# Patient Record
Sex: Male | Born: 1950 | Race: Black or African American | Hispanic: No | Marital: Single | Smoking: Never smoker
Health system: Southern US, Community
[De-identification: ages and names within clinical notes are randomized; demographics above are authoritative.]

## PROBLEM LIST (undated history)

## (undated) DIAGNOSIS — I10 Essential (primary) hypertension: Secondary | ICD-10-CM

## (undated) DIAGNOSIS — K219 Gastro-esophageal reflux disease without esophagitis: Secondary | ICD-10-CM

## (undated) DIAGNOSIS — E559 Vitamin D deficiency, unspecified: Secondary | ICD-10-CM

## (undated) DIAGNOSIS — F329 Major depressive disorder, single episode, unspecified: Secondary | ICD-10-CM

## (undated) DIAGNOSIS — F32A Depression, unspecified: Secondary | ICD-10-CM

## (undated) DIAGNOSIS — N189 Chronic kidney disease, unspecified: Secondary | ICD-10-CM

## (undated) DIAGNOSIS — R1310 Dysphagia, unspecified: Secondary | ICD-10-CM

## (undated) DIAGNOSIS — I639 Cerebral infarction, unspecified: Secondary | ICD-10-CM

## (undated) DIAGNOSIS — G10 Huntington's disease: Secondary | ICD-10-CM

## (undated) DIAGNOSIS — T7840XA Allergy, unspecified, initial encounter: Secondary | ICD-10-CM

## (undated) DIAGNOSIS — E785 Hyperlipidemia, unspecified: Secondary | ICD-10-CM

## (undated) HISTORY — DX: Gastro-esophageal reflux disease without esophagitis: K21.9

## (undated) HISTORY — DX: Essential (primary) hypertension: I10

## (undated) HISTORY — DX: Major depressive disorder, single episode, unspecified: F32.9

## (undated) HISTORY — DX: Vitamin D deficiency, unspecified: E55.9

## (undated) HISTORY — DX: Dysphagia, unspecified: R13.10

## (undated) HISTORY — DX: Allergy, unspecified, initial encounter: T78.40XA

## (undated) HISTORY — DX: Hyperlipidemia, unspecified: E78.5

## (undated) HISTORY — DX: Chronic kidney disease, unspecified: N18.9

## (undated) HISTORY — DX: Cerebral infarction, unspecified: I63.9

## (undated) HISTORY — DX: Huntington's disease: G10

## (undated) HISTORY — DX: Depression, unspecified: F32.A

---
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
## 2009-09-24 LAB — CONVERTED CEMR LAB
ALT: 17 units/L
Albumin: 4.2 g/dL
CO2: 26 meq/L
Calcium: 9.3 mg/dL
Creatinine, Ser: 1.14 mg/dL
HDL: 68 mg/dL
Total Bilirubin: 0.4 mg/dL
Triglyceride fasting, serum: 93 mg/dL

## 2009-11-11 ENCOUNTER — Encounter: Admission: RE | Admit: 2009-11-11 | Discharge: 2009-11-11 | Payer: Self-pay | Admitting: Family Medicine

## 2009-11-11 LAB — CONVERTED CEMR LAB
Hemoglobin: 14.5 g/dL
WBC: 4.9 10*3/uL

## 2009-11-21 ENCOUNTER — Encounter: Payer: Self-pay | Admitting: Internal Medicine

## 2009-11-21 LAB — HM COLONOSCOPY: HM Colonoscopy: NORMAL

## 2009-12-06 ENCOUNTER — Ambulatory Visit: Payer: Self-pay | Admitting: Internal Medicine

## 2009-12-06 DIAGNOSIS — K1120 Sialoadenitis, unspecified: Secondary | ICD-10-CM

## 2009-12-06 DIAGNOSIS — R197 Diarrhea, unspecified: Secondary | ICD-10-CM

## 2009-12-06 DIAGNOSIS — K219 Gastro-esophageal reflux disease without esophagitis: Secondary | ICD-10-CM

## 2009-12-06 DIAGNOSIS — J309 Allergic rhinitis, unspecified: Secondary | ICD-10-CM | POA: Insufficient documentation

## 2009-12-06 DIAGNOSIS — G10 Huntington's disease: Secondary | ICD-10-CM

## 2010-05-21 ENCOUNTER — Ambulatory Visit: Payer: Self-pay | Admitting: Internal Medicine

## 2010-06-12 ENCOUNTER — Encounter (INDEPENDENT_AMBULATORY_CARE_PROVIDER_SITE_OTHER): Payer: Self-pay | Admitting: *Deleted

## 2010-06-12 ENCOUNTER — Ambulatory Visit: Payer: Self-pay | Admitting: Internal Medicine

## 2010-06-12 LAB — CONVERTED CEMR LAB
ALT: 25 units/L (ref 0–53)
AST: 24 units/L (ref 0–37)
BUN: 15 mg/dL (ref 6–23)
Bilirubin, Direct: 0.1 mg/dL (ref 0.0–0.3)
Calcium: 9.5 mg/dL (ref 8.4–10.5)
Creatinine, Ser: 1.3 mg/dL (ref 0.4–1.5)
Eosinophils Relative: 2.6 % (ref 0.0–5.0)
GFR calc non Af Amer: 70.22 mL/min (ref >60)
Monocytes Relative: 11.4 % (ref 3.0–12.0)
Neutrophils Relative %: 43.5 % (ref 43.0–77.0)
Platelets: 220 10*3/uL (ref 150.0–400.0)
Total Bilirubin: 0.6 mg/dL (ref 0.3–1.2)
WBC: 3.9 10*3/uL — ABNORMAL LOW (ref 4.5–10.5)

## 2010-06-16 LAB — CONVERTED CEMR LAB: IgE (Immunoglobulin E), Serum: 332 intl units/mL — ABNORMAL HIGH (ref 0.0–180.0)

## 2010-06-17 ENCOUNTER — Telehealth: Payer: Self-pay | Admitting: Gastroenterology

## 2010-06-17 ENCOUNTER — Telehealth: Payer: Self-pay | Admitting: Internal Medicine

## 2010-06-23 ENCOUNTER — Telehealth: Payer: Self-pay | Admitting: Gastroenterology

## 2010-06-24 ENCOUNTER — Telehealth (INDEPENDENT_AMBULATORY_CARE_PROVIDER_SITE_OTHER): Payer: Self-pay | Admitting: *Deleted

## 2010-06-27 ENCOUNTER — Telehealth: Payer: Self-pay | Admitting: Gastroenterology

## 2010-06-27 ENCOUNTER — Ambulatory Visit: Payer: Self-pay | Admitting: Gastroenterology

## 2010-07-02 ENCOUNTER — Ambulatory Visit: Payer: Self-pay | Admitting: Gastroenterology

## 2010-07-04 ENCOUNTER — Telehealth: Payer: Self-pay | Admitting: Gastroenterology

## 2010-07-07 ENCOUNTER — Encounter (INDEPENDENT_AMBULATORY_CARE_PROVIDER_SITE_OTHER): Payer: Self-pay | Admitting: *Deleted

## 2010-07-08 ENCOUNTER — Encounter: Payer: Self-pay | Admitting: Gastroenterology

## 2010-07-17 ENCOUNTER — Ambulatory Visit: Payer: Self-pay | Admitting: Internal Medicine

## 2010-07-29 ENCOUNTER — Telehealth (INDEPENDENT_AMBULATORY_CARE_PROVIDER_SITE_OTHER): Payer: Self-pay | Admitting: *Deleted

## 2010-07-31 ENCOUNTER — Telehealth: Payer: Self-pay | Admitting: Internal Medicine

## 2010-08-14 ENCOUNTER — Ambulatory Visit: Payer: Self-pay | Admitting: Internal Medicine

## 2010-08-27 ENCOUNTER — Encounter: Payer: Self-pay | Admitting: Internal Medicine

## 2010-08-27 LAB — CONVERTED CEMR LAB: IgE (Immunoglobulin E), Serum: 251.3 intl units/mL — ABNORMAL HIGH (ref 0.0–180.0)

## 2010-09-15 ENCOUNTER — Telehealth: Payer: Self-pay | Admitting: Internal Medicine

## 2010-09-15 DIAGNOSIS — L600 Ingrowing nail: Secondary | ICD-10-CM

## 2010-12-23 NOTE — Progress Notes (Signed)
Summary: POA concern  Phone Note Call from Patient Call back at [phone]   Caller: cousin, Fraser Din Call For: Dr. Christella Hartigan Reason for Call: Talk to Nurse Summary of Call: cousin, Fraser Din, is in the process of applying for POA and in the meantime has asked that all appointments and notifications are made through him... Fraser Din was upset because apparently the pt was contacted about his appt today with Amy and pt is not able to comprehend this information per Myrtle... pt was A nos TODAY, have verified with Pam that Amy will not be charging pt a cx fee... Fraser Din would like "the nurse involved in sch'ling today's appt" to give him a call to clarify who the appt was made with and to resch another appt asap Initial call taken by: Vallarie Mare,  June 23, 2010 4:47 PM  Follow-up for Phone Call        [facility] informed we only gave the scheduler an appt. at the request of Dr.Leschbar and did not speak with pt.Dahlia the triage nurse contacted and she ask me to have the pt. call their office and let a message for triage nurse which I relayed to [facility]. Follow-up by: Teryl Lucy RN,  June 24, 2010 9:34 AM

## 2010-12-23 NOTE — Progress Notes (Signed)
Summary: Biopsy Results  Phone Note Call from Patient Call back at Home Phone [phone]   Caller: Eric Waters, caretaker Call For: Dr. Arlyce Dice Reason for Call: Talk to Nurse Summary of Call: would like to get EGD results Initial call taken by: Vallarie Mare,  July 04, 2010 2:00 PM  Follow-up for Phone Call        Message left for pt. that when Dr.Kaplan reviews and advises on biopsy results I will call him.  Follow-up by: Laureen Ochs LPN,  July 04, 2010 2:28 PM  Additional Follow-up for Phone Call Additional follow up Details #1::        bxs are normal.  needs f/u ov Additional Follow-up by: Louis Meckel MD,  July 07, 2010 11:54 AM    Additional Follow-up for Phone Call Additional follow up Details #2::    Above MD orders reviewed with patient's caregiver, Eric Waters. Pt. to keep scheduled office visit 08-06-10 at 11:15am. Eric Waters instructed to call back as needed.    Follow-up by: Laureen Ochs LPN,  July 07, 2010 12:12 PM

## 2010-12-23 NOTE — Letter (Signed)
Summary: [facility] Results Follow Up Letter  [facility]  [address]   [place], [place] [address]   Phone: [phone]  Fax: [phone]    08/27/2010 MRN: [id number]  Eric Waters [address] [place], [place]  [address]  Dear [name],  We have received the results from your recent tests and have been unable to contact you.  Please call our office at [phone] so that Dr.Young or his nurse may review the results with you.    Thank you,    Nature conservation officer Pulmonary Division

## 2010-12-23 NOTE — Letter (Signed)
Summary: Appt Reminder 2  [facility]  [address] [place], [place] [address]   Phone: [phone]  Fax: [phone]        July 07, 2010 MRN: [id number]    [facility] [address] [place], [place]  [address]    Dear Eric Waters,   You have a return appointment with Dr.Robert kaplan on 08-06-10 at 11:15am. Please remember to bring a complete list of the medicines you are taking, your insurance card and your co-pay.  If you have to cancel or reschedule this appointment, please call before 5:00 pm the evening before to avoid a cancellation fee.  If you have any questions or concerns, please call [phone].    Sincerely,    Laureen Ochs LPN  Appended Document: Appt Reminder 2 Letter mailed to patient's caregiver, Eric Waters.

## 2010-12-23 NOTE — Letter (Signed)
Summary: Results Letter  [facility]  [address] [place], [place] [address]   Phone: [phone]  Fax: [phone]        July 08, 2010 MRN: [id number]    Eric Waters [address] [place], [place]  [address]    Dear Mr. Greenawalt,  Your small bowel biopsy results did not show any remarkable findings. There is no evidence for celiac disease. Please continue with the recommendations previously discussed and schedule a followup office appointment.  Should you have any further questions or immediate concers, feel free to contact me.  Sincerely,  Barbette Hair. Arlyce Dice, M.D., [name]          Sincerely,  Louis Meckel MD  This letter has been electronically signed by your physician.  Appended Document: Results Letter letter mailed

## 2010-12-23 NOTE — Procedures (Signed)
Summary: Colon w BX/[facility]  Colon w BX/[facility]   Imported By: Lester Old [place] 12/09/2009 10:37:52  _____________________________________________________________________  External Attachment:    Type:   Image     Comment:   External Document

## 2010-12-23 NOTE — Assessment & Plan Note (Signed)
Summary: nausea, vomiting and diarrhea x 1 month/pl   History of Present Illness Visit Type: Follow-up Visit Primary GI MD: Melvia Heaps MD [facility] Primary Provider: Rene Paci, MD Chief Complaint: nausea, vomiting and diarrhea for one month, worse after drinking any liquid History of Present Illness:   PLEASANT 60 YO A.A MALE  REFERRED FOR CHRONIC DIARRHEA. HE HAS HUNTINGTON'S DISEASE AND IS ACCOMPANIED BY HIS COUSIN WHO IS HIS POA.Marland Kitchen HE HAD A COLONOSCOPY WITH DR. HUNG IN 12/10 FOR DIARRHEA WHICH WAS NORMAL. HE DID RANDOME BX'S -NEGATIVE.  PT IS NOT SURE WHEN HIS DIARRHEA STARTED BUT IT HAS BEEN PRESENT FOR THE BETTER PART OF A YEAR AND PROGRESSIVE. HE IS USING 4-5 IMMODIUM PER DAY AND STIIL HAS 2-3 LOOSE STOOLS PER DAY. NO BLEEDING. OVER THE PAST MONTH HAS BEN C/O ABDOMINAL CRAMPINGAND EPISODES OF NAUSEA AND VOMITING WHICH HE SEEMS TO ASSOCITE WITH DRINKINGH TOO MUCH FLUID. NO SIGNIFICANT WEIGHT LOSS.   HE SA DR LESCHBER RECENTLY WHO DID AAN ALLERGY WORKUP. HE WAS POSITIVE FOR WHEAT ,AND IGE. CELIAC PANEL WAS ORDERED. HE HAS A POSITIVE GLIADIN AB IGG-20.8,,AND GLIADIN AB IGA-24.2,CBC AND CMET UNREMARKABLE,TSH NORMAL.   GI Review of Systems    Reports abdominal pain, bloating, nausea, and  vomiting.     Location of  Abdominal pain: lower abdomen.    Denies acid reflux, belching, chest pain, dysphagia with liquids, dysphagia with solids, heartburn, loss of appetite, vomiting blood, and  weight loss.      Reports diarrhea.     Denies anal fissure, black tarry stools, change in bowel habit, constipation, diverticulosis, fecal incontinence, heme positive stool, hemorrhoids, irritable bowel syndrome, jaundice, light color stool, liver problems, rectal bleeding, and  rectal pain. Preventive Screening-Counseling & Management  Caffeine-Diet-Exercise     Does Patient Exercise: no      Drug Use:  no.      Current Medications (verified): 1)  Imodium A-D 2 Mg Tabs (Loperamide Hcl) .... Take  1-2 By Mouth Every 4-6 Hours As Needed For Diarrhea, Max 8 Tab/24h 2)  Claritin 10 Mg Tabs (Loratadine) .Marland Kitchen.. 1 By Mouth Two Times A Day 3)  St Johns Wort 150 Mg Caps ([address] Johns Wort) .... Take 1 Capsule By Mouth Once A Day 4)  Echinacea 80 Mg Caps (Echinacea) .... Take 1 Capsule By Mouth Once A Day  Allergies (verified): 1)  ! Codeine  Past History:  Past Medical History: Allergic rhinitis GERD huntington's chorea - [facility] for [facility]   MD roster - neuro - dr. Boris Sharper christine research coordinator [phone]  Past Surgical History: Tonsillectomy (1959) COLONOSCOPY 12/10 DR. HUNG NORMAL.  Family History: Maternal uncle alzheimer Maternal grandfather alzheimer Maternal grandmother hx huntington chorea Maternal uncle hx stroke Family History Lung cancer (father) No FH of Colon Cancer:  Social History: Never Smoked no alcohol use single, separated from wife since 2003 lives with friend of family moved to [place] area from [place] spring [place] to be closer to [facility] and caregivers Illicit Drug Use - no Patient does not get regular exercise.  Drug Use:  no Does Patient Exercise:  no  Review of Systems  The patient denies allergy/sinus, anemia, anxiety-new, arthritis/joint pain, back pain, blood in urine, breast changes/lumps, confusion, cough, coughing up blood, depression-new, fainting, fatigue, fever, headaches-new, hearing problems, heart murmur, heart rhythm changes, itching, muscle pains/cramps, night sweats, nosebleeds, shortness of breath, skin rash, sleeping problems, sore throat, swelling of feet/legs, swollen lymph glands, thirst - excessive, urination - excessive, urination  changes/pain, urine leakage, vision changes, and voice change.         SEE HPI  Vital Signs:  Patient profile:   60 year old male Height:      72 inches Weight:      172 pounds BMI:     23.41 Pulse rate:   76 / minute Pulse rhythm:   regular BP sitting:   114 / 66  (left arm) Cuff  size:   regular  Vitals Entered By: Francee Piccolo CMA Duncan Dull) (June 27, 2010 3:45 PM)  Physical Exam  General:  Well developed, well nourished, no acute distress.THIN WITH MOVEMENT DISORDER Head:  Normocephalic and atraumatic. Eyes:  PERRLA, no icterus. Lungs:  Clear throughout to auscultation. Heart:  Regular rate and rhythm; no murmurs, rubs,  or bruits. Abdomen:  SOFT, NONTENDER, NO MASS OR HSM,BS+ Rectal:  NOT DONE Extremities:  No clubbing, cyanosis, edema or deformities noted. Neurologic:  Alert and  oriented x4; WITH MOVEMENT DISORDER Psych:  Alert and cooperative. Normal mood and affect.   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 R/O CELIAC DISEASE.  + GLIADIN AB'S,NEG TTG, + WHEAT SENSITIVITY WITH FOOD ALLERGEN PROFILE.  CONTINUE IMMODIUM AS NEEDED ADD BENTYL 10 MG 3 X DAILY SCHEDULE FOR EGD WITH SMALL BOWEL BX'S WITH DR. KAPLAN. PROCEDURE DISCUSSED IN DETAIL WITH PT AND HIS POA.   Orders: EGD (EGD)  Patient Instructions: 1)  We scheduled the Endoscopy with Dr. Arlyce Dice for 07-02-10. 2)  Directions and brochure provided. 3)  [facility] Patient Information Guide given to patient. 4)  We sent a  prescriptions for Bentyl 10 mg . 5)  Continue Imodium as needed. 6)  Copy sent to : Otho Najjar, MD 7)  The medication list was reviewed and reconciled.  All changed / newly prescribed medications were explained.  A complete medication list was provided to the patient / caregiver. Prescriptions: BENTYL 10 MG CAPS (DICYCLOMINE HCL) Take 1 tab 3 times daily as needed for cramping  #60 x 1   Entered by:   Lowry Ram NCMA   Authorized by:   Sammuel Cooper PA-c   Signed by:   Lowry Ram NCMA on 06/27/2010   Method used:   Electronically  to        [facility]  [phone]* (retail)       [address]       [place], [place]  [address]       Ph: [phone] or [phone]       Fax: [phone]   RxID:   [id number]

## 2010-12-23 NOTE — Progress Notes (Signed)
Summary: PA in singulair - may have samples?/PA initiated - form faxed   Phone Note Call from Patient Call back at [phone]   Caller: POA//preston mcneil Call For: young Reason for Call: Refill Medication Summary of Call: Needs generic for singulair, states pt has been out for about 4 days, can also be reached at [phone]. Initial call taken by: Darletta Moll,  July 31, 2010 2:45 PM  Follow-up for Phone Call        called spoke with preston who states that he spoke with Fraser Din who states that the singulair needs a PA and wonders if this has been done.  Lawson Fiscal has PA.    Dr. Maple Hudson, may have samples until the PA is done? Boone Master CNA/MA  July 31, 2010 3:05 PM   PA initiated for Singulair. Alternative choices are Accolate or Zafirlukast. PA form recieved and given to [facility]. Follow-up by: Michel Bickers CMA,  July 31, 2010 3:30 PM  Additional Follow-up for Phone Call Additional follow up Details #1::        Spoke with Fraser Din and made aware that PA has been initiated and that we are awaiting determination letter from ins co.  Fraser Din verbalized understanding. I left a few more samples up front to last until this is done. Additional Follow-up by: Vernie Murders,  August 06, 2010 2:06 PM    Additional Follow-up for Phone Call Additional follow up Details #2::    form filled out by [facility] and faxed back to [facility].  will hold in Katie's inbox for follow up. Boone Master CNA/MA  August 06, 2010 4:35 PM   Received determination letter from [facility].  Singular has been denied.  Appeal letter placed on CYs cart for review.  Will forward message to him so he is aware.  Gweneth Dimitri RN  August 07, 2010 2:31 PM  Dr. Maple Hudson, do you still have the appeal form that Crystal placed on your cart? Singulair was denied but pt's insurance will cover Accolate or Zafirlukast. Would one of the alternatives be appropriate for this patient?Michel Bickers [facility]  August 12, 2010 11:24  AM   Additional Follow-up for Phone Call Additional follow up Details #3:: Details for Additional Follow-up Action Taken: We will try Accolate Additional Follow-up by: Waymon Budge MD,  August 14, 2010 1:07 PM

## 2010-12-23 NOTE — Assessment & Plan Note (Signed)
Summary: NEW MEDICARE PT-LOW ENERGY-DIARRHEA-PKG/OFF--STC   Vital Signs:  Patient profile:   60 year old male Height:      72 inches (182.88 cm) Weight:      178.0 pounds (80.91 kg) BMI:     24.23 O2 Sat:      98 % on Room air Temp:     98.9 degrees F (37.17 degrees C) oral Pulse rate:   64 / minute BP sitting:   122 / 80  (left arm) Cuff size:   regular  Vitals Entered By: Orlan Leavens (December 06, 2009 11:02 AM)  O2 Flow:  Room air CC: New patient Is Patient Diabetic? No   Primary Care Provider:  Newt Lukes MD  CC:  New patient.  History of Present Illness: new pt to me and our practice - here to est care prev followed at [facility] - has brought his records with im today here with "caregiver" friend today  1) huntington's dz - follows [facility] for same (dr. Thelma Barge walker) - visits every4-12 weeks now in "3rd phase" of trial and getting "the real medication" per his caregiver/friend feels symptoms initially improved on tx , then worse again -  2) diarrhea - s/p colo and GI eval for same - 11/25/09 path unremarkable has resolved with immodium use but ?how long he should use it (not addressed by dr. hung)  3) seasonal allg - uses zyrtec as needed for same -  symptoms not bad now -  4) concern with recuurent "saliva gland infections" under his neck - last event treated with zpack, prev with PCN -  infections occurs 1-2x/year - worried symptoms coming back sooner than before because did not have "enough zpack pills" no fever, no trouble swallowing, no sore throat - +dry mouth     Preventive Screening-Counseling & Management  Alcohol-Tobacco     Alcohol drinks/day: 0     Alcohol Counseling: not indicated; patient does not drink     Smoking Status: never     Tobacco Counseling: not indicated; no tobacco use  Caffeine-Diet-Exercise     Does Patient Exercise: yes     Exercise Counseling: not indicated; exercise is adequate     Depression  Counseling: not indicated; screening negative for depression  Clinical Review Panels:  Prevention   Last Colonoscopy:  Done @ [facility] Impression: Normal colon Results: Hemorrhoids.      (11/21/2009)  Immunizations   Last Tetanus Booster:  Historical (09/23/2009)   Last Flu Vaccine:  Historical (09/23/2009)   Last Pneumovax:  Historical (09/23/2009)  Lipid Management   Cholesterol:  216 (09/24/2009)   LDL (bad choesterol):  129 (09/24/2009)   HDL (good cholesterol):  68 (09/24/2009)   Triglycerides:  93 (09/24/2009)  CBC   WBC:  4.9 (11/11/2009)   Hgb:  14.5 (11/11/2009)   Platelets:  222 (11/11/2009)  Complete Metabolic Panel   Glucose:  79 (09/24/2009)   Sodium:  140 (09/24/2009)   Potassium:  4.3 (09/24/2009)   Chloride:  101 (09/24/2009)   CO2:  26 (09/24/2009)   BUN:  11 (09/24/2009)   Creatinine:  1.14 (09/24/2009)   Albumin:  4.2 (09/24/2009)   Total Protein:  6.9 (09/24/2009)   Calcium:  9.3 (09/24/2009)   Total Bili:  0.4 (09/24/2009)   Alk Phos:  78 (09/24/2009)   SGPT (ALT):  17 (09/24/2009)   SGOT (AST):  24 (09/24/2009)   -  Date:  11/11/2009    WBC: 4.9  HGB: 14.5    PLT: 222  Date:  09/24/2009    BG Random: 79    BUN: 11    Creatinine: 1.14    Sodium: 140    Potassium: 4.3    Chloride: 101    CO2 Total: 26    Calcium: 9.3    SGOT (AST): 24    SGPT (ALT): 17    T. Bilirubin: 0.4    Alk Phos: 78    Total Protein: 6.9    Albumin: 4.2    Cholesterol: 216    LDL: 129    LDL-calculated: 19    HDL: 68    Triglycerides: 93    TSH: 1.310  Current Medications (verified): 1)  Research Drug 2)  Zyrtec Allergy 10 Mg Tabs (Cetirizine Hcl) .... Take 1 By Mouth Once Daily Prn 3)  Fluticasone Propionate 50 Mcg/act Susp (Fluticasone Propionate) .... 2 Spray Each Nostril At Bedtime  Allergies (verified): 1)  ! Codeine  Past History:  Past Medical History: Allergic rhinitis GERD huntington's chorea - [facility] at [facility]  for same   MD [place]oster - neuro - dr. Boris Sharper christine research coordinator [phone] GI - hung  Past Surgical History: Tonsillectomy (1959)  Family History: Maternal uncle alzheimer Maternal grandfather alzheimer Maternal grandmother hx huntington chorea Maternal uncle hx stroke Family History Lung cancer (father)  Social History: Never Smoked no alcohol use single, separated from wife since 2003 lives with friend of family moved to [place] area from [place] spring 2010 to be closer to [facility] and caregivers Smoking Status:  never Does Patient Exercise:  yes  Review of Systems       see HPI above. I have reviewed all other systems and they were negative.   Physical Exam  General:  alert, well-developed, well-nourished, and cooperative to examination.   accompanied by caregiver friend Eyes:  vision grossly intact; pupils equal, round and reactive to light.  conjunctiva and lids normal.    Ears:  normal pinnae bilaterally, without erythema, swelling, or tenderness to palpation. TMs clear, without effusion, or cerumen impaction. Hearing grossly normal bilaterally  Mouth:  teeth and gums in good repair; mucous membranes moist, without lesions or ulcers. oropharynx clear without exudate, mild erythema. no purulence expressed from salivary glands to palp and manipulation Lungs:  normal respiratory effort, no intercostal retractions or use of accessory muscles; normal breath sounds bilaterally - no crackles and no wheezes.    Heart:  normal rate, regular rhythm, no murmur, and no rub. BLE without edema.  Msk:  no gross deformities Neurologic:  typical chorea uncontrolled mvmts, constant activity involving upper body and head/neck more than trunk. alert & oriented X3.  fluent thought process but speech affected by movement disorder. gait is independant but careful Skin:  no rashes, vesicles, ulcers, or erythema. No nodules or irregularity to palpation.  Psych:  Oriented X3, memory  intact for recent and remote, normally interactive, good eye contact, not anxious appearing, not depressed appearing, and not agitated.      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 Take 1 by mouth once daily prn    Fluticasone Propionate 50 Mcg/act Susp (Fluticasone propionate) .Marland Kitchen... 2 spray each nostril at bedtime  Problem # 5:  GERD (ICD-530.81) Time spent with patient and caregiver 45 minutes, more than 50% of this time was spent counseling patient on causes of salvary gland irritation and reviewing records from former provider, incorporation of same into emr.  Complete Medication List: 1)  Research Drug  2)  Zyrtec Allergy 10 Mg Tabs (Cetirizine hcl) .... Take 1 by mouth once daily prn 3)  Fluticasone Propionate 50 Mcg/act Susp (Fluticasone propionate) .... 2 spray each nostril at bedtime 4)  Azithromycin 250 Mg Tabs (Azithromycin) .... 2 tabs by mouth today, then 1 by mouth daily starting tomorrow   Patient Instructions: 1)  it was good to see you today.  2)  antibiotic presciption to be used as needed as discussed - call if problems 3)  may use immodium as needed per dr. hung but do not use if bowels are formed or if you are not having any  movements as it can cause constipation if used in excess 4)  will check with christine at [facility] to see what information can be shared about your study participation 5)  Please schedule a follow-up appointment in 3-38months, sooner if problems.  Prescriptions: AZITHROMYCIN 250 MG TABS (AZITHROMYCIN) 2 tabs by mouth today, then 1 by mouth daily starting tomorrow  #6 x 0   Entered and Authorized by:   Newt Lukes MD   Signed by:   Newt Lukes MD on 12/06/2009   Method used:   Print then Give to Patient   RxID:   [id number]    [place] of Abdomen  Procedure date:  11/11/2009  Findings:      Done @ [facility] Impression: Benign-appearing abdomen. Stool scattered throughtout the nondistended.  Colonoscopy  Procedure date:  11/21/2009  Findings:      Done @ [facility] Impression: Normal colon Results: Hemorrhoids.       Colposcopy Biopsy Results  Procedure date:  11/21/2009  Findings:      normal:      Immunization History:  Tetanus/Td Immunization History:    Tetanus/Td:  historical (09/23/2009)  Pneumovax Immunization History:    Pneumovax:  historical (09/23/2009)  Influenza Immunization History:    Influenza:  historical (09/23/2009)

## 2010-12-23 NOTE — Letter (Signed)
Summary: [facility] Consult Scheduled Letter  [facility]  [address] [place], [place] [address]   Phone: [phone]  Fax: [phone]      06/12/2010 MRN: [id number]  JACHAI OKAZAKI [address] [place], [place]  [address]    Dear Mr. Zuelke,      We have scheduled an appointment for you.  At the recommendation of Dr.Leschber, we have scheduled you a consult with Dr.Young ( [facility]) on Aug 25,2011 at 3:00PM /arrive 2:45pm.Their phone number is [phone].  If this appointment day and time is not convenient for you, please feel free to call the office of the doctor you are being referred to at the number listed above and reschedule the appointment.  PLEASE GIVE A 24 NOTICE IF YOU NEED TO CANCEL /RESCHEDULE TO AVOID A $50.00 FEE. ALSO BRING INSURANCE CARD AND ANY CO-PAY DUE AT TIME OF VISIT   [facility] Pulmonary 2nd floor [address] [place] , [place] [address]  Thank you,  Patient Care Coordinator [facility]

## 2010-12-23 NOTE — Progress Notes (Signed)
Summary: singulair rx sent  Phone Note Call from Patient   Caller: preston ( caregiver Call For: young Summary of Call: pt had samples only would like singulair prescript for 90 days [facility] Initial call taken by: Rickard Patience,  July 29, 2010 2:28 PM  Follow-up for Phone Call        Spoke with pt's caregiver [place].  He states that pt's allergy symptoms have improved alot since starting singulair. Would like rx sent asap as he is only in town for 1 hr.  I advised rx has been sent.  Follow-up by: Vernie Murders,  July 29, 2010 2:37 PM    New/Updated Medications: SINGULAIR 10 MG TABS (MONTELUKAST SODIUM) 1 by mouth every evening Prescriptions: SINGULAIR 10 MG TABS (MONTELUKAST SODIUM) 1 by mouth every evening  #90 x 3   Entered by:   Vernie Murders   Authorized by:   Waymon Budge MD   Signed by:   Vernie Murders on 07/29/2010   Method used:   Electronically to        [facility]  [phone]* (retail)       [address]       [place], [place]  [address]       Ph: [phone] or [phone]       Fax: [phone]   RxID:   [id number]

## 2010-12-23 NOTE — Progress Notes (Signed)
Summary: Appt  Phone Note Call from Patient   Caller: Eric Waters [phone] Summary of Call: [facility] Eric Maxwell RN called to inform our office that pt No-Showed for scg appt 08/01. Per [facility] and notes left in referral order pt was contacted about referral and messages were left. The number in EMR for the pt; [phone]. Per Eric Waters (designated party release form 06/12/2010) this is the patient's phone number and pt is not mentally/emotionally competent to deal with medical needs. Per Eric Waters he advised GI and PC that he is the only one to be notified about pt appts and results (this is not indicated on DPR nor is Mr. Mickie Kay phone number listed). Eric Waters was advised that we contacted pt about appt using the phone number listed in EMR. I also advised that I will change phone number in EMR, advise GI to refer to Designated Party Release and request [facility] reschedule appt for pt. Initial call taken by: Eric Waters, CMA,  June 24, 2010 10:06 AM  Follow-up for Phone Call        called pt cousin MR preston back he stated he spoke with [facility] and the nurse will give him a call back to schedule the pt Eric Waters  June 24, 2010 10:31 AM

## 2010-12-23 NOTE — Letter (Signed)
Summary: EGD Instructions  [facility]  [address] [place], [place] [address]   Phone: [phone]  Fax: [phone]       Eric Waters    [birth year]    MRN: [id number]       Procedure Day /Date: 07-02-10     Arrival Time: 2:00 PM      Procedure Time: 1:00 PM     Location of Procedure:                    X     [facility] (4th Floor)  PREPARATION FOR ENDOSCOPY   On 8-10-11THE DAY OF THE PROCEDURE:  1.   No solid foods, milk or milk products are allowed after midnight the night before your procedure.  2.   Do not drink anything colored red or purple.  Avoid juices with pulp.  No orange juice.  3.  You may drink clear liquids until1:00 PM , which is 2 hours before your procedure.                                                                                                CLEAR LIQUIDS INCLUDE: Water Jello Ice Popsicles Tea (sugar ok, no milk/cream) Powdered fruit flavored drinks Coffee (sugar ok, no milk/cream) Gatorade Juice: apple, white grape, white cranberry  Lemonade Clear bullion, consomm, broth Carbonated beverages (any kind) Strained chicken noodle soup Hard Candy   MEDICATION INSTRUCTIONS  Unless otherwise instructed, you should take regular prescription medications with a small sip of water as early as possible the morning of your procedure.         OTHER INSTRUCTIONS  You will need a responsible adult at least 60 years of age to accompany you and drive you home.   This person must remain in the waiting room during your procedure.  Wear loose fitting clothing that is easily removed.  Leave jewelry and other valuables at home.  However, you may wish to bring a book to read or an iPod/MP3 player to listen to music as you wait for your procedure to start.  Remove all body piercing jewelry and leave at home.  Total time from sign-in until discharge is approximately 2-3 hours.  You should go home directly after your  procedure and rest.  You can resume normal activities the day after your procedure.  The day of your procedure you should not:   Drive   Make legal decisions   Operate machinery   Drink alcohol   Return to work  You will receive specific instructions about eating, activities and medications before you leave.    The above instructions have been reviewed and explained to me by   _______________________    I fully understand and can verbalize these instructions _____________________________ Date _________

## 2010-12-23 NOTE — Assessment & Plan Note (Signed)
Summary: diarrhea-lb   Vital Signs:  Patient profile:   60 year old male Height:      72 inches (182.88 cm) Weight:      179.8 pounds (81.73 kg) O2 Sat:      98 % on Room air Temp:     97.3 degrees F (36.28 degrees C) oral Pulse rate:   64 / minute BP sitting:   130 / 72  (left arm) Cuff size:   regular  Vitals Entered By: Orlan Leavens (June 12, 2010 9:22 AM)  O2 Flow:  Room air CC: diarrhea, Diarrhea Is Patient Diabetic? No Pain Assessment Patient in pain? no      Comments Pt brother concern about him taking claritin 1 five times a day   Primary Care Provider:  Newt Lukes MD  CC:  diarrhea and Diarrhea.  History of Present Illness: here with "caregiver" friend today Diarrhea      This is a 60 year old man who presents with Diarrhea.  The symptoms began 2 weeks ago.  The severity is described as moderate.  chronic intermittent symptoms for years, usually 4-6 flares yearly. last "flare" 10/2009 and stable until recently (start of 05/2010).  The patient reports 4-6 stools per day, watery/unformed stools, voluminous stools, fecal urgency, and abrupt onset of symptoms, but denies semiformed/loose stools, blood in stool, mucus in stool, greasy stools, malodorous stools, fecal soiling, alternating diarrhea/constipation, nocturnal diarrhea, bloating, and gassiness.  The patient denies fever, abdominal pain, abdominal cramps, nausea, vomiting, lightheadedness, weight loss, joint pains, mouth ulcers, and eye redness.  The symptoms are worse with any food.  The symptoms are better with hypomotility agents.  Patient denies history of irritable bowel syndrome, ulcerative colitis, Crohn's disease, ischemic bowel disease, radiation therapy, colon cancer, laxative use, diabetes, hyperthyroidism, and adrenal insufficiency.     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 feels symptoms initially improved on tx , then worse again -  2) diarrhea - see above s/p colo and GI eval for same - 11/25/09 path unremarkable has improved with immodium use but ?how long he should use it (not addressed by dr. hung)  3) seasonal allg - uses zyrtec as needed for same -  symptoms "bad" now -  4) concern with recurrent "saliva gland infections" under his neck - last event treated with zpack, prev with PCN -  infections occurs 1-2x/year - worried symptoms coming back sooner than before because did not have "enough zpack pills" no fever, no trouble swallowing, no sore throat - +dry mouth    Clinical Review Panels:  Lipid Management   Cholesterol:  216 (09/24/2009)   LDL (bad choesterol):  129 (09/24/2009)   HDL (good cholesterol):  68 (09/24/2009)   Triglycerides:  93 (09/24/2009)  CBC   WBC:  4.9 (11/11/2009)   Hgb:  14.5 (11/11/2009)   Platelets:  222 (11/11/2009)  Complete Metabolic Panel   Glucose:  79 (09/24/2009)   Sodium:  140 (09/24/2009)   Potassium:  4.3 (09/24/2009)   Chloride:  101 (09/24/2009)   CO2:  26 (09/24/2009)   BUN:  11 (09/24/2009)   Creatinine:  1.14 (09/24/2009)   Albumin:  4.2 (09/24/2009)   Total Protein:  6.9 (09/24/2009)   Calcium:  9.3 (09/24/2009)   Total Bili:  0.4 (09/24/2009)   Alk Phos:  78 (09/24/2009)   SGPT (ALT):  17 (09/24/2009)  SGOT (AST):  24 (09/24/2009)   Current Medications (verified): 1)  Research Drug 2)  Imodium A-D 2 Mg Tabs (Loperamide Hcl) .... Take 1 Four Times A Day  Allergies (verified): 1)  ! Codeine  Past History:  Past medical, surgical, family and social histories (including risk factors) reviewed, and no changes noted (except as noted below).  Past Medical History: Allergic rhinitis GERD huntington's chorea - [facility] at [facility] for same   MD roster - neuro - dr. Boris Sharper christine research coordinator [phone] GI - hung  Past Surgical History: Reviewed  history from 12/06/2009 and no changes required. Tonsillectomy (1959)  Family History: Reviewed history from 12/06/2009 and no changes required. Maternal uncle alzheimer Maternal grandfather alzheimer Maternal grandmother hx huntington chorea Maternal uncle hx stroke Family History Lung cancer (father)  Social History: Reviewed history from 12/06/2009 and no changes required. Never Smoked no alcohol use single, separated from wife since 2003 lives with friend of family moved to [facility] area from [place] [facility] to [facility] and caregivers  Review of Systems  The patient denies fever, weight loss, abdominal pain, melena, and hematochezia.         otherwise, see HPI above. I have reviewed all other systems and they were negative.   Physical Exam  General:  alert, well-developed, well-nourished, and cooperative to examination. nontoxic.  accompanied by caregiver friend Lungs:  normal respiratory effort, no intercostal retractions or use of accessory muscles; normal breath sounds bilaterally - no crackles and no wheezes.    Heart:  normal rate, regular rhythm, no murmur, and no rub. BLE without edema.  Abdomen:  soft, non-tender, normal bowel sounds, no distention; no masses and no appreciable hepatomegaly or splenomegaly.     Impression & Recommendations:  Problem # 1:  DIARRHEA (ICD-787.91) pt reports this is seasonal and allergy related - pt caregiver/asst feels this is chronic issue - pt taking 2-4 immodium daily to maintain 3-4 large volume liquid stools -  fits chronic diarrhea pattern -prior GI eval 10/2009 with neg colo and bx, path reviewed - no colitis no "red flags" on hx or exam - order tests now - look for celiac, carcinoid, occult infx or other metabloic dysfx -  rec trial lactose free diet - consider re-eval by GI for ?IBS vs allergist (as pt believes this is cause) depending on labs ans symptoms ok to cont Immodium and rec probiotic daily - rx provided for  same Time spent with patient/caregiver -40 minutes, more than 50% of this time was spent counseling patient on diarrhea eval, mgmt and allergy symptoms  His updated medication list for this problem includes:    Imodium A-d 2 Mg Tabs (Loperamide hcl) .Marland Kitchen... Take 1-2 by mouth every 4-6 hours as needed for diarrhea, max 8 tab/24h    Align Caps (Probiotic product) .Marland Kitchen... 1 by mouth once daily  Orders: TLB-BMP (Basic Metabolic Panel-BMET) (80048-METABOL) TLB-CBC Platelet - w/Differential (85025-CBCD) TLB-Hepatic/Liver Function Pnl (80076-HEPATIC) TLB-TSH (Thyroid Stimulating Hormone) (84443-TSH) T-Food Allergy Profile Specific IgE (86003/[address]) T-Urine 24 Hr. 5 HIAA (51761-60737) T-Stool Fats [place] Stain [id number]) T-Stool Giardia / Crypto- EIA (62703) T-Stool for O&P (50093-81829) T-Culture, C-Diff Toxin A/B (93716-96789) T-Celiac Disease Ab Evaluation (8002)  Problem # 2:  ALLERGIC RHINITIS (ICD-477.9)  pt request allg eval - will refer for same  The following medications were removed from the medication list:    Zyrtec Allergy 10 Mg Tabs (Cetirizine hcl) .Marland Kitchen... Take 1 by mouth once daily prn    Fluticasone  Propionate 50 Mcg/act Susp (Fluticasone propionate) .Marland Kitchen... 2 spray each nostril at bedtime His updated medication list for this problem includes:    Claritin 10 Mg Tabs (Loratadine) .Marland Kitchen... 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 Take 1-2 by mouth every 4-6 hours as needed for diarrhea, max 8 tab/24h 2)  Claritin 10 Mg Tabs (Loratadine) .Marland Kitchen.. 1 by mouth two times a day 3)  Align Caps (Probiotic product) .Marland Kitchen.. 1 by mouth once daily  Patient Instructions: 1)  it was good to see  you today. 2)  test(s) ordered today - your results will be posted on the phone tree for review in 48-72 hours from the time of test completion; call [phone] and enter your 9 digit MRN (listed above on this page, just below your name); if any changes need to be made or there are abnormal results, you will be contacted directly.  3)  Ok to try lactose free diet (no dairy products) 4)  if we are unable to find cause for diarrhea with these tests we may need allergist or repeat GI evaluation for input - 5)  immodium, claritin and Align as discussed - your prescriptions have been printed and given to you to submit to your pharmacy. Please take as directed. Contact our office if you believe you're having problems with the medication(s).  6)  Please schedule a follow-up appointment as needed. Prescriptions: IMODIUM A-D 2 MG TABS (LOPERAMIDE HCL) take 1-2 by mouth every 4-6 hours as needed for diarrhea, max 8 tab/24h  #120 x 5   Entered and Authorized by:   Newt Lukes MD   Signed by:   Newt Lukes MD on 06/12/2010   Method used:   Print then Give to Patient   RxID:   [id number] ALIGN  CAPS (PROBIOTIC PRODUCT) 1 by mouth once daily  #30 x 6   Entered and Authorized by:   Newt Lukes MD   Signed by:   Newt Lukes MD on 06/12/2010   Method used:   Print then Give to Patient   RxID:   [id number] CLARITIN 10 MG TABS (LORATADINE) 1 by mouth two times a day  #60 x 11   Entered and Authorized by:   Newt Lukes MD   Signed by:   Newt Lukes MD on 06/12/2010   Method used:   Print then Give to Patient   RxID:   [id number]

## 2010-12-23 NOTE — Assessment & Plan Note (Signed)
Summary: rov/ mbw   Copy to:  Rene Paci, MD Primary Jospeh Mangel/Referring Tatem Fesler:  Rene Paci, MD  CC:  3 week rov - feeling better - c/o sorthroat - feels like neck glands are swollen - denies sinus drainage or cough - prior auth for singulair was denies - may need replacement.  History of Present Illness: History of Present Illness: July 17, 2010- 57 yoM in company of cousin/ caregiver Tori Milks, seen on kind referral by Dr Felicity Coyer for allergy evaluation. 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  Never smoker, complainsof perennial sneezing, hackingup phlegm, postnasal drip. Ears may itch. Onset over 30 years ago. Worse in Spring and Fall. IgE was 332. He has been taking 5 Claritin daily on long term basis, and he asks if postnasal drip may be causing diarrhea. I pointed out that high dose antihistamines may cause GI upset. Denies hx asthma or hives/ atopic dermatitis. Denies sensitivity to insect stings, latex, contrast or aspirin. Sister and children have seasonal allergies.  August 14, 2010- rhintis, postnasal drip, Huntington's Chorea............cousin here We had him back off on the heavy use of Claritin and to try allegra, singulair and Patanase.  Insurance wouldn't cover singulair but apparently will cover Accolate. He isn't sure if Singulair samples helped. Complains of intermitent and variable throat irritation and swelling he interpets as "really bad infection". He denies reflux symptoms, has felt hot and thought his throat was swollen. Didn't try nose spray. His primary comcerns are the sense that he has a lot of postnasal drip and that his throat feels irritated. Dr Arlyce Dice did GI bx's and did not find celiac disease.   Preventive Screening-Counseling & Management  Alcohol-Tobacco     Smoking Status: never  Current Medications (verified): 1)  Imodium A-D 2 Mg Tabs  (Loperamide Hcl) .... Take 1-2 By Mouth Every 4-6 Hours As Needed For Diarrhea, Max 8 Tab/24h 2)  Allegra Allergy 180 Mg Tabs (Fexofenadine Hcl) .... Take One By Mouth Once Daily 3)  Echinacea 80 Mg Caps (Echinacea) .... Take 1 Capsule By Mouth Once A Day 4)  Bentyl 10 Mg Caps (Dicyclomine Hcl) .... Take 1 Tab 3 Times Daily As Needed For Cramping 5)  Singulair 10 Mg Tabs (Montelukast Sodium) .Marland Kitchen.. 1 By Mouth Every Evening  Allergies (verified): 1)  ! Codeine  Comments:  Nurse/Medical Assistant: The patient's medications and allergies were reviewed with the patient and were updated in the Medication and Allergy Lists.  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 Boris Sharper christine research coordinator [phone]  Past Surgical History: Last updated: 06/27/2010 Tonsillectomy (1959) COLONOSCOPY 12/10 DR. HUNG NORMAL.  Family History: Last updated: 06/27/2010 Maternal uncle alzheimer Maternal grandfather alzheimer Maternal grandmother hx huntington chorea Maternal uncle hx stroke Family History Lung cancer (father) No FH of Colon Cancer:  Social History: Last updated: 07/17/2010 Never Smoked no alcohol use single, separated from wife since 2003 lives with friend of family moved to [facility] area from [place] spring 2010 to be closer to [facility] and caregivers Illicit Drug Use - no Patient does not get regular exercise.   Risk Factors: Alcohol Use: 0 (12/06/2009) Exercise: no (06/27/2010)  Risk Factors: Smoking Status: never (08/14/2010)  Review of Systems      See HPI       The patient complains  of sore throat.  The patient denies shortness of breath with activity, shortness of breath at rest, productive cough, non-productive cough, coughing up blood, chest pain, irregular heartbeats, acid heartburn, indigestion, loss of appetite, weight change,  abdominal pain, difficulty swallowing, tooth/dental problems, headaches, nasal congestion/difficulty breathing through nose, and sneezing.    Vital Signs:  Patient profile:   60 year old male Weight:      174 pounds O2 Sat:      98 % on Room air Pulse rate:   75 / minute BP sitting:   118 / 62  (left arm) Cuff size:   regular  Vitals Entered By: Randell Loop CMA (August 14, 2010 12:18 PM)  O2 Flow:  Room air  Physical Exam  Additional Exam:  General: A/Ox3; pleasant and cooperative, NAD, SKIN: no rash, lesions NODES: no lymphadenopathy HEENT: [place]/AT, EOM- WNL, Conjuctivae- clear, PERRLA, TM-WNL, Nose- clear, Throat- clear and wnl, Mallampati II not red, not hoarse, no drainage or exudate, speech is clear NECK: Supple w/ fair ROM, JVD- none, normal carotid impulses w/o bruits Thyroid- normal to palpation. I don't feel nodes or "swelling" CHEST: Clear to P&A HEART: RRR, no m/g/r heard ABDOMEN: Soft and nl;  ZOX:WRUE, nl pulses, no edema  NEURO: Grossly writhing choreaform movement continuously.      Impression & Recommendations:  Problem # 1:  ALLERGIC RHINITIS (ICD-477.9)  We will set up a schedule to try the antiallergy meds in such a way as to hopefully make it easier to tell if something is helping. His insurance prefers Accolate so we will use that instead of Singulair. The symptoms are subjective and it isn't easy to be sure what is helping. I don't know if his persistent writhing and head turning could be chafing the inside of his throat. His updated medication list for this problem includes:    Allegra Allergy 180 Mg Tabs (Fexofenadine hcl) .Marland Kitchen... Take one by mouth once daily Flu vax  Problem # 2:  GERD (ICD-530.81) He doesn't recognize frank heartburn, but I don't think he is really able to notice low grade or persistent LPR. His updated medication list for this problem includes:    Bentyl 10 Mg Caps (Dicyclomine hcl) .Marland Kitchen... 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  Medications Added to Medication List This Visit: 1)  Allegra Allergy 180 Mg Tabs (Fexofenadine hcl) .... Take one by mouth once daily 2)  Zafirlukast 20 Mg Tabs (Zafirlukast) .Marland Kitchen.. 1 twice daily- an hour beofre or 2 hours after meals  Other Orders: T-Allergy Profile Region [place], [place], [place], [place], [place] [phone]) Est. Patient Level III [phone])  Patient Instructions: 1)  Please schedule a follow-up appointment in 2 months. 2)  Lab 3)  Try Allegra 180 mg once daily as needed 4)  Try Accolate/ zafilukast twice daily for a month 5)  Try throat lozenges and sips of liquids as you like, to soothe your throat. 6)  Flu vax Prescriptions: ZAFIRLUKAST 20 MG TABS (ZAFIRLUKAST) 1 twice daily- an hour beofre or 2 hours after meals  #60 x prn   Entered and Authorized by:   Waymon Budge MD   Signed by:   Waymon Budge MD on 08/14/2010   Method used:  Electronically to        [facility]  [phone]* (retail)       [address]       [place], [place]  [address]       Ph: [phone] or [phone]       Fax: [phone]   RxID:   [id number]

## 2010-12-23 NOTE — Progress Notes (Signed)
Summary: GI?  Phone Note Call from Patient   Caller: Nickolas Madrid [phone] Summary of Call: Fraser Din called stating that pt is not feeling any better since last OV and he think he needs to go to the Hospital. I called pt's friend back at number provided and left message on machine for pt to return my call  Initial call taken by: Margaret Pyle, CMA,  June 17, 2010 11:06 AM  Follow-up for Phone Call        [place] returned my called stating that pt was mentally and emotionally unable to collect urine or stool sample for testing. Fraser Din, as pt's caretaker states that pt would not even allow him to collect samples. After traige discussed situation with MD, Fraser Din advised to hold on sample collection for now and will refer to previous GI MD or to [facility]. Fraser Din stated that pt would prefer referral to [facility] for eval ASAP Follow-up by: Margaret Pyle, CMA,  June 17, 2010 11:47 AM  Additional Follow-up for Phone Call Additional follow up Details #1::        got it, agree as noted above - order to [facility] done - thanks - Newt Lukes MD  June 17, 2010 11:56 AM

## 2010-12-23 NOTE — Progress Notes (Signed)
Summary: triage  Phone Note Call from Patient Call back at Home Phone [phone] Call back at [phone]   Caller: Caregiver - Lesia Hausen Call For: Dr Christella Hartigan Reason for Call: Talk to Nurse Details for Reason: Triage Summary of Call: Pt. can't hold anything down. Mr. Corliss Blacker wants pt. seen today. Very emphatic. Please call back asap. Initial call taken by: Schuyler Amor,  June 27, 2010 8:45 AM  Follow-up for Phone Call        left message on machine to call back Chales Abrahams CMA (AAMA)  June 27, 2010 9:07 AM   pt caregiver is calling the pt has n,v and diarrhea x 1 month.  unable to keep anything down.  Pt caregiver would like him seen today he will be out of town and unable to bring him in later.  Appt scheduled with Amy Follow-up by: Chales Abrahams CMA (AAMA),  June 27, 2010 11:41 AM

## 2010-12-23 NOTE — Procedures (Signed)
Summary: Upper Endoscopy  Patient: [name] Note: All result statuses are Final unless otherwise noted.  Tests: (1) Upper Endoscopy (EGD)   EGD Upper Endoscopy       DONE      [facility]     [address]     [place], [place]  [address]           ENDOSCOPY PROCEDURE REPORT           PATIENT:  Eric Waters, Eric Waters  MR#:  [id number]     BIRTHDATE:  02/24/1951, 58 yrs. old  GENDER:  male           ENDOSCOPIST:  Barbette Hair. Arlyce Dice, MD     Referred by:  Rene Paci, M.D.           PROCEDURE DATE:  07/02/2010     PROCEDURE:  EGD with biopsy     ASA CLASS:  Class II     INDICATIONS:  Diarrhea; celiac serologies suggestive of celiac     disease           MEDICATIONS:   Fentanyl 50 mcg IV, Versed 4 mg IV, glycopyrrolate     (Robinal) 0.2 mg IV, 0.6cc simethancone 0.6 cc PO     TOPICAL ANESTHETIC:  Exactacain Spray           DESCRIPTION OF PROCEDURE:   After the risks benefits and     alternatives of the procedure were thoroughly explained, informed     consent was obtained.  The LB GIF-H180 [id number] endoscope was     introduced through the mouth and advanced to the third portion of     the duodenum, without limitations.  The instrument was slowly     withdrawn as the mucosa was fully examined.     <<PROCEDUREIMAGES>>           The upper, middle, and distal third of the esophagus were     carefully inspected and no abnormalities were noted. The z-line     was well seen at the GEJ. The endoscope was pushed into the fundus     which was normal including a retroflexed view. The antrum,gastric     body, first and second part of the duodenum were unremarkable.     Multiple biopsies were obtained and sent to pathology (see image1     and image2). Multiple biopsies of duodenal folds were taken to r/o     celiac disease    Retroflexed views revealed no abnormalities.     The scope was then withdrawn from the patient and the procedure     completed.           COMPLICATIONS:   None           ENDOSCOPIC IMPRESSION:     1) Normal EGD     RECOMMENDATIONS:     1) Await biopsy results     2) Call office next 2-3 days to schedule an office appointment     for 3 weeks           REPEAT EXAM:  No           ______________________________     Barbette Hair. Arlyce Dice, MD           CC:           n.     eSIGNED:   Barbette Hair. Shellene Sweigert at 07/02/2010 03:37 PM           [name],  [id number]  Note: An exclamation mark (!) indicates a result that was not dispersed into the flowsheet. Document Creation Date: 07/02/2010 3:38 PM _______________________________________________________________________  (1) Order result status: Final Collection or observation date-time: 07/02/2010 15:31 Requested date-time:  Receipt date-time:  Reported date-time:  Referring Physician:   Ordering Physician: Melvia Heaps [phone]) Specimen Source:  Source: Launa Grill Order Number: [id number] Lab site:

## 2010-12-23 NOTE — Progress Notes (Signed)
Summary: Diarrhea  Phone Note From Other Clinic   Caller: Gavin Pound [phone] @ Dr Felicity Coyer Call For: Dr Christella Hartigan ( Doc of the day) Reason for Call: Schedule Patient Appt Summary of Call: Diarrhea would like pt seen asap. PA appt would be allright- this will be a new patient. Initial call taken by: Leanor Kail [facility],  June 17, 2010 1:08 PM  Follow-up for Phone Call        Gavin Pound  ntfd. that pt. given appt. with PA. for 06/23/10. She will contact pt. Follow-up by: Teryl Lucy RN,  June 18, 2010 9:03 AM

## 2010-12-23 NOTE — Assessment & Plan Note (Signed)
Summary: allergic rhinitisis/apc   Vital Signs:  Patient profile:   60 year old male Height:      72 inches Weight:      177.7 pounds BMI:     24.19 O2 Sat:      95 % on Room air Pulse rate:   71 / minute BP sitting:   124 / 72  (right arm) Cuff size:   regular  Vitals Entered By: Reynaldo Minium CMA (July 17, 2010 3:10 PM)  O2 Flow:  Room air CC: allergy consult Comments meds and allergies updated Reynaldo Minium CMA  July 17, 2010 3:16 PM    Visit Type:  Initial Consult Copy to:  Rene Paci, MD Primary Provider/Referring Provider:  Rene Paci, MD  CC:  allergy consult.  History of Present Illness: July 17, 2010- 57 yoM in company of cousin/ caregiver Tori Milks, seen on kind referral by Dr Felicity Coyer for allergy evaluation. 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  Never smoker, complainsof perennial sneezing, hackingup phlegm, postnasal drip. Ears may itch. Onset over 30 years ago. Worse in Spring and Fall. IgE was 332. He has been taking 5 Claritin daily on long term basis, and he asks if postnasal drip may be causing diarrhea. I pointed out that high dose antihistamines may cause GI upset. Denies hx asthma or hives/ atopic dermatitis. Denies sensitivity to insect stings, latex, contrast or aspirin. Sister and children have seasonal allergies.  Preventive Screening-Counseling & Management  Alcohol-Tobacco     Smoking Status: never  Current Medications (verified): 1)  Imodium A-D 2 Mg Tabs (Loperamide Hcl) .... Take 1-2 By Mouth Every 4-6 Hours As Needed For Diarrhea, Max 8 Tab/24h 2)  Claritin 10 Mg Tabs (Loratadine) .Marland Kitchen.. 1 By Mouth Two Times A Day 3)  Echinacea 80 Mg Caps (Echinacea) .... Take 1 Capsule By Mouth Once A Day 4)  Bentyl 10 Mg Caps (Dicyclomine Hcl) .... Take 1 Tab 3 Times Daily As Needed For Cramping  Allergies: 1)  ! Codeine  Past History:  Past  Surgical History: Last updated: 06/27/2010 Tonsillectomy (1959) COLONOSCOPY 12/10 DR. HUNG NORMAL.  Family History: Last updated: 06/27/2010 Maternal uncle alzheimer Maternal grandfather alzheimer Maternal grandmother hx huntington chorea Maternal uncle hx stroke Family History Lung cancer (father) No FH of Colon Cancer:  Social History: Last updated: 07/17/2010 Never Smoked no alcohol use single, separated from wife since 2003 lives with friend of family moved to [facility] area from [place] spring 2010 to be closer to [facility] and caregivers Illicit Drug Use - no Patient does not get regular exercise.   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 Boris Sharper christine research coordinator [phone]  Social History: Never Smoked no alcohol use single, separated from wife since 2003 lives with friend of family moved to [place] area from [place] spring 2010 to be closer to [facility] and caregivers Illicit Drug Use - no Patient does not get regular exercise.   Review of Systems      See HPI       The patient complains of abdominal pain, sneezing, and itching.  The patient denies shortness of breath with activity, shortness of breath at rest, productive cough, non-productive cough, coughing up blood, chest pain, irregular heartbeats,  acid heartburn, indigestion, loss of appetite, weight change, difficulty swallowing, sore throat, tooth/dental problems, headaches, ear ache, anxiety, depression, hand/feet swelling, joint stiffness or pain, rash, change in color of mucus, and fever.    Physical Exam  Additional Exam:  General: A/Ox3; pleasant and cooperative, NAD, SKIN: no rash, lesions NODES: no lymphadenopathy HEENT: [place]/AT, EOM- WNL, Conjuctivae- clear, PERRLA, TM-WNL, Nose- clear,  Throat- clear and wnl NECK: Supple w/ fair ROM, JVD- none, normal carotid impulses w/o bruits Thyroid- normal to palpation CHEST: Clear to P&A HEART: RRR, no m/g/r heard ABDOMEN: Soft and nl; nml bowel sounds; no organomegaly or masses noted UJW:JXBJ, nl pulses, no edema  NEURO: Grossly writhing choreaform movement continuously.      Impression & Recommendations:  Problem # 1:  ALLERGIC RHINITIS (ICD-477.9)  I'm not convinced that this is necessarily allergic. He has been on supratheraputic doses of claritin. We will replace these with Patanase,. Allegra and Singulair.If not better will discuss skin testing. As discussed, lowering his oral antihistamne dose might give opportunity for some GI complaints to improve. There will be room for more discussion of environmental dust controls, air cleaners and etc. His updated medication list for this problem includes:    Claritin 10 Mg Tabs (Loratadine) .Marland Kitchen... 1 by mouth two times a day  Other Orders: Consultation Level IV (47829)  Patient Instructions: 1)  Please schedule a follow-up appointment in 3 weeks. 2)  Stop Claritin 3)  Try otc Allegra/ fexofenadine 180/ 24 hour: 1 each morning 4)  Try sample Patanase: 2 puffs each nostril every night at bedtime 5)  Try sample Singulair 1 each evening 6)  CC Dr Felicity Coyer

## 2010-12-23 NOTE — Progress Notes (Signed)
Summary: Referral  Phone Note Call from Patient Call back at Home Phone [phone]   Caller: Shellee Milo Summary of Call: Pt's caretaker called requesting referral for pt to Podiatrist for foot care (nail clippings) Initial call taken by: Margaret Pyle, CMA,  September 15, 2010 4:18 PM  Follow-up for Phone Call        ok - referral done Follow-up by: Newt Lukes MD,  September 15, 2010 4:49 PM  Additional Follow-up for Phone Call Additional follow up Details #1::        Caretaker advised in previous conversation to expect call from [facility] with appt info Additional Follow-up by: Margaret Pyle, CMA,  September 16, 2010 8:05 AM  New Problems: INGROWING NAIL (ICD-703.0)   New Problems: INGROWING NAIL (ICD-703.0)

## 2011-03-16 ENCOUNTER — Ambulatory Visit (INDEPENDENT_AMBULATORY_CARE_PROVIDER_SITE_OTHER): Payer: Medicare PPO | Admitting: Internal Medicine

## 2011-03-16 ENCOUNTER — Encounter: Payer: Self-pay | Admitting: Internal Medicine

## 2011-03-16 DIAGNOSIS — K1120 Sialoadenitis, unspecified: Secondary | ICD-10-CM

## 2011-03-16 DIAGNOSIS — G10 Huntington's disease: Secondary | ICD-10-CM

## 2011-03-16 MED ORDER — AZITHROMYCIN 250 MG PO TABS: ORAL_TABLET | ORAL | Status: AC

## 2011-03-16 NOTE — Assessment & Plan Note (Signed)
[facility] - requests local physician -  Will refer in fall once new [facility] doc established for same - Caregiver aware and understands same - will seek other care if crisis arises before that time Reports neuro symptoms stable

## 2011-03-16 NOTE — Progress Notes (Signed)
  Subjective:    Patient ID: Eric Waters, male    DOB: December 21, 1950, 60 y.o.   MRN: [id number]  HPI  here with "caregiver" friend today complains of recurrent "saliva gland infections" under his neck - last event treated with zpack, prev with PCN -  infections occurs 1-2x/year - Last episode onset 3 weeks ago - seen at allergist - given doxy and pred + "shot" for the inflammation worried symptoms coming back sooner than before because did not have "enough zpack pills" no fever, no trouble swallowing, no sore throat - +dry mouth   huntington's dz - [facility] - under research for same (dr. Thelma Barge walker) -  Previously had visits every 4-12 weeks but stopped visits and medications summer 2011 feels symptoms initially improved on tx , then worse again - Requests new local neurologist "in [place] please"  seasonal allg - uses zyrtec as needed for same -  symptoms not bad now - ongoing eval and tx by allergist Corinda Gubler)   Past Medical History  Diagnosis Date  . Allergy   . GERD (gastroesophageal reflux disease)   . Huntington chorea     Follows at [facility] Dr Boris Sharper [phone] [name])  . Chronic diarrhea     Review of Systems  Constitutional: Positive for fatigue. Negative for unexpected weight change.  HENT: Negative for ear pain, congestion and ear discharge.   Eyes: Positive for itching.  Respiratory: Negative for choking.   Cardiovascular: Negative for leg swelling.  Neurological: Negative for headaches.       Objective:   Physical Exam  Constitutional: He is oriented to person, place, and time. He appears well-developed and well-nourished.       Caregiver at side  HENT:  Head: Normocephalic and atraumatic.  Right Ear: External ear normal.  Left Ear: External ear normal.  Mouth/Throat: No oropharyngeal exudate.  Eyes: Conjunctivae are normal. Pupils are equal, round, and reactive to light.  Neck: Normal range of motion. Neck  supple.  Cardiovascular: Normal rate, regular rhythm and normal heart sounds.   Pulmonary/Chest: Effort normal and breath sounds normal. No respiratory distress.  Lymphadenopathy:    He has cervical adenopathy (R>L side, min enlarged, min tender).  Neurological: He is alert and oriented to person, place, and time.       Continuous chorea motion, esp RUE and head/neck  Skin: Skin is warm and dry. No rash noted. No erythema.   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

## 2011-03-16 NOTE — Assessment & Plan Note (Signed)
  hx recurrent dz -  recent tx with doxy + pred but not improved -- but concerned it is starting back and had good relief with Zpak - no indication of active infection but provided with z-pack to fill and use as needed -

## 2011-03-16 NOTE — Patient Instructions (Signed)
It was good to see you today. Zpak for infection - Your prescription(s) have been submitted to your pharmacy. Please take as directed and contact our office if you believe you are having problem(s) with the medication(s). We have reviewed your prior records including medications today Ask Dr. Corinda Gubler to send [place] copies of his visit notes we'll make referral to [facility] neurology in late summer/fall 2012 . Our office will contact you regarding appointment(s) once made - call sooner if problems

## 2012-05-06 ENCOUNTER — Encounter (HOSPITAL_COMMUNITY): Payer: Self-pay | Admitting: Emergency Medicine

## 2012-05-06 ENCOUNTER — Emergency Department (HOSPITAL_COMMUNITY)
Admission: EM | Admit: 2012-05-06 | Discharge: 2012-05-07 | Disposition: A | Discharge status: Other Acute Inpatient Hospital | Payer: Medicare PPO | Source: Home / Self Care | Attending: Emergency Medicine | Admitting: Emergency Medicine

## 2012-05-06 DIAGNOSIS — G10 Huntington's disease: Secondary | ICD-10-CM | POA: Insufficient documentation

## 2012-05-06 DIAGNOSIS — F329 Major depressive disorder, single episode, unspecified: Secondary | ICD-10-CM

## 2012-05-06 DIAGNOSIS — R45851 Suicidal ideations: Secondary | ICD-10-CM | POA: Insufficient documentation

## 2012-05-06 DIAGNOSIS — X58XXXA Exposure to other specified factors, initial encounter: Secondary | ICD-10-CM | POA: Insufficient documentation

## 2012-05-06 DIAGNOSIS — IMO0002 Reserved for concepts with insufficient information to code with codable children: Secondary | ICD-10-CM | POA: Insufficient documentation

## 2012-05-06 DIAGNOSIS — Z79899 Other long term (current) drug therapy: Secondary | ICD-10-CM | POA: Insufficient documentation

## 2012-05-06 DIAGNOSIS — K219 Gastro-esophageal reflux disease without esophagitis: Secondary | ICD-10-CM | POA: Insufficient documentation

## 2012-05-06 DIAGNOSIS — T07XXXA Unspecified multiple injuries, initial encounter: Secondary | ICD-10-CM

## 2012-05-06 LAB — COMPREHENSIVE METABOLIC PANEL
ALT: 32 U/L (ref 0–53)
AST: 68 U/L — ABNORMAL HIGH (ref 0–37)
Alkaline Phosphatase: 81 U/L (ref 39–117)
CO2: 23 mEq/L (ref 19–32)
Chloride: 102 mEq/L (ref 96–112)
Creatinine, Ser: 1.31 mg/dL (ref 0.50–1.35)
GFR calc non Af Amer: 58 mL/min — ABNORMAL LOW (ref >90)
Potassium: 3.9 mEq/L (ref 3.5–5.1)
Sodium: 138 mEq/L (ref 135–145)
Total Bilirubin: 0.3 mg/dL (ref 0.3–1.2)

## 2012-05-06 LAB — CBC
HCT: 39.1 % (ref 39.0–52.0)
Hemoglobin: 13.1 g/dL (ref 13.0–17.0)
RDW: 13 % (ref 11.5–15.5)
WBC: 5.7 10*3/uL (ref 4.0–10.5)

## 2012-05-06 LAB — DIFFERENTIAL
Basophils Absolute: 0 10*3/uL (ref 0.0–0.1)
Lymphocytes Relative: 20 % (ref 12–46)
Monocytes Absolute: 0.7 10*3/uL (ref 0.1–1.0)
Neutro Abs: 3.8 10*3/uL (ref 1.7–7.7)
Neutrophils Relative %: 66 % (ref 43–77)

## 2012-05-06 MED ORDER — LORAZEPAM 1 MG PO TABS: 1.0000 mg | ORAL_TABLET | Freq: Three times a day (TID) | ORAL | Status: DC | PRN

## 2012-05-06 MED ORDER — BACITRACIN ZINC 500 UNIT/GM EX OINT
TOPICAL_OINTMENT | CUTANEOUS | Status: AC
  Administered 2012-05-06: 1 via TOPICAL
  Filled 2012-05-06: qty 0.9

## 2012-05-06 MED ORDER — TETANUS-DIPHTH-ACELL PERTUSSIS 5-2.5-18.5 LF-MCG/0.5 IM SUSP
0.5000 mL | Freq: Once | INTRAMUSCULAR | Status: AC
  Administered 2012-05-06: 0.5 mL via INTRAMUSCULAR
  Filled 2012-05-06: qty 0.5

## 2012-05-06 NOTE — ED Notes (Signed)
Pt's POA Tori Milks  [phone]

## 2012-05-06 NOTE — ED Notes (Signed)
Pt states he was unable to do something he wanted to do so he cut his wrists. Pt states "I wasn't really trying to kill myself." Pt with hx Huntington's.

## 2012-05-06 NOTE — ED Notes (Signed)
Pt's family in room with him.

## 2012-05-06 NOTE — ED Notes (Signed)
Pt status changed to psych hold per Dr. Bernette Mayers. Pt placed in paper scrubs and wanded by security. Protocols explained to pt.

## 2012-05-06 NOTE — BH Assessment (Addendum)
Assessment Note   Eric Waters is an 61 y.o. male. Pt has huntington's disease, unable to stop moving entire assessment.  When asked why he was here, Pt reported "I cut my arm."  States that a decision is being made that I don't like, specifically, an elder in the church won't allow him to change to a different congregation.  States he cut his arm to show his displeasure.  Denies suicidal intent.  ACT team also spoke to Eric Waters, husband of pt's cousin and POA.  He reports that pt is stating he wants to get married, however, pt is currently married but has been separated 20 years without getting a divorce.  Pt was told by several people that due to his religious beliefs, he can only get a divorce if there are grounds, which there apparantly are not.  Pt in the past few days brought this up again and began telling Eric Waters that he "has taken everything away" and threatened suicide.  After talking and going to church on Tuesday, pt appeared OK.  Eric Waters went by tonight to check on pt and found dried blood around the bathroom and pt had cut wrist-at least one day ago.  Pt again told him that he had taken everything away from him.  Eric Waters reports pt's functioning has deteriorated both physically and mentally over past year.  Pt's hygiene has been deteriorating over the past few weeks.  No HI, No AV.  No substance abuse issues reported.  Axis I: Depressive Disorder NOS Axis II: Deferred Axis III:  Past Medical History  Diagnosis Date  . Allergy   . GERD (gastroesophageal reflux disease)   . Huntington chorea     previously followed at [facility]- Dr Boris Sharper [phone] [name])  . Chronic diarrhea    Axis IV: problems with primary support group Axis V: 41-50 serious symptoms  Past Medical History:  Past Medical History  Diagnosis Date  . Allergy   . GERD (gastroesophageal reflux disease)   . Huntington chorea     previously followed at [facility]- Dr Boris Sharper [phone] [name])  .  Chronic diarrhea     Past Surgical History  Procedure Date  . Tonsillectomy 1959    Family History:  Family History  Problem Relation Age of Onset  . Cancer Father     Lung  . Alzheimer's disease Maternal Uncle   . Stroke Maternal Uncle     Alzheimer  . Alzheimer's disease Maternal Grandfather   . Huntington's disease Maternal Grandmother   . Huntington's disease Mother     Social History:  reports that he has never smoked. He has never used smokeless tobacco. He reports that he does not drink alcohol or use illicit drugs.  Additional Social History:  Alcohol / Drug Use Pain Medications: pt denies Prescriptions: pt denies Over the Counter: pt denies History of alcohol / drug use?: No history of alcohol / drug abuse  CIWA: CIWA-Ar BP: 164/77 mmHg Pulse Rate: 89  COWS:    Allergies:  Allergies  Allergen Reactions  . Codeine Hives and Rash    Home Medications:  (Not in a hospital admission)  OB/GYN Status:  No LMP for male patient.  General Assessment Data Location of Assessment: [facility] ACT Assessment: Yes Living Arrangements: Non-relatives/Friends Can pt return to current living arrangement?: Yes Admission Status: Other (Comment) (Relative has POA) Referral Source: Self/Family/Friend  Education Status Is patient currently in school?: No  Risk to self Suicidal Ideation: Yes-Currently Present  Suicidal Intent: No Is patient at risk for suicide?: Yes Suicidal Plan?: Yes-Currently Present Specify Current Suicidal Plan: cut wrist Access to Means: Yes Specify Access to Suicidal Means: not sure what blade was used What has been your use of drugs/alcohol within the last 12 months?: denies use Previous Attempts/Gestures: No Intentional Self Injurious Behavior: None Family Suicide History: No Recent stressful life event(s): Conflict (Comment) (with holder of POA) Persecutory voices/beliefs?: No Depression: No Substance abuse history and/or treatment for  substance abuse?: No Suicide prevention information given to non-admitted patients: Not applicable  Risk to Others Homicidal Ideation: No Thoughts of Harm to Others: No Current Homicidal Intent: No Current Homicidal Plan: No Access to Homicidal Means: No History of harm to others?: No Assessment of Violence: None Noted Does patient have access to weapons?: No Criminal Charges Pending?: No Does patient have a court date: No  Psychosis Hallucinations: None noted Delusions: None noted  Mental Status Report Appear/Hygiene: Other (Comment) (casual) Eye Contact: Good Motor Activity: Agitation Speech: Logical/coherent Level of Consciousness: Alert Mood: Other (Comment) (cooperative) Affect: Appropriate to circumstance Anxiety Level: Minimal Thought Processes: Coherent;Relevant Judgement: Unimpaired Orientation: Person;Place;Time;Situation Obsessive Compulsive Thoughts/Behaviors: None  Cognitive Functioning Concentration: Normal Memory: Recent Intact;Remote Intact IQ: Average Insight: Good Impulse Control: Fair Appetite: Good Weight Loss: 0  Weight Gain: 0  Sleep: No Change Total Hours of Sleep: 8  Vegetative Symptoms: Decreased grooming  ADLScreening [facility] Assessment Services) Patient's cognitive ability adequate to safely complete daily activities?: Yes Patient able to express need for assistance with ADLs?: Yes Independently performs ADLs?: Yes  Abuse/Neglect [facility]) Physical Abuse: Denies Verbal Abuse: Denies Sexual Abuse: Denies  Prior Inpatient Therapy Prior Inpatient Therapy: No  Prior Outpatient Therapy Prior Outpatient Therapy: Yes Prior Therapy Dates: unknown Prior Therapy Facilty/Provider(s): counseling in past, per pt Reason for Treatment: depression  ADL Screening (condition at time of admission) Patient's cognitive ability adequate to safely complete daily activities?: Yes Patient able to express need for assistance with ADLs?: Yes Independently  performs ADLs?: Yes Communication: Independent Dressing (OT): Independent Grooming: Independent Feeding: Independent Bathing: Independent Toileting: Independent In/Out Bed: Independent Walks in Home: Independent       Abuse/Neglect Assessment (Assessment to be complete while patient is alone) Physical Abuse: Denies Verbal Abuse: Denies Sexual Abuse: Denies Exploitation of patient/patient's resources: Denies Self-Neglect: Yes, present (Comment) (decreased hygiene recently) Values / Beliefs Cultural Requests During Hospitalization: No blood transfusion (add FYI) Spiritual Requests During Hospitalization: None   Advance Directives (For Healthcare) Advance Directive: Patient has advance directive, copy not in chart Type of Advance Directive: Healthcare Power of [place];Living will;Mental Health Advance Directive Advance Directive not in Chart: Copy requested from family    Additional Information 1:1 In Past 12 Months?: No CIRT Risk: No Elopement Risk: No Does patient have medical clearance?: Yes     Disposition: Discussed pt with Dr Bernette Mayers, who requests pt be admitted to inpt pysch care.  Pt POA Eric Waters also consulted for info in assessment.  05/07/12, 1400, pt accepted to [facility], [place] Spurr to Dr Dan Humphreys, [phone].  All paperwork completed.  [facility] requested pt be committed with IVC and this was also completed.  Dr Clarene Duke, current EDP at [facility] notified. Disposition Disposition of Patient: Inpatient treatment program Type of inpatient treatment program: Adult  On Site Evaluation by:   Reviewed with Physician:     Eric Waters 05/06/2012 10:15 PM

## 2012-05-06 NOTE — ED Provider Notes (Signed)
History     CSN: [id number]  Arrival date & time 05/06/12  2024   First MD Initiated Contact with Patient 05/06/12 2034      Chief Complaint  Patient presents with  . Suicide Attempt    (Consider location/radiation/quality/duration/timing/severity/associated sxs/prior treatment) HPI Pt with history of Huntington's chorea reports a couple of days ago he was frustrated because he wanted to do something but was told he couldn't. He will not elaborate further about that situation but apparently he tried to cut his wrists bilaterally out of frustration and attention seeking. He denies any suicidal ideation then or now. It is unclear who called EMS today, but he reports it was Fraser Din'. He states he is independent of ADLs and has a roommate named '[name]' that helps him around the house. He denies any somatic complaints, no pain or fevers. States he has had baseline mood and neurologic functioning recently.   Past Medical History  Diagnosis Date  . Allergy   . GERD (gastroesophageal reflux disease)   . Huntington chorea     previously followed at [facility]- Dr Boris Sharper [phone] [name])  . Chronic diarrhea     Past Surgical History  Procedure Date  . Tonsillectomy 1959    Family History  Problem Relation Age of Onset  . Cancer Father     Lung  . Alzheimer's disease Maternal Uncle   . Stroke Maternal Uncle     Alzheimer  . Alzheimer's disease Maternal Grandfather   . Huntington's disease Maternal Grandmother   . Huntington's disease Mother     History  Substance Use Topics  . Smoking status: Never Smoker   . Smokeless tobacco: Never Used   Comment: Single, separated from wife 2003. Lives with friend of family. Moved to [name] area from [place] spring 2010 to be closer to [name] and caregiver  . Alcohol Use: No      Review of Systems All other systems reviewed and are negative except as noted in HPI.   Allergies  Codeine  Home Medications   Current Outpatient Rx  Name  Route Sig Dispense Refill  . CETIRIZINE HCL 10 MG PO TABS Oral Take 10 mg by mouth 2 (two) times daily.      Marland Kitchen DICYCLOMINE HCL 10 MG PO CAPS Oral Take 10 mg by mouth 3 (three) times daily as needed. As needed for abdominal cramping     . ECHINACEA 80 MG PO CAPS Oral Take 1 capsule by mouth daily.      Marland Kitchen FLUOXETINE HCL 20 MG PO TABS Oral Take 20 mg by mouth daily.      Marland Kitchen LOPERAMIDE HCL 2 MG PO TABS Oral Take by mouth. 1-2 tablets by mouth every 4-6 hours as needed for diarrhea, max 8 tablets/24 hours     . MOMETASONE FUROATE 50 MCG/ACT NA SUSP Nasal 2 sprays by Nasal route daily.      . OLOPATADINE HCL 0.2 % OP SOLN Ophthalmic Apply to eye as needed.      Marland Kitchen PREDNISONE 10 MG PO TABS Oral Take 10 mg by mouth daily.      Marland Kitchen ZAFIRLUKAST 20 MG PO TABS Oral Take 20 mg by mouth 2 (two) times daily. One hour before or two hours after meals       BP 164/77  Pulse 89  Temp 99.1 F (37.3 C) (Oral)  Resp 16  Ht 6' (1.829 m)  Wt 185 lb (83.915 kg)  BMI 25.09 kg/m2  SpO2 94%  Physical Exam  Nursing note and vitals reviewed. Constitutional: He is oriented to person, place, and time. He appears well-developed and well-nourished.  HENT:  Head: Normocephalic and atraumatic.  Eyes: EOM are normal. Pupils are equal, round, and reactive to light.  Neck: Normal range of motion. Neck supple.  Cardiovascular: Normal rate, normal heart sounds and intact distal pulses.   Pulmonary/Chest: Effort normal and breath sounds normal.  Abdominal: Bowel sounds are normal. He exhibits no distension. There is no tenderness.  Musculoskeletal: Normal range of motion. He exhibits no edema and no tenderness.  Neurological: He is alert and oriented to person, place, and time. He has normal strength. No cranial nerve deficit or sensory deficit.       Near constant choreic movements of arms and neck  Skin: Skin is warm and dry. No rash noted.       Areas of superficial abrasion on both palmar wrists with dried blood, no  lacerations or signs of secondary infection  Psychiatric: He has a normal mood and affect.    ED Course  Procedures (including critical care time)  Labs Reviewed - No data to display No results found.   No diagnosis found.    MDM  Discussed with patient that if he does not have any suicidal ideation he does not need to be admitted for psychiatric care. He is adamant that he was not trying to kill himself. I advised that he try to contact his roommate to ensure that he will be safe at home. He was initially agreeable to this plan, but then stated he would like to 'spend the night here'. I advised him that he could not stay the night here, but if he did not feel safe at home or if he felt that he would be a danger to himself, we could work towards a psychiatric admission. He again stated that he did not feel that he would be a danger to himself or others and at this time is attempting to contact [name]'.   Pt's family has arrived. His cousin's husband is his POA. He relates that the patient has had very labile mood the last few days. Was not caring for himself, not shaving or bathing earlier in the week. Threatened suicide at that time but had improved mood the following day. His mood was again depressed yesterday and this is when he attempted to cut himself. His living situation is not conducive to improving his mental state. He is renting a room with another man and does not have much support other than his cousin and her husband. Pt is estranged from his wife and kids and this seems to contribute to his depression. I feel that he would be best served by placement in a psychiatric facility for stabilization. Family is in agreement. ACT has seen the patient and will begin that process.      Persephone Schriever B. Bernette Mayers, MD 05/07/12 0000

## 2012-05-07 ENCOUNTER — Inpatient Hospital Stay (HOSPITAL_COMMUNITY)
Admission: AD | Admit: 2012-05-07 | Discharge: 2012-05-23 | DRG: 885 | Disposition: A | Discharge status: Home / Self Care | Payer: Medicare PPO | Source: Ambulatory Visit | Attending: Psychiatry | Admitting: Psychiatry

## 2012-05-07 DIAGNOSIS — R112 Nausea with vomiting, unspecified: Secondary | ICD-10-CM

## 2012-05-07 DIAGNOSIS — F339 Major depressive disorder, recurrent, unspecified: Secondary | ICD-10-CM | POA: Diagnosis present

## 2012-05-07 DIAGNOSIS — Y998 Other external cause status: Secondary | ICD-10-CM

## 2012-05-07 DIAGNOSIS — Z79899 Other long term (current) drug therapy: Secondary | ICD-10-CM

## 2012-05-07 DIAGNOSIS — Y9210 Unspecified residential institution as the place of occurrence of the external cause: Secondary | ICD-10-CM | POA: Diagnosis not present

## 2012-05-07 DIAGNOSIS — K219 Gastro-esophageal reflux disease without esophagitis: Secondary | ICD-10-CM | POA: Diagnosis present

## 2012-05-07 DIAGNOSIS — Y92009 Unspecified place in unspecified non-institutional (private) residence as the place of occurrence of the external cause: Secondary | ICD-10-CM

## 2012-05-07 DIAGNOSIS — X789XXA Intentional self-harm by unspecified sharp object, initial encounter: Secondary | ICD-10-CM | POA: Diagnosis present

## 2012-05-07 DIAGNOSIS — R197 Diarrhea, unspecified: Secondary | ICD-10-CM

## 2012-05-07 DIAGNOSIS — J309 Allergic rhinitis, unspecified: Secondary | ICD-10-CM | POA: Diagnosis present

## 2012-05-07 DIAGNOSIS — W19XXXA Unspecified fall, initial encounter: Secondary | ICD-10-CM

## 2012-05-07 DIAGNOSIS — L600 Ingrowing nail: Secondary | ICD-10-CM

## 2012-05-07 DIAGNOSIS — F22 Delusional disorders: Secondary | ICD-10-CM | POA: Diagnosis present

## 2012-05-07 DIAGNOSIS — S0081XA Abrasion of other part of head, initial encounter: Secondary | ICD-10-CM

## 2012-05-07 DIAGNOSIS — G10 Huntington's disease: Secondary | ICD-10-CM | POA: Diagnosis present

## 2012-05-07 DIAGNOSIS — K1120 Sialoadenitis, unspecified: Secondary | ICD-10-CM | POA: Diagnosis present

## 2012-05-07 DIAGNOSIS — F411 Generalized anxiety disorder: Secondary | ICD-10-CM | POA: Diagnosis present

## 2012-05-07 DIAGNOSIS — F332 Major depressive disorder, recurrent severe without psychotic features: Principal | ICD-10-CM | POA: Diagnosis present

## 2012-05-07 DIAGNOSIS — S61509A Unspecified open wound of unspecified wrist, initial encounter: Secondary | ICD-10-CM | POA: Diagnosis present

## 2012-05-07 LAB — RAPID URINE DRUG SCREEN, HOSP PERFORMED
Amphetamines: NOT DETECTED
Barbiturates: NOT DETECTED
Tetrahydrocannabinol: NOT DETECTED

## 2012-05-07 MED ORDER — ALUM & MAG HYDROXIDE-SIMETH 200-200-20 MG/5ML PO SUSP: 30.0000 mL | ORAL | Status: DC | PRN

## 2012-05-07 MED ORDER — ACETAMINOPHEN 325 MG PO TABS
650.0000 mg | ORAL_TABLET | Freq: Four times a day (QID) | ORAL | Status: DC | PRN
Start: 2012-05-07 — End: 2012-05-23

## 2012-05-07 MED ORDER — LOPERAMIDE HCL 2 MG PO CAPS
8.0000 mg | ORAL_CAPSULE | ORAL | Status: DC
  Administered 2012-05-09 – 2012-05-19 (×11): 8 mg via ORAL
  Filled 2012-05-07 (×15): qty 4

## 2012-05-07 MED ORDER — MAGNESIUM HYDROXIDE 400 MG/5ML PO SUSP: 30.0000 mL | Freq: Every day | ORAL | Status: DC | PRN

## 2012-05-07 MED ORDER — TRAZODONE HCL 50 MG PO TABS
50.0000 mg | ORAL_TABLET | Freq: Every evening | ORAL | Status: DC | PRN
  Administered 2012-05-07 – 2012-05-09 (×2): 50 mg via ORAL
  Filled 2012-05-07 (×2): qty 1

## 2012-05-07 MED ORDER — FLUTICASONE PROPIONATE 50 MCG/ACT NA SUSP: 2.0000 | Freq: Every day | NASAL | Status: DC

## 2012-05-07 MED ORDER — BACITRACIN ZINC 500 UNIT/GM EX OINT
TOPICAL_OINTMENT | CUTANEOUS | Status: AC
  Administered 2012-05-07: 08:00:00
  Filled 2012-05-07: qty 1.8

## 2012-05-07 MED ORDER — ACETAMINOPHEN 325 MG PO TABS
650.0000 mg | ORAL_TABLET | Freq: Four times a day (QID) | ORAL | Status: DC | PRN
  Administered 2012-05-09: 650 mg via ORAL

## 2012-05-07 MED ORDER — FLUTICASONE PROPIONATE 50 MCG/ACT NA SUSP
2.0000 | Freq: Every day | NASAL | Status: DC
  Administered 2012-05-08 – 2012-05-23 (×16): 2 via NASAL
  Filled 2012-05-07: qty 16

## 2012-05-07 MED ORDER — BACITRACIN 500 UNIT/GM EX OINT
1.0000 "application " | TOPICAL_OINTMENT | Freq: Once | CUTANEOUS | Status: AC
  Administered 2012-05-06: 1 via TOPICAL

## 2012-05-07 MED ORDER — LORATADINE 10 MG PO TABS
10.0000 mg | ORAL_TABLET | Freq: Every day | ORAL | Status: DC
  Administered 2012-05-08 – 2012-05-23 (×16): 10 mg via ORAL
  Filled 2012-05-07 (×20): qty 1

## 2012-05-07 MED ORDER — FLUOXETINE HCL 20 MG PO CAPS
40.0000 mg | ORAL_CAPSULE | Freq: Every day | ORAL | Status: DC
  Administered 2012-05-08 – 2012-05-23 (×16): 40 mg via ORAL
  Filled 2012-05-07: qty 2
  Filled 2012-05-07: qty 1
  Filled 2012-05-07 (×17): qty 2
  Filled 2012-05-07: qty 28

## 2012-05-07 MED ORDER — BACITRACIN ZINC 500 UNIT/GM EX OINT
TOPICAL_OINTMENT | CUTANEOUS | Status: AC
  Filled 2012-05-07: qty 1.8

## 2012-05-07 MED ORDER — AZELASTINE HCL 0.1 % NA SOLN
2.0000 | Freq: Two times a day (BID) | NASAL | Status: DC
  Administered 2012-05-08 – 2012-05-09 (×2): 2 via NASAL
  Filled 2012-05-07: qty 30

## 2012-05-07 MED ORDER — BACITRACIN-NEOMYCIN-POLYMYXIN 400-5-5000 EX OINT: TOPICAL_OINTMENT | Freq: Once | CUTANEOUS | Status: DC

## 2012-05-07 NOTE — ED Notes (Signed)
[facility] called at this time to transport pt to [facility]. Eric Waters

## 2012-05-07 NOTE — ED Notes (Signed)
Pt up ambulated to bathroom with no difficulty

## 2012-05-07 NOTE — Progress Notes (Signed)
Patient ID: Eric Waters, male   DOB: Nov 12, 1951, 61 y.o.   MRN: [id number] 05-07-12 @ 1934 nursing adm note: this adm is incomplete. Pt came to [facility] invol[facility]arily with an admitting dx of depressive d/o nos. He attempted suicide by cutting his wrist. They are wrapped and the pt denied that any sutures were necessary. He was able to contract for suicidality and denied any hi/av. He had no complaints of pain, is allergic to codeine and is a non smoker. He denied any etoh and illegal drug use. He stated he has huntington's disease and has seasonal allergies. He is a high fall risk.  Fall risk precautions were taken. Pt refused to use a cane or wheelchair when the rn suggested it. Pt was escorted to the 500 hall. Pa was called and stated she would put in the orders remotely.   Report was given to joanne g, rn

## 2012-05-07 NOTE — ED Notes (Signed)
Greg with [facility] here. Involuntary papers per [facility] needed. Bed assignment obtained.

## 2012-05-07 NOTE — ED Notes (Signed)
Pt has superficial lacerations to the inside of both wrist, no bleeding controlled, area red, both wrists cleaned with NS, bacitracin applied, telfa, 4X4's, gauze applied, pt tolerated well.

## 2012-05-07 NOTE — ED Notes (Signed)
Received report, pt cooperative, states that he only cut his wrist with a knife and razor in an attempt to get attention from his family because he wanted to change church's, pt denies any SI or HI, per Dr. Hyacinth Meeker pt does not need sitter, only to have medications removed from room, pt was wanded by security, pt has t-shit and gym shorts on, pt given breakfast, cooperative to stay for treatment.

## 2012-05-07 NOTE — ED Provider Notes (Addendum)
  Physical Exam  BP 116/59  Pulse 69  Temp 99.5 F (37.5 C) (Oral)  Resp 20  Ht 6' (1.829 m)  Wt 185 lb (83.915 kg)  BMI 25.09 kg/m2  SpO2 98%  Physical Exam  ED Course  Procedures  MDM I have discussed care of the patient with a psychiatrist as well as with the patient and he has agreed to be discharged home, has no suicidal thoughts and no ongoing discontent. The psychiatrist is also stated in his telephone psychiatry interview recommendations the following "during interview patient had multiple choreoathetotic movements but answered questions appropriately, not agitated aggressive or delusional. Patient is currently psychiatric stable and does not need psych hospitalization. The patient is psych clear for discharge and can be followed up as an outpatient."  The pt states he stays with a roommate that can come pick him up.  He agrees to not harm himself anymore.  Vida Roller, MD 05/07/12 [phone]  After discussion with brother-in-law Mr. Wylie Hail, it has been evidence that the patient has had increased negative symptoms over the last several weeks but not showering complicating or taking care of himself at all. He states that he has been more reclusive, not wanting to talk to family members and has had outbursts of threatening behavior. He states he does want to talk to anybody, forces people to leave his house and then was found this evening with cut marks on bilateral wrists. The patient does not want to talk about this, I have redressed this with Tammy Sours from the act team who will continue with placement attempt this morning.  After hearing what the family members state I find it somewhat difficult to believe that the patient is stating regarding his declaration of a no self-harm. I believe that he would be at risk if he went home and this should be admitted to the hospital for observation and face-to-face psychiatric evaluation and treatment.  Vida Roller, MD 05/07/12 [phone]

## 2012-05-07 NOTE — Discharge Instructions (Signed)
RESOURCE GUIDE  Chronic Pain Problems: Contact [facility]  [phone] Patients need to be referred by their primary care doctor.  Insufficient Money for Medicine: Contact United Way:  call "211" or [facility] 271-5999.  No Primary Care Doctor: - Call Health Connect  [phone] - can help you locate a primary care doctor that  accepts your insurance, provides certain services, etc. - Physician Referral Service- 1-800-533-3463  Agencies that provide inexpensive medical care: - [facility]  [phone] - [facility]  [phone] - [facility]  [phone] - [facility]  [phone] - Planned Parenthood  [phone] - [facility]  [phone]  Medicaid-accepting [place] Providers: - [facility]- [address]  [phone], Mon-Fri 9am-7pm, Sat 9am-1pm - [facility]- [address], [address]  [phone] - [facility]- [address], [address]  [phone] - [facility]- [address] [place] [address]  [phone] - Veita Bland- [address], [address], [phone]  Only accepts [place] Access Medicaid patients after they have their name  applied to their card  Self Pay (no insurance) in [place]: - Sickle Cell Patients: Dr Eric Dean, [facility]  [address], [phone] - [facility]- [address]  [phone]       -     [facility]- [address]       -     [facility]- see information above (Speak to Pam H if you do not have insurance)       -  [facility]- [address], [phone]       -  [facility] [place]- [address],  [phone]       -  [facility]- [address] [place] [address], [phone]       -  Dr Osei-Bonsu-  [address], [place], [phone]       -  [facility]- [address], [phone]       -  [facility]- [address] [place] [address], [phone], also [address], [phone]       -    [facility]- [address], [phone], 1st & 3rd Saturday   every month, 10am-1pm  1) Find a Doctor and Pay Out of Pocket Although you won't have to find out who is covered by your insurance plan, it is a good idea to ask around and get recommendations. You will then need to call the office and see if the doctor you have chosen will accept you as a new patient and what types of options they offer for patients who are self-pay. Some doctors offer discounts or will set up payment plans for their patients who do not have insurance, but you will need to ask so you aren't surprised when you get to your appointment.  2) Contact Your Local Health Department Not all health departments have doctors that can see patients for sick visits, but many do, so it is worth a call to see if yours does. If you don't know where your local health department is, you can check in your phone book. The CDC also has a tool to help you locate your state's health department, and many state websites also have   listings of all of their local health departments.  3) Find a Walk-in Clinic If your illness is not likely to be very severe or complicated, you may want to try a walk in clinic. These are popping up all over the country in pharmacies, drugstores, and shopping centers. They're usually staffed by nurse practitioners or physician assistants that have been trained to treat common illnesses and complaints. They're usually fairly quick and inexpensive. However, if you have serious medical issues or chronic medical problems, these are probably not your best option  STD Testing - [place] [facility] [place], [facility]D Clinic, [address], [place], phone [phone] or 1-877-539-9860.  Monday - Friday, call for an appointment. - [place]  [facility] [place], [facility]D Clinic, [address], [place], phone [phone] or 1-877-539-9860.  Monday - Friday, call for an appointment.  Abuse/Neglect: - [place] Child Abuse Hotline (336) 641-3795 - [place] Child Abuse Hotline 800-378-5315 (After Hours)  Emergency Shelter:  [facility] (336) 271-5985  Maternity Homes: - [facility] (336) 275-9566 - [facility] (704) 372-4663  MRSA Hotline #:   832-7006  [place] Resources  [facility] [place]  [employer] [place] [facility] [address]                 [address]         [address]  [place]                                               [place]                              [place] Phone:  [phone]                                  Phone:  [phone]                   Phone:  [phone]  [place] [facility], [phone] - [place] Services - [facility]- 1-888-581-9988       -     [facility] in [place], [address],                                  336-349-4454, Insurance  [place] Child Abuse Hotline (336) 342-1394 or (336) 342-3537 (After Hours)   Behavioral Health Services  Substance Abuse Resources: - [facility]  336-882-2125 - [facility] 336-784-9470 - [facility] 336-285-9073 - [facility] 336-845-3988 - Residential & Outpatient Substance Abuse Program  800-659-3381  Psychological Services: - [facility]  832-9600 - [facility]  [phone] - [place] [facility], [address], [place], ACCESS LINE: 1-800-853-5163 or 336-641-4981, Http://www.[facility].com/services/adult.htm  Dental Assistance  If unable to pay or uninsured, contact:  [facility] or [place] [facility] to become qualified for the adult dental  clinic.  Patients with Medicaid: [facility] [address], [phone], [phone]  If unable   to pay, or uninsured, contact [facility] (271-5999) or [place] [facility] ([phone] in [place], [phone] in [place]) to become qualified for the adult dental clinic  Other Low-Cost Community Dental Services: - [facility]- [address], [place], [place], [address], [phone], Ext. [address], 2nd and 4th Thursday of the month at 6:30am.  10 clients each day by appointment, can sometimes see walk-in patients if someone does not show for an appointment. - [facility]- [address], [place], [place], [address], [phone] - [facility]- [address], [place], [place], [address], [phone] - [place] [facility]- [phone] - [facility]- [phone] - [facility]- [phone]    

## 2012-05-07 NOTE — ED Provider Notes (Signed)
Pt held overnight in ED, waiting re-eval by ACT team for placement.  Pt's family very concerned regarding pt's increasing negative symptoms (ie: not taking care of himself, not showering) for the past 3 weeks and "cuts" on his bilat wrists.  Pt apparently lives with a roommate, but it was family that visited him at home that found him in this state and brought him to the ED for eval.  Pt currently calm and cooperative eating breakfast.     Laray Anger, DO 05/07/12 7829

## 2012-05-07 NOTE — ED Notes (Signed)
Patients belongings removed and secured in locker. Patient placed in paper scrubs and non-slip socks.

## 2012-05-07 NOTE — ED Notes (Signed)
Charge nurse- Juliette Alcide spoke with Tammy Sours at [facility]. Dr. At facility will reassess pt paperwork. Per ACT team

## 2012-05-07 NOTE — ED Provider Notes (Signed)
Pt accepted to [facility], IVC paperwork completed.  Pt transferred stable.   Laray Anger, DO 05/07/12 1523

## 2012-05-07 NOTE — ED Notes (Signed)
Patient to be discharge, brother called to come pick him up. Patient belongings returned. IV's d/c'd.

## 2012-05-08 DIAGNOSIS — F322 Major depressive disorder, single episode, severe without psychotic features: Secondary | ICD-10-CM

## 2012-05-08 MED ORDER — AZELASTINE HCL 0.1 % NA SOLN
2.0000 | Freq: Once | NASAL | Status: DC
  Filled 2012-05-08: qty 30

## 2012-05-08 NOTE — Progress Notes (Signed)
[facility] Family/Significant Other Suicide Prevention Education  Suicide Prevention Education:  Education Completed;   Eric [phone]-Pt.'s roommate and Eric Waters-daughter-[phone]- and Eric Waters--pt.'s son in law has been identified by the patient as the family member/significant other with whom the patient will be residing, and identified as the person(s) who will aid the patient in the event of a mental health crisis (suicidal ideations/suicide attempt).  With written consent from the patient, the family member/significant other has been provided the following suicide prevention education, prior to the and/or following the discharge of the patient.  The suicide prevention education provided includes the following:  Suicide risk factors  Suicide prevention and interventions  National Suicide Hotline telephone number  [facility] assessment telephone number  [facility] Emergency Assistance 911  [facility] and/or Residential Mobile Crisis Unit telephone number  Request made of family/significant other to:  Remove weapons (e.g., guns, rifles, knives), all items previously/currently identified as safety concern. Pt.'s roommate states  there are no guns in the home. Pt.'s roommate and pt. Daughter/family will secure the pt. Home before he is d/c.   Remove drugs/medications (over-the-counter, prescriptions, illicit drugs), all items previously/currently identified as a safety concern.Pt. Roommate had no safety concerns and know of any other SI attempts if this nature. Pt.'s roommate is unsure of what pt. used  to cut his wrist. Pt.'s roommate suggested that pt.'s POA, Fraser Din McNeil-[phone] be called but was informed by counselor that pt. Had not signed consent to speak with POA. Pt.'s roommate states the pt. May be mad with preston at this moment, which let to the pt.'s SI attempt. Pt. Family(daughter) sowed up at 2:45 pm. Suddenly, and they  were spoke to give SI education and to address concerns or question by the pt.'s daughter and husband. Pt.'s daughter should be contacted immediately on 05/09/16 at [place]-[phone]. Pt. Gave consent on 05/09/12 to talk to her.  The family member/significant other verbalizes understanding of the suicide prevention education information provided.  The family member/significant other agrees to remove the items of safety concern listed above.  Lamar Blinks [place] 05/08/2012, 4:49 PM
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
## 2012-05-08 NOTE — Progress Notes (Signed)
[facility] Adult Inpatient Family/Significant Other Suicide Prevention Education  Suicide Prevention Education:  Contact Attempts: [name] Nye-[phone]-Pt.'s friend- has been identified by the patient as the family member/significant other with whom the patient will be residing, and identified as the person(s) who will aid the patient in the event of a mental health crisis.  With written consent from the patient, two attempts were made to provide suicide prevention education, prior to and/or following the patient's discharge.  We were unsuccessful in providing suicide prevention education.  A suicide education pamphlet was given to the patient to share with family/significant other.  Date and time of first attempt: By Lamar Blinks on 05/08/12 at 10:59 am-Number disconnected/unable to leave message Date and time of second attempt:  Neila Gear 05/08/2012, 10:58 AM

## 2012-05-08 NOTE — Progress Notes (Signed)
Adult Services Patient-Family Contact/Session  Attendees:  Lamar Blinks, Therapist,  Solmon Ice [phone], pt.'s daughter and pt.'s son in law-Lawrence Weems.  Goal(s):  SI  Education and to gather collateral information to aid in the pt.'s treatment.  Safety Concerns: Pt. Lives with roommate [name] [phone] and has a POA, [name] McNeil-[phone] and  Pt. Recently had conflict over church issues and cut both his wrist to as the pt. Stated " prove to his POA  That he was serious about moving/attending another church" pt. Stated he wanted to prove this by cutting his wrist to let Fraser Din know he was serious. Pt.'s roommate states there are no guns in the home, but he is unsure of how pt. Cut his wrist with. Pt.'s daughter and roommate will secure the home before th ept. Is d/c. Pt. Wants to return to living situation, but family sates the pt. Is not realistic about attending another church and is concerned about conflict that caused this SI attempt.  Narrative:  Pt.'s family showed up unexpected for visit and therapist met with them briefly. Pt.'s family talked about the pt.'s diagnosis of Huntington's Disease and their fear that something like this may happen again. Family stated that the conflict that the pt. Referred to that caused the pt. To cut his wrist  Was not that big of a issue that the pt. Is making it out to be, but that the pt. May feel that the pt.'s caretaker and POA is against him. Pt.'s daughter gave background history and stated that before the pt.'s POA Fraser Din took over, The pt. Was very resistant and determined to do things his way. Pt.'s daughter states she and her sibling were not able to handle the pt.and hen Fraser Din took over [place] and care of the pt. That he got a lot accomplished. Pt.'s family would like to mend the relationship between the pt. And Fraser Din, his POA. Pt.'s daughter can be reached and should be called on Monday by the Case Manager. During the intake, the pt  was asked what happened that caused him to cut his wrist and the pt. Replied' to prove to his POA([name]) that he was seroius about wanting to attend another church. Pt. discussed conflict and stated he cut his wrist to let "preston know he was serious".  Barrier(s):  Pt. Daughter, who this session was completed with lives in [place], and will be returning home so that the pt.'s son in law can return to work  Interventions:  Counsellor, therapist met with pt.'s family and pt.'s daughter would like to meet with pt.'s POA in a meeting at  [facility] to try to resolve the conflict. Pt.'s family feel pt. May feel the pt. Thinks his POA is against him.  Recommendation(s):  Follow up appointments talk to pt. Who has safety concerns abut this happening again, about possible other placement/living arrangements for the pt.  Follow-up Required:  Yes  Explanation:  Call pt. Daughter above ASAP. Case Manger, Chip Boer [place] 05/08/2012, 5:00 PM

## 2012-05-08 NOTE — Progress Notes (Signed)
[facility] Group Notes:  (Counselor/Nursing/MHT/Case Management/Adjunct)  05/08/2012 11:57 AM  Type of Therapy:  After care Planning group  Pt. participated in after care planning group and pt. Was given [facility] Suicide Prevention pamphlet along with crisis hotline numbers. Pt. agreed to use them if needed. Pt. Was given information on free support groups and information about the [facility] located in [place], [place]. Pt. Got to [facility] yesterday and is here for SI attempt. Pt. Denies current SI /Hi today. Pt. Reports low energy level today.   Lamar Blinks [place] 05/08/2012, 11:57 AM

## 2012-05-08 NOTE — Tx Team (Signed)
Initial Interdisciplinary Treatment Plan  PATIENT STRENGTHS: (choose at least two) Average or above average intelligence Communication skills Religious Affiliation Supportive family/friends  PATIENT STRESSORS: Health problems   PROBLEM LIST: Problem List/Patient Goals Date to be addressed Date deferred Reason deferred Estimated date of resolution  Depression with suicidal gesture 05/07/12                                                      DISCHARGE CRITERIA:  Ability to meet basic life and health needs Improved stabilization in mood, thinking, and/or behavior Safe-care adequate arrangements made  PRELIMINARY DISCHARGE PLAN: Return to previous living arrangement  PATIENT/FAMIILY INVOLVEMENT: This treatment plan has been presented to and reviewed with the patient, Eric Waters, Eric Waters 05/08/2012, 3:20 AM

## 2012-05-08 NOTE — H&P (Signed)
  Pt was seen by me today. Will continue current meds.  Will consider adding risperidone today as mentioned in my SI assessment note. The detailed H&P note will be done by mid level (NP/PA).

## 2012-05-08 NOTE — Progress Notes (Signed)
Patient ID: Eric Waters, male   DOB: 11-Dec-1950, 61 y.o.   MRN: [id number]   [facility] Group Notes:  (Counselor/Nursing/MHT/Case Management/Adjunct)  05/08/2012 11 AM  Type of Therapy:  Aftercare Planning, Group Therapy, Dance/Movement Therapy   Participation Level:  Active  Participation Quality:  Appropriate  Affect:  Appropriate  Cognitive:  Appropriate  Insight:  Good  Engagement in Group:  Good  Engagement in Therapy:  Good  Modes of Intervention:  Clarification, Problem-solving, Role-play, Socialization and Support  Summary of Progress/Problems: After Care: Pt. attended and participated in aftercare planning group. Pt. verbally accepted information on suicide prevention, warning signs to look for with suicide and crisis line numbers to use. Pt. did not have any questions or concerns regarding discharge during aftercare planning group.  Counseling:  Today in group, the counselor discussed supports. Patients were invited to share their definition of support and also how support can be harmful. Group ended by patients sharing one positive support they currently have in their lives. Pt. shared that he has several friends who can be supports that he has not used in the past but plans to use them in the future for support.   Cassidi Long

## 2012-05-08 NOTE — Progress Notes (Signed)
D  Received pt from [address]  It was determined he would benefit programming and residing on the [address]   Pt has huntingtons chorea and moves constantly with jerky movements  During changing his dressings he was appropriate and logical  in his thinking   he was attentive in group and participated well   He was bright pleasant and joking around with staff   A Verbal support given  Medications administered and effectiveness monitored  Dressing change done both wrists   Areas were reddened open draining clear fluid with small amounts of blood oozing after gauze removed  No signs of infection seen at this time  Areas are superficial but open and the top layer of skin is gone over atleast 50 % of the area  Reported findings to pa and applied antibiotic ointment  Nonadherent dressing and kerlex    Q 15 min checks R  Pt safe at present
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
## 2012-05-08 NOTE — Progress Notes (Signed)
Patient ID: Eric Waters, male   DOB: 07/25/1951, 61 y.o.   MRN: [id number] The patient is very labile but pleasant. His body movements are spastic due to having Huntington's Chorea. His speech and thoughts are very tangential. Does not seem to be aware of where he is at or why he is here. Can't explain the self inflicted cuts to both of his wrist. Denied any suicidal ideation at this time. Offered no c/o of pain. Compliant with medication. Will continue to monitor.

## 2012-05-08 NOTE — [facility] Suicide Risk Assessment (Signed)
Suicide Risk Assessment  Admission Assessment     Demographic factors:  Assessment Details Time of Assessment: Admission Information Obtained From: Patient Current Mental Status:  Current Mental Status: Suicidal ideation indicated by patient, disorganized, tangential thought process, mood sad, affect blunted, poor insight, denies hi or AVH now, possible delusions Loss Factors:  Loss Factors: Decline in physical health Historical Factors:   denies si attempts in past Risk Reduction Factors:  Risk Reduction Factors: Positive social support  CLINICAL FACTORS:   Currently Psychotic  COGNITIVE FEATURES THAT CONTRIBUTE TO RISK:  Loss of executive function    SUICIDE RISK:   Moderate:  Frequent suicidal ideation with limited intensity, and duration, some specificity in terms of plans, no associated intent, good self-control, limited dysphoria/symptomatology, some risk factors present, and identifiable protective factors, including available and accessible social support.  PLAN OF CARE: Axis I: Depressive Disorder NOS, cognitive d/o Nos, r/o psychotic d/o nos,   Axis II: Deferred  Axis III:  Past Medical History   Diagnosis  Date   .  Allergy    .  GERD (gastroesophageal reflux disease)    .  Huntington chorea      previously followed at [facility]- Dr Boris Sharper [phone] [name])   .  Chronic diarrhea     Axis IV: problems with primary support group  Axis V: 30  Plan;  Continue prozac. Will increase the dose later Add risperidone for suspected delusions and mood Labs including tsh Primary team to consider imaging    Wonda Cerise 05/08/2012, 10:49 AM

## 2012-05-09 DIAGNOSIS — F339 Major depressive disorder, recurrent, unspecified: Secondary | ICD-10-CM | POA: Diagnosis present

## 2012-05-09 LAB — VITAMIN B12: Vitamin B-12: 1485 pg/mL — ABNORMAL HIGH (ref 211–911)

## 2012-05-09 LAB — TSH: TSH: 0.717 u[IU]/mL (ref 0.350–4.500)

## 2012-05-09 LAB — LIPID PANEL: LDL Cholesterol: 95 mg/dL (ref 0–99)

## 2012-05-09 MED ORDER — CLONAZEPAM 0.5 MG PO TABS
0.5000 mg | ORAL_TABLET | ORAL | Status: DC
  Administered 2012-05-09 – 2012-05-12 (×6): 0.5 mg via ORAL
  Filled 2012-05-09 (×6): qty 1

## 2012-05-09 MED ORDER — TRAZODONE HCL 50 MG PO TABS
75.0000 mg | ORAL_TABLET | Freq: Every day | ORAL | Status: DC
  Administered 2012-05-09 – 2012-05-22 (×14): 75 mg via ORAL
  Filled 2012-05-09 (×17): qty 1

## 2012-05-09 MED ORDER — AZELASTINE HCL 0.1 % NA SOLN
2.0000 | NASAL | Status: DC
  Administered 2012-05-09 – 2012-05-23 (×28): 2 via NASAL
  Filled 2012-05-09: qty 30

## 2012-05-09 MED ORDER — CEPHALEXIN 500 MG PO CAPS
500.0000 mg | ORAL_CAPSULE | ORAL | Status: AC
  Administered 2012-05-09 – 2012-05-18 (×20): 500 mg via ORAL
  Filled 2012-05-09 (×22): qty 1

## 2012-05-09 NOTE — Progress Notes (Signed)
Patient ID: Eric Waters, male   DOB: Jun 22, 1951, 61 y.o.   MRN: [id number] He has been up for AM meal and for medications then went back to bed. He denies thoughts of SI. He did not voice any c/o  Of discomfort.Marland Kitchen

## 2012-05-09 NOTE — Progress Notes (Signed)
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  The patient was seen today and reports the following:   ADL's: Intact.  Sleep: The patient reports to having significant difficulty initiating and maintaining sleep.   Appetite: The patient reports a good appetite today.   Mild>(1-10) >Severe  Hopelessness (1-10): 0  Depression (1-10): 0  Anxiety (1-10): 0   Suicidal Ideation: The patient adamantly denies any suicidal ideations today.  Plan: No  Intent: No  Means: No   Homicidal Ideation: The patient adamantly denies any homicidal ideations today.  Plan: No  Intent: No.  Means: No   General Appearance/Behavior: The patient was cooperative today with this provider.  Eye Contact: Good.  Speech: Appropriate in rate and volume with no pressuring noted today.  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  Level of Consciousness: Alert and Oriented x 3.  Mental Status: Alert and Oriented x 3.  Mood: Appears essentially euthymic today.  Affect: Appears bright and full today.  Anxiety Level: No anxiety reported today.  Thought Process:  wnl.  Thought Content: The patient denies any auditory or visual hallucinations today as well as any delusions thinking.  Perception: wnl.  Judgment: Fair.  Insight: Fair.  Cognition: Oriented to person, place and time.  Sleep:  Number of Hours: 3    Vital Signs:Blood pressure 100/63, pulse 58, temperature 97.7 F (36.5 C), temperature source Oral, resp. rate 16, height 6' (1.829 m), weight 83.915 kg (185 lb), SpO2 100.00%.  Current Medications: Current Facility-Administered Medications  Medication Dose Route Frequency Provider Last Rate Last Dose  . acetaminophen (TYLENOL) tablet 650 mg  650 mg Oral Q6H PRN Verne Spurr, PA-C      . alum & mag hydroxide-simeth (MAALOX/MYLANTA) 200-200-20 MG/5ML suspension 30 mL  30 mL Oral Q4H PRN Verne Spurr,  PA-C      . azelastine (ASTELIN) nasal spray 2 spray  2 spray Each Nare BH-qamhs Harvie Morua D Hannan Tetzlaff, MD      . clonazePAM (KLONOPIN) tablet 0.5 mg  0.5 mg Oral BH-qamhs Adeleigh Barletta D Lataja Newland, MD      . FLUoxetine (PROZAC) capsule 40 mg  40 mg Oral Daily Curlene Labrum Jillane Po, MD   40 mg at 05/09/12 0844  . fluticasone (FLONASE) 50 MCG/ACT nasal spray 2 spray  2 spray Each Nare Daily Ronny Bacon, MD   2 spray at 05/09/12 0843  . loperamide (IMODIUM) capsule 8 mg  8 mg Oral BH-q7a Curlene Labrum Aysen Shieh, MD   8 mg at 05/09/12 0844  . loratadine (CLARITIN) tablet 10 mg  10 mg Oral Daily Curlene Labrum Tekeyah Santiago, MD   10 mg at 05/09/12 0844  . magnesium hydroxide (MILK OF MAGNESIA) suspension 30 mL  30 mL Oral Daily PRN Verne Spurr, PA-C      . traZODone (DESYREL) tablet 50 mg  50 mg Oral QHS PRN Verne Spurr, PA-C   50 mg at 05/09/12 0214  . DISCONTD: acetaminophen (TYLENOL) tablet 650 mg  650 mg Oral Q6H PRN Verne Spurr, PA-C   650 mg at 05/09/12 0214  . DISCONTD: alum & mag hydroxide-simeth (MAALOX/MYLANTA) 200-200-20 MG/5ML suspension 30 mL  30 mL Oral Q4H PRN Verne Spurr, PA-C      . DISCONTD: azelastine (ASTELIN) nasal spray 2 spray  2 spray Each Nare BID Mike Craze, MD   2 spray at 05/09/12 0843  . DISCONTD: azelastine (ASTELIN) nasal spray 2 spray  2  spray Each Nare Once Mike Craze, MD      . DISCONTD: magnesium hydroxide (MILK OF MAGNESIA) suspension 30 mL  30 mL Oral Daily PRN Verne Spurr, PA-C       Lab Results:  Results for orders placed during the hospital encounter of 05/07/12 (from the past 48 hour(s))  TSH     Status: Normal   Collection Time   05/09/12  6:30 AM      Component Value Range Comment   TSH 0.717  0.350 - 4.500 uIU/mL   T4, FREE     Status: Normal   Collection Time   05/09/12  6:30 AM      Component Value Range Comment   Free T4 0.96  0.80 - 1.80 ng/dL   LIPID PANEL     Status: Normal   Collection Time   05/09/12  6:30 AM      Component Value Range Comment    Cholesterol 177  0 - 200 mg/dL    Triglycerides 56  <161 mg/dL    HDL 71  >09 mg/dL    Total CHOL/HDL Ratio 2.5      VLDL 11  0 - 40 mg/dL    LDL Cholesterol 95  0 - 99 mg/dL   VITAMIN U04     Status: Abnormal   Collection Time   05/09/12  6:30 AM      Component Value Range Comment   Vitamin B-12 1485 (*) 211 - 911 pg/mL   RPR     Status: Normal   Collection Time   05/09/12  6:30 AM      Component Value Range Comment   RPR NON REACTIVE  NON REACTIVE    Review of Systems:  Neurological: The patient denies any headaches today.  He denies any seizures or dizziness.  G.I.: The patient denies any constipation or G.I. Upset today.  Musculoskeletal: The patient denies any muscle or skeletal difficulties with the exception of his significant involuntary movements.   Time was spent today discussing with the patient his current symptoms. The patient reports to having significant difficulty initiating and maintaining sleep.  He reports a good appetite and denies any significant feelings of sadness, anhedonia or depressed mood.  He denies any anxiety symptoms today and denies any auditory or visual hallucinations or delusional thinking.   The patient states that he was hospitalized after cutting both wrists in an apparent suicidal gesture.  He states he cut his wrists after being told my his minister that he could not change churches.  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  Treatment Plan Summary:  1. Daily contact with patient to assess and evaluate symptoms and progress in treatment.  2. Medication management  3. The patient will deny suicidal ideations or homicidal ideations for 48 hours prior to discharge and have a depression and anxiety rating of 3 or less. The patient will also deny any auditory or visual hallucinations or delusional thinking.  4. The patient will deny any symptoms of substance withdrawal at time  of discharge.   Plan:  1. Will continue the medication Prozac at 40 mgs po q am for depression.  2. Will start the medication Klonopin at 0.5 mgs po q am and hs which may help reduce his involuntary movements related to his Huntington's Disease. 3. Will start Trazodone at 75 mgs po qhs for sleep. 4. Will start Keflex 500 mgs po  q am and hs x 10 days for cellulitis of wrists. 5. Laboratory studies reviewed.  6. Will continue to monitor.    Dawnelle Warman 05/09/2012, 3:07 PM

## 2012-05-09 NOTE — Progress Notes (Signed)
Pt in the dayroom watching TV.  He is easily engaged in conversation.  He reports he has had a good day.  He asks about getting his daughter's ph # from his cell phone.  Inform pt that we do not go into pt's lockers unless it is necessary and only if it pertains to their treatment.  He then said his phone was in his room.  Walked with pt to his room so he could look for his phone.  This Clinical research associate was going to get the phone to put in his locker if he indeed had it.  He could not find it.  Told pt that it was probably in his locker, and he could speak to his caseworker in the morning about getting it.  Otherwise, pt was appropriate/polite.  He voiced no other concerns/needs.  He denies SI/HI at this time.  Safety maintained with q15 minute checks.

## 2012-05-09 NOTE — Progress Notes (Signed)
Pt's daughter and son in law were allowed to visit pt this afternoon. Pt's daughter took his wallet and wrist watch with her when she left. Writer made a copy of the pt medical POA for the shadow chart which was in the pt wallet. Pt gave consent for her to take his belongings with her. Pt is in good spirits this afternoon. Writer will continue to monitor.

## 2012-05-09 NOTE — Progress Notes (Signed)
Patient ID: Eric Waters, male   DOB: 06-01-1951, 61 y.o.   MRN: [id number] Dressing changed on right and left wrists. Aeries has serious drainage and area looks a little inflamed. Dr.  Informed.

## 2012-05-09 NOTE — Discharge Planning (Signed)
Patient's daughter and son-in-law arrived to visit with Case Manager and patient before heading back to [place].  Along with them was patient's Power of [employer].  They reported that patient had deteriorated over a period of just days, and that his anger/agitation had been based on a misinterpretation or delusion that the POA was trying to keep him from remarrying.  When POA initially explained that he was only advising him to seek remarriage in a legal way, the patient was happy, but several days later had lapsed into a similar mood apparently, and cut his wrists.    The family reported a similar deterioration in 2007.  They stated that patient becomes very fixated when he does not get what he wants.  The family worked with the RN to look through patient's possessions from the locker, and ended up keeping his wallet and watch.  There is a Health Care Power of Attorney in patient's wallet, but apparently it only addresses the receipt of blood products and is not applicable to this particular situation.  The POA does state that administration of any medication and draws of blood for labs is allowable for the patient's particular faith.  It is only the giving of blood products which is not okay.  POA and "Declaration of a Desire for a Natural Death" have both been copied and placed in shadow chart.  Utilization review was done for additional days.  [name], LCSW 05/09/2012, 4:14 PM

## 2012-05-09 NOTE — Tx Team (Signed)
Interdisciplinary Treatment Plan Update (Adult)  Date:  05/09/2012  Time Reviewed:  10:15AM-11:15AM  Progress in Treatment: Attending groups:  Yes Participating in groups:    Yes Taking medication as prescribed:    Yes Tolerating medication:   Yes Family/Significant other contact made:  Yes Patient understands diagnosis:   Yes Discussing patient identified problems/goals with staff:   Yes Medical problems stabilized or resolved:   Yes Denies suicidal/homicidal ideation:  Yes, "Not today" Issues/concerns per patient self-inventory:   None Other:    New problem(s) identified: Yes, Describe:  patient's family wants a meeting with him and the POA, and he adamantly refuses to see the POA. We may well need to meet with the POA or talk to them by phone to arrange placement and/or follow-up  Reason for Continuation of Hospitalization: Medication stabilization Suicidal ideation Other; describe family issues to be addressed, so that it is ensured that patient gets appropriate follow-up  Interventions implemented related to continuation of hospitalization:  Medication monitoring and adjustment, safety checks Q15 min., suicide risk assessment, group therapy, psychoeducation, collateral contact, aftercare planning, ongoing physician assessments, medication education  Additional comments:  Not applicable  Estimated length of stay:  2-3 days  Discharge Plan:  Return to live with roommate, follow up to be determined, as patient states he does not have a psychiatrist  New goal(s):  Not applicable  Review of initial/current patient goals per problem list:   1.  Goal(s):  Medication stabilization  Met:  No  Target date:  By Discharge   As evidenced by:  Ongoing  2.  Goal(s):  Decrease depression to no greater than 3 at discharge.  Met:  No  Target date:  By Discharge   As evidenced by:  "0" today, needs longer  3.  Goal(s):  Work on plans to reduce isolation at discharge.  Met:   No  Target date:  By Discharge   As evidenced by:  Just vocalized this goal this morning, needs to work on it.  4.  Goal(s):  Deny SI for 48 hours prior to D/C.  Met:  No  Target date:  By Discharge   As evidenced by:  "Not today" is patient's answer, needs longer  Attendees: Patient:  Eric Waters  05/09/2012 10:15AM-11:15AM  Family:     Physician:  Dr. Harvie Heck Readling 05/09/2012 10:15AM-11:15AM  Nursing:   Roswell Miners, RN 05/09/2012 10:15AM -11:15AM   Case Manager:  [name], LCSW 05/09/2012 10:15AM-11:15AM  Counselor:  Marni Griffon, LCAS 05/09/2012 10:15AM-11:15AM  Other:   Neill Loft, RN 05/09/2012 10:15AM-11:15AM  Other:   Abbie Sons, RN 05/09/2012   Other:      Other:       Scribe for Treatment Team:   Sarina Ser, 05/09/2012, 10:15AM-11:15AM

## 2012-05-09 NOTE — Progress Notes (Signed)
[facility] Group Notes:  (Counselor/Nursing/MHT/Case Management/Adjunct)  05/09/2012 12:15 PM  Type of Therapy: Group Therapy   Participation Level: Minimal   Participation Quality: Minimal  Affect: Labile  Cognitive: oriented, alert   Insight: minimal  Engagement in Group: Limited  Modes of Intervention: Clarification, Education, Problem-solving, Socialization, Activity, Encouragement and Support   Summary of Progress/Problems: Pt participated in group by listening attentively and self disclosing.  Therapist prompted clients to identify successful methods they had used to manage anger, depression, insomnia and voices. Pt explained that blowing up things resulted in an elevated mood.  Pt stated he had blown up buildings.  Therapist confronted Pt who admitted he was joking, but did say he had worked with a Scientist, research (medical) up [employer].  Therapist informed Pt that the things he says are taking seriously and encouraged him to refrain from telling something that is not true.  Therapy was focused on developing positive coping skills and increasing self esteem. Therapist encouraged Pt to express feelings.  Therapist explained the affirmations exercise and Pt gave positive affirmations to another Pt.  Pt was open to feedback from others.  Therapist offered support and encouragement.  Slight progress noted.  Intervention effective.         Christen Butter 05/09/2012, 12:15 PM

## 2012-05-09 NOTE — Progress Notes (Signed)
Patient ID: Eric Waters, male   DOB: 04/25/1951, 61 y.o.   MRN: [id number] Has been out and about on hall this evening, pacing the halls at times, states just doesn't feel like sitting down to watch the game for long, has spastic,  uncoordinated movements which make it difficult to be still.  Has been pleasant and cooperative, requested for his dsgs to be changed, and they were.  Wrists are raw and seepy, neosporin and telfa dsgs applied to help keep from sticking to the wounds. Was appreciative and pleasant, refuses trazadone at this time, doesn't feel it helps much with sleep.  Stated he would walk in the halls awhile longer until he felt tired and then would lie down and try to sleep.  Attended group this evening and watched some of the basketball game, felt he has had a good day.  Affect was fairly bright.  Will continue to try to develop therapeutic rapport, will continue to monitor.

## 2012-05-09 NOTE — Progress Notes (Signed)
Patient ID: Eric Waters, male   DOB: Oct 26, 1951, 61 y.o.   MRN: [id number] He said that he was not depressed nor hopeless. C/O not sleeping and wants medication to help him sleep.

## 2012-05-09 NOTE — Progress Notes (Signed)
Patient ID: Eric Waters, male   DOB: [birth year], 61 y.o.   MRN: [id number] He stated that he would fill out the self inventory sheet later today.

## 2012-05-09 NOTE — Discharge Planning (Signed)
Met with patient in Aftercare Planning Group.   He stated his preferred name is "Eric Waters."  Patient lives with a friend [name], and said his friend will pick him up, transport home at discharge.  He does not have a psychiatrist, states he has not had a need for such a doctor.  Patient states he wants to work on his tendency to isolate himself when he gets home, and acknowledged that this does not feel good.  It was discussed at length with him that we would like to speak to his Power of [place], but he continued to adamantly refuse such.  Case Manager left a message on his daughter's voicemail explaining this.  [name], LCSW 05/09/2012, 3:08 PM

## 2012-05-10 ENCOUNTER — Encounter (HOSPITAL_COMMUNITY): Payer: Self-pay | Admitting: *Deleted

## 2012-05-10 MED ORDER — HALOPERIDOL 0.5 MG PO TABS
0.5000 mg | ORAL_TABLET | Freq: Every day | ORAL | Status: DC
  Administered 2012-05-10: 0.5 mg via ORAL
  Filled 2012-05-10 (×4): qty 1

## 2012-05-10 NOTE — Progress Notes (Signed)
[facility] Group Notes:  (Counselor/Nursing/MHT/Case Management/Adjunct)  05/10/2012 12:15 PM  Type of Therapy: Group Therapy   Participation Level: Minimal   Participation Quality: Minimal  Affect: Blunted   Cognitive: oriented, alert   Insight: minimal  Engagement in Group: Limited  Modes of Intervention: Clarification, Education, Problem-solving, Socialization, Activity, Encouragement and Support   Summary of Progress/Problems: Pt actively participated in group by listening attentively and self disclosing.  Therapist acknowledged one patient's birthday today and prompted clients to identify thing in their lives they can celebrate. Pt stated he was grateful to be around people and plans to increase his socialization after DC.  Pt stated he was very glad he had slept through the night for the first time in a long time.  Therapy was focused on elevating mood and affirming self-worth. Therapist encouraged Pt to express feelings    Therapist offered support and encouragement.  Slight progress noted.  Intervention effective.         Christen Butter 05/10/2012, 12:15 PM

## 2012-05-10 NOTE — ED Notes (Signed)
NWG:[id number]<ZH> Expected date:05/10/12<BR> Expected time:<BR> Means of arrival:<BR> Comments:<BR> M10 - 61YOM - FALL

## 2012-05-10 NOTE — ED Provider Notes (Signed)
History     CSN: [id number]  Arrival date & time 05/10/12  1745   First MD Initiated Contact with Patient 05/10/12 1754      Chief Complaint  Patient presents with  . Fall    (Consider location/radiation/quality/duration/timing/severity/associated sxs/prior treatment) Patient is a 61 y.o. male presenting with fall. The history is provided by the patient.  Fall The accident occurred less than 1 hour ago. Pertinent negatives include no numbness and no headaches.   patient [facility] for [facility]. He has a history of Huntington's disease. He states he fell. He denies dizziness or syncope. He states it was due to his Huntington's. No headache. Mild tenderness. Patient denies pain to his elbow or either knee. He reportedly told the nurse she had hip pain there. No chest or abdominal pain.  Past Medical History  Diagnosis Date  . Allergy   . GERD (gastroesophageal reflux disease)   . Huntington chorea     previously followed at [facility]- Dr Boris Sharper [phone] [name])  . Chronic diarrhea     Past Surgical History  Procedure Date  . Tonsillectomy 1959    Family History  Problem Relation Age of Onset  . Cancer Father     Lung  . Alzheimer's disease Maternal Uncle   . Stroke Maternal Uncle     Alzheimer  . Alzheimer's disease Maternal Grandfather   . Huntington's disease Maternal Grandmother   . Huntington's disease Mother     History  Substance Use Topics  . Smoking status: Never Smoker   . Smokeless tobacco: Never Used   Comment: Single, separated from wife 2003. Lives with friend of family. Moved to [name] area from [place] spring 2010 to be closer to [facility] and caregiver  . Alcohol Use: No      Review of Systems  Constitutional: Negative for chills and diaphoresis.  Respiratory: Negative for apnea.   Cardiovascular: Negative for chest pain.  Skin: Positive for wound. Negative for rash.  Neurological: Negative for numbness and headaches.      Allergies  Codeine  Home Medications   Current Outpatient Rx  Name Route Sig Dispense Refill  . AZELASTINE HCL 137 MCG/SPRAY NA SOLN Nasal Place 2 sprays into the nose 2 (two) times daily. Use in each nostril as directed    . CETIRIZINE HCL 10 MG PO TABS Oral Take 10 mg by mouth daily.     Marland Kitchen FLUOXETINE HCL 40 MG PO CAPS Oral Take 40 mg by mouth daily.    Marland Kitchen FLUTICASONE PROPIONATE 50 MCG/ACT NA SUSP Nasal Place 2 sprays into the nose daily.    Marland Kitchen LOPERAMIDE HCL 2 MG PO TABS Oral Take 8 mg by mouth every morning.     . MOMETASONE FUROATE 50 MCG/ACT NA SUSP Nasal 2 sprays by Nasal route daily.        BP 121/76  Pulse 76  Temp 98.2 F (36.8 C) (Oral)  Resp 16  Ht 6' (1.829 m)  Wt 185 lb (83.915 kg)  BMI 25.09 kg/m2  SpO2 100%  Physical Exam  Nursing note and vitals reviewed. Constitutional: He is oriented to person, place, and time. He appears well-developed and well-nourished.  HENT:  Head: Normocephalic.       Abrasion to right forehead. No tenderness. No step-off or coronary. Minimal ecchymosis to left lateral periorbital area.  Eyes: EOM are normal. Pupils are equal, round, and reactive to light.  Neck: Normal range of motion. Neck supple.  Cardiovascular: Normal  rate, regular rhythm and normal heart sounds.   No murmur heard. Pulmonary/Chest: Effort normal and breath sounds normal.  Abdominal: Soft. Bowel sounds are normal. He exhibits no distension and no mass. There is no tenderness. There is no rebound and no guarding.  Musculoskeletal: Normal range of motion. He exhibits no edema.  Neurological: He is alert and oriented to person, place, and time. No cranial nerve deficit.       Patient has movement disorder consist witk Huntington's.  Skin: Skin is warm and dry.  Psychiatric: He has a normal mood and affect.    ED Course  Procedures (including critical care time)  Labs Reviewed  VITAMIN B12 - Abnormal; Notable for the following:    Vitamin B-12 1485 (*)      All other components within normal limits  TSH  T4, FREE  LIPID PANEL  RPR   No results found.   1. Major depressive disorder, recurrent   2. Huntington's chorea   3. Fall   4. Forehead abrasion       MDM  Patient with a fall at behavioral health hospital. Fall likely related to patient's movement disorder. No apparent severe head injury. No other apparent fracture. Patient be discharged back for further treatment. He appears to medically cleared from the fall.        Juliet Rude. Rubin Payor, MD 05/10/12 1921

## 2012-05-10 NOTE — ED Notes (Signed)
Per ems: pt is from [facility]. Pt has history of wrist cutting x2 weeks ago. Pt had an un-witness fall in the shower. Pt denies any loc. Pt has bruise on his right forehead. Pt denies headache just c/o of tenderness. Pt also c/o left eye pain and right elbow. Pt denies pain anywhere else.pt is on spine board

## 2012-05-10 NOTE — Progress Notes (Signed)
Pt returned to [facility] from the [facility] at 2020 where he was assessed after falling in his room getting out of the shower.  He has an abrasion to his R forehead.  He reported that he had some hip and wrist pain prior to going to the ED, but he denies pain now.   Report that was called by Stark Jock, RN, states pt has no severe injuries from the fall.  He is asking about his meds.  Informed pt he is not due for meds until 2200.  Pt voices understanding.  He denies SI/HI/AV at this time.  He voices no other needs/concerns.  Safety maintained with q15 minute checks.

## 2012-05-10 NOTE — Progress Notes (Signed)
Patient has been active on the unit today. He is very pleasant and cooperative during interactions. Patient has constant muscle jerking from Huntington's chorea. He was getting out of the shower this afternoon and fell. MHT was present right away and reported no LOC. He remained alert and was evaluated by the PA on staff. Patient was sent to the [facility] ED for evaluation this afternoon around 17:15. He was calm and was in NAD. Noted to have minimally bleeding abrasion to forehead. He complained of mild headache and pain in right wrist. Was sent to ED with MHT and paperwork.

## 2012-05-10 NOTE — ED Notes (Signed)
[id number]> Expected date:<BR> Expected time: 5:48 PM<BR> Means of arrival:<BR> Comments:<BR> M50 - 58yoF Dizzy, syncope yesterday

## 2012-05-10 NOTE — Tx Team (Signed)
Interdisciplinary Treatment Plan Update (Adult)  Date:  05/10/2012  Time Reviewed:  10:15AM-11:15AM  Progress in Treatment: Attending groups:  Yes Participating in groups:  Yes, fully engaged     Taking medication as prescribed:    Yes, no refusals Tolerating medication:   Yes, no side effects reported by patient or noted by staff Family/Significant other contact made:  Yes Patient understands diagnosis:   Yes, limited Discussing patient identified problems/goals with staff:  Yes, fully  Medical problems stabilized or resolved:   No, still working on better control of the arm movements Denies suicidal/homicidal ideation:  Yes Issues/concerns per patient self-inventory:   None Other:    New problem(s) identified: No, Describe:    Reason for Continuation of Hospitalization: Delusions  Depression Medical Issues Medication stabilization Other; describe decisions re power of attorney to be made  Interventions implemented related to continuation of hospitalization:  Medication monitoring and adjustment, safety checks Q15 min., suicide risk assessment, group therapy, psychoeducation, collateral contact, aftercare planning, ongoing physician assessments, medication education  Additional comments:  Not applicable  Estimated length of stay:  3-4 days  Discharge Plan:  Return to live with [name], follow up may be with his neurologist  New goal(s):  Not applicable  Review of initial/current patient goals per problem list:   1.  Goal(s):  Medication stabilization  Met:  No  Target date:  By Discharge   As evidenced by:  Ongoing  2.  Goal(s):  Decrease depression to no greater than 3 at discharge.  Met:  No  Target date:  By Discharge   As evidenced by:  5-6 today  3.  Goal(s):  Work on plans to reduce isolation at discharge.  Met:  No  Target date:  By Discharge   As evidenced by:  Still working in groups on this  4.  Goal(s):  Deny SI for 48 hours prior to D/C.  Met:   Yes  Target date:  By Discharge   As evidenced by:  Has been denying 2 days  Attendees: Patient:  Eric Waters  05/10/2012 10:15AM-11:15AM  Family:     Physician:  Dr. Harvie Heck Readling 05/10/2012 10:15AM-11:15AM  Nursing:   Neill Loft, RN 05/10/2012 10:15AM -11:15AM   Case Manager:  [name], LCSW 05/10/2012 10:15AM-11:15AM  Counselor:  Marni Griffon, LCAS 05/10/2012 10:15AM-11:15AM  Other:     Other:      Other:      Other:       Scribe for Treatment Team:   Sarina Ser, 05/10/2012, 10:15AM-11:15AM

## 2012-05-10 NOTE — Discharge Planning (Signed)
Met with patient in Aftercare Planning Group.   He stated he slept like a baby last night for the first time since arriving.  Case Manager gave telephone number for patient's neurologist to doctor to follow up.  This is Dr. Dan Humphreys at [facility], [phone].  No case management needs today other than utilization review, which was done.  [name], LCSW 05/10/2012, 4:48 PM

## 2012-05-10 NOTE — ED Notes (Signed)
Meal given

## 2012-05-10 NOTE — Progress Notes (Signed)
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   The patient was seen today and reports the following:   ADL's: Intact.  Sleep: The patient reports to sleeping very well last night for the first time in many nights.  Appetite: The patient reports a good appetite today.   Mild>(1-10) >Severe  Hopelessness (1-10): 0  Depression (1-10): 2  Anxiety (1-10): 0   Suicidal Ideation: The patient adamantly denies any suicidal ideations today.  Plan: No  Intent: No  Means: No   Homicidal Ideation: The patient adamantly denies any homicidal ideations today.  Plan: No  Intent: No.  Means: No   General Appearance/Behavior: The patient remained cooperative today with this provider.  Eye Contact: Good.  Speech: Appropriate in rate and volume with no pressuring noted today.  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  Level of Consciousness: Alert and Oriented x 3.  Mental Status: Alert and Oriented x 3.  Mood: Appears essentially euthymic today but reports mild depression.  Affect: Appears bright and full today.  Anxiety Level: No anxiety reported today.  Thought Process: wnl.  Thought Content: The patient denies any auditory or visual hallucinations today as well as any delusions thinking.  There are family concerns that he may be having some paranoid thoughts about his POA.  Perception: wnl.  Judgment: Fair.  Insight: Fair.  Cognition: Oriented to person, place and time.  Sleep:  Number of Hours: 6.75    Vital Signs:Blood pressure 164/89, pulse 87, temperature 98.2 F (36.8 C), temperature source Oral, resp. rate 18, height 6' (1.829 m), weight 83.915 kg (185 lb), SpO2 100.00%. Current Medications: Current Facility-Administered Medications  Medication Dose Route Frequency Provider Last Rate Last Dose  . acetaminophen (TYLENOL) tablet 650  mg  650 mg Oral Q6H PRN Verne Spurr, PA-C      . alum & mag hydroxide-simeth (MAALOX/MYLANTA) 200-200-20 MG/5ML suspension 30 mL  30 mL Oral Q4H PRN Verne Spurr, PA-C      . azelastine (ASTELIN) nasal spray 2 spray  2 spray Each Nare BH-qamhs Ronny Bacon, MD   2 spray at 05/10/12 0918  . cephALEXin (KEFLEX) capsule 500 mg  500 mg Oral BH-qamhs Curlene Labrum Myka Lukins, MD   500 mg at 05/10/12 0923  . clonazePAM (KLONOPIN) tablet 0.5 mg  0.5 mg Oral BH-qamhs Suhani Stillion D Amari Burnsworth, MD   0.5 mg at 05/10/12 0920  . FLUoxetine (PROZAC) capsule 40 mg  40 mg Oral Daily Curlene Labrum Damaris Abeln, MD   40 mg at 05/10/12 0919  . fluticasone (FLONASE) 50 MCG/ACT nasal spray 2 spray  2 spray Each Nare Daily Curlene Labrum Gailya Tauer, MD   2 spray at 05/10/12 0918  . haloperidol (HALDOL) tablet 0.5 mg  0.5 mg Oral Q2200 Curlene Labrum Latice Waitman, MD      . loperamide (IMODIUM) capsule 8 mg  8 mg Oral BH-q7a Curlene Labrum Edythe Riches, MD   8 mg at 05/10/12 0981  . loratadine (CLARITIN) tablet 10 mg  10 mg Oral Daily Curlene Labrum Kalijah Westfall, MD   10 mg at 05/10/12 0920  . magnesium hydroxide (MILK OF MAGNESIA) suspension 30 mL  30 mL Oral Daily PRN Verne Spurr, PA-C      . traZODone (DESYREL) tablet 75 mg  75 mg Oral QHS Ronny Bacon, MD   75 mg at 05/09/12 2134   Lab Results:  Results for orders placed  during the hospital encounter of 05/07/12 (from the past 48 hour(s))  TSH     Status: Normal   Collection Time   05/09/12  6:30 AM      Component Value Range Comment   TSH 0.717  0.350 - 4.500 uIU/mL   T4, FREE     Status: Normal   Collection Time   05/09/12  6:30 AM      Component Value Range Comment   Free T4 0.96  0.80 - 1.80 ng/dL   LIPID PANEL     Status: Normal   Collection Time   05/09/12  6:30 AM      Component Value Range Comment   Cholesterol 177  0 - 200 mg/dL    Triglycerides 56  <161 mg/dL    HDL 71  >09 mg/dL    Total CHOL/HDL Ratio 2.5      VLDL 11  0 - 40 mg/dL    LDL Cholesterol 95  0 - 99 mg/dL   VITAMIN U04     Status:  Abnormal   Collection Time   05/09/12  6:30 AM      Component Value Range Comment   Vitamin B-12 1485 (*) 211 - 911 pg/mL   RPR     Status: Normal   Collection Time   05/09/12  6:30 AM      Component Value Range Comment   RPR NON REACTIVE  NON REACTIVE    Review of Systems:  Neurological: The patient denies any headaches today. He denies any seizures or dizziness.  G.I.: The patient denies any constipation or G.I. Upset today.  Musculoskeletal: The patient denies any muscle or skeletal difficulties with the exception of his significant involuntary movements.  Skin:  The patient has bilateral superficial lacerations to his wrists.  These are clean and healing well with no infection noted.  Time was spent today discussing with the patient his current symptoms. The patient reports to sleeping very well last night for the first time on many nights. He reports a good appetite and reports mild feelings of sadness, anhedonia and depressed mood today. He denies any anxiety symptoms today and denies any auditory or visual hallucinations or delusional thinking.  There are family concerns that he may be having some paranoid thoughts about his POA.  According to collateral information from his daughter, the patient tend to accuse his POAs of not working in his best interest.  This reportedly also occurred when the patient's daughter was his POA.  Time was spent discussing with the patient the possibility of starting low dose Haldol which may help with his involuntary movements as well as with any paranoid thinking and the patient agreed with this plan.  Treatment Plan Summary:  1. Daily contact with patient to assess and evaluate symptoms and progress in treatment.  2. Medication management  3. The patient will deny suicidal ideations or homicidal ideations for 48 hours prior to discharge and have a depression and anxiety rating of 3 or less. The patient will also deny any auditory or visual hallucinations  or delusional thinking.  4. The patient will deny any symptoms of substance withdrawal at time of discharge.   Plan:  1. Will continue the medication Prozac at 40 mgs po q am for depression.  2. Will continue the medication Klonopin at 0.5 mgs po q am and hs which may help reduce his involuntary movements related to his Huntington's Disease.  3. Will start the medication Haldol at 0.5 mgs po  qhs which may also help with the patient's involuntary movements related to his Huntington's Disease as well as any paranoid delusions related to his POA.  This will be started as a very low test dosage prior to increasing the medication. 4. Will continue the medication Trazodone at 75 mgs po qhs for sleep.  5. Will continue the medication Keflex at 500 mgs po q am and hs x 10 days for cellulitis of wrists.  This was started on 05/09/2012. 6. Laboratory studies reviewed.  7. Will continue to monitor.   Gamble Enderle 05/10/2012, 5:05 PM

## 2012-05-11 MED ORDER — HALOPERIDOL 2 MG PO TABS
2.0000 mg | ORAL_TABLET | Freq: Every day | ORAL | Status: DC
  Administered 2012-05-11 – 2012-05-22 (×12): 2 mg via ORAL
  Filled 2012-05-11 (×7): qty 1
  Filled 2012-05-11: qty 14
  Filled 2012-05-11 (×6): qty 1
  Filled 2012-05-11: qty 2

## 2012-05-11 NOTE — Progress Notes (Signed)
Patient ID: Eric Waters, male   DOB: [birth year], 61 y.o.   MRN: [id number]   D: Patient lying in bed this am on approach today. Patient states mood much improved. Gives depression and hopelessness "0". Denies any SI, HI or a/v hallucinations. 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 A: Staff will monitor on q 15 minute checks and encourage group attendance. Will also ask treatment team about a plan d/t fall from medical issue. R. Patient cooperative and took meds without issue.

## 2012-05-11 NOTE — CV Procedure (Signed)
D) Patient resting quietly in room. Patient denies SI/HI, denies A/V hallucinations. A) Patient offered support and encouragement. Patient currently on 1:1 observation or safety. R) Patient remains safe on unit. Denies any needs or concerns at this time. Will continue to monitor.

## 2012-05-11 NOTE — Progress Notes (Signed)
Pt lying in his bed.  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  His involuntary movements seem to have increased and become more animated than on Monday evening when this writer had him for a patient.  He is pleasant/cooperative.  The areas on his wrists are dry and open to area.  He denies SIHI/AV.  He voices no complaints at this time.  Pt continues on 1:1 for safety.  Sitter at bedside.

## 2012-05-11 NOTE — Progress Notes (Signed)
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   The patient was seen today and reports the following:   ADL's: Intact.  Sleep: The patient reports to once again sleeping very well last night.  Appetite: The patient reports a good appetite today.   Mild>(1-10) >Severe  Hopelessness (1-10): 0  Depression (1-10): 0  Anxiety (1-10): 0   Suicidal Ideation: The patient again adamantly denies any suicidal ideations today.  Plan: No  Intent: No  Means: No   Homicidal Ideation: The patient again adamantly denies any homicidal ideations today.  Plan: No  Intent: No.  Means: No   General Appearance/Behavior: The patient remains cooperative today with this provider.  Eye Contact: Good.  Speech: Appropriate in rate and volume with no pressuring noted today.  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  Level of Consciousness: Alert and Oriented x 3.  Mental Status: Alert and Oriented x 3.  Mood: Essentially euthymic today.  Affect: Appears bright and full today.  Anxiety Level: No anxiety reported today.  Thought Process: wnl.  Thought Content: The patient denies any auditory or visual hallucinations today as well as any delusions thinking. There are concerns that he may be having some paranoid thoughts about his POA.  Perception: wnl.  Judgment: Fair.  Insight: Fair.  Cognition: Oriented to person, place and time.  Sleep:  Number of Hours: 6.75    Vital Signs:Blood pressure 127/76, pulse 65, temperature 98 F (36.7 C), temperature source Oral, resp. rate 16, height 6' (1.829 m), weight 83.915 kg (185 lb), SpO2 98.00%.  Current Medications: Current Facility-Administered Medications  Medication Dose Route Frequency Provider Last Rate Last Dose  . acetaminophen (TYLENOL) tablet 650 mg  650 mg Oral Q6H PRN Verne Spurr, PA-C      . alum & mag hydroxide-simeth (MAALOX/MYLANTA) 200-200-20 MG/5ML suspension 30 mL  30 mL Oral Q4H PRN Verne Spurr, PA-C      . azelastine (ASTELIN) nasal spray 2 spray  2 spray Each Nare BH-qamhs Ronny Bacon, MD   2 spray at 05/11/12 0817  . cephALEXin (KEFLEX) capsule 500 mg  500 mg Oral BH-qamhs Curlene Labrum Etheline Geppert, MD   500 mg at 05/11/12 0817  . clonazePAM (KLONOPIN) tablet 0.5 mg  0.5 mg Oral BH-qamhs Curlene Labrum Bronsen Serano, MD   0.5 mg at 05/11/12 0817  . FLUoxetine (PROZAC) capsule 40 mg  40 mg Oral Daily Curlene Labrum Stephanie Mcglone, MD   40 mg at 05/11/12 0817  . fluticasone (FLONASE) 50 MCG/ACT nasal spray 2 spray  2 spray Each Nare Daily Curlene Labrum Arlita Buffkin, MD   2 spray at 05/11/12 0817  . haloperidol (HALDOL) tablet 2 mg  2 mg Oral Q2200 Bomani Oommen D Tigerlily Christine, MD      . loperamide (IMODIUM) capsule 8 mg  8 mg Oral BH-q7a Curlene Labrum Estelle Greenleaf, MD   8 mg at 05/11/12 0618  . loratadine (CLARITIN) tablet 10 mg  10 mg Oral Daily Curlene Labrum Tyshauna Finkbiner, MD   10 mg at 05/11/12 1610  . magnesium hydroxide (MILK OF MAGNESIA) suspension 30 mL  30 mL Oral Daily PRN Verne Spurr, PA-C      . traZODone (DESYREL) tablet 75 mg  75 mg Oral QHS Curlene Labrum Fontella Shan, MD   75 mg at 05/10/12 2145  . DISCONTD: haloperidol (HALDOL) tablet 0.5 mg  0.5 mg Oral Q2200 Harvie Heck  D Mikenna Bunkley, MD   0.5 mg at 05/10/12 2146   Lab Results: No results found for this or any previous visit (from the past 48 hour(s)).  Review of Systems:  Neurological: The patient denies any headaches today. He denies any seizures or dizziness.  G.I.: The patient denies any constipation or G.I. Upset today.  Musculoskeletal: The patient denies any muscle or skeletal difficulties with the exception of his significant involuntary movements.  Skin: The patient has bilateral superficial lacerations to his wrists. These are clean and healing well with no infection noted. He also has an abrasion on his forehead from his fall last night.  Time was spent today  discussing with the patient his current symptoms. The patient reports to sleeping very well last night and reports a good appetite.  He denies any significant feelings of sadness, anhedonia or depressed mood today. He denies any anxiety symptoms today and denies any auditory or visual hallucinations or delusional thinking. There remains concerns that he may be having some paranoid thoughts about his POA.   Time was also spent discussing with the patient his fall last night.  The patient states that he slipped getting out of the shower and fell hitting his head.  The patient was evaluated by the ED and was medically cleared.  It was discussed with the patient the possibility of placing the patient on a 1 to 1 for fall risk.  The patient was reluctant but did agree to the assistance.  Treatment Plan Summary:  1. Daily contact with patient to assess and evaluate symptoms and progress in treatment.  2. Medication management  3. The patient will deny suicidal ideations or homicidal ideations for 48 hours prior to discharge and have a depression and anxiety rating of 3 or less. The patient will also deny any auditory or visual hallucinations or delusional thinking.  4. The patient will deny any symptoms of substance withdrawal at time of discharge.   Plan:  1. Will continue the medication Prozac at 40 mgs po q am for depression.  2. Will continue the medication Klonopin at 0.5 mgs po q am and hs which may help reduce his involuntary movements related to his Huntington's Disease.  3. Will increase the medication Haldol to 2 mgs po qhs which may also help with the patient's involuntary movements related to his Huntington's Disease as well as any paranoid delusions related to his POA. This will be started as a very low test dosage prior to increasing the medication.  4. Will continue the medication Trazodone at 75 mgs po qhs for sleep.  5. Will continue the medication Keflex at 500 mgs po q am and hs x 10 days  for cellulitis of wrists. This was started on 05/09/2012.  6. Laboratory studies reviewed.  7. Will place the patient on a 1 to 1 for a fall risk. 8. Will continue to monitor.   Ashiah Karpowicz 05/11/2012, 8:16 PM

## 2012-05-11 NOTE — Progress Notes (Signed)
[facility] Group Notes:  (Counselor/Nursing/MHT/Case Management/Adjunct)  05/11/2012 2:30 PM  Type of Therapy:  [employer] Support  Participation Level:  Did not attend    Eric Waters C 05/11/2012, 2:30 PM   

## 2012-05-11 NOTE — Progress Notes (Signed)
[facility] Group Notes:  (Counselor/Nursing/MHT/Case Management/Adjunct)  05/11/2012 12:30 PM  Type of Therapy: Group Therapy   Participation Level: Minimal   Participation Quality:  Sharing, Attentive   Affect: Blunted, Jerky motions  Cognitive:  Oriented, Alert  Insight: Poor  Engagement in Group: Limited  Modes of Intervention: Clarification, Education, Problem-solving, Socialization and Support   Summary of Progress/Problems: Pt participated in group by listening attentively and self disclosing.  Therapist prompted Pts to report on progress with goals.  Pt acknowledged some progress.  Therapist prompted Pt to express feelings openly,  identify and work on the underlying issue.  Pt explained that he had fallen last night but he was not hurt and was feeling fine.  Therapist informed Pt we were arranging for a 1:1 tech to be with him to assist in the event he became off balance again.  Pt was very pleased with this news.  Therapist offered support and encouragement.  Minimal Progress noted.  Intervention effective.    Marni Griffon 05/11/2012  12:30 pm

## 2012-05-12 NOTE — Progress Notes (Signed)
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   The patient was seen today and reports the following:   ADL's: Intact.  Sleep: The patient reports to once again sleeping very well last night.  Appetite: The patient reports a good appetite today.   Mild>(1-10) >Severe  Hopelessness (1-10): 0  Depression (1-10): 0  Anxiety (1-10): 0   Suicidal Ideation: The patient again adamantly denies any suicidal ideations today.  Plan: No  Intent: No  Means: No   Homicidal Ideation: The patient again adamantly denies any homicidal ideations today.  Plan: No  Intent: No.  Means: No   General Appearance/Behavior: The patient remains cooperative today with this provider.  Eye Contact: Good.  Speech: Appropriate in rate and volume with no pressuring noted today.  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  Level of Consciousness: Alert and Oriented x 3.  Mental Status: Alert and Oriented x 3.  Mood: Essentially euthymic today.  Affect: Appears bright and full today.  Anxiety Level: No anxiety reported today.  Thought Process: wnl.  Thought Content: The patient denies any auditory or visual hallucinations today as well as any delusions thinking. There continues to be negative thinking related to his POA.  Perception: wnl.  Judgment: Fair.  Insight: Fair.  Cognition: Oriented to person, place and time.  Sleep:  Number of Hours: 6    Vital Signs:Blood pressure 115/64, pulse 60, temperature 97.4 F (36.3 C), temperature source Oral, resp. rate 16, height 6' (1.829 m), weight 83.915 kg (185 lb), SpO2 98.00%.  Current Medications: Current Facility-Administered Medications  Medication Dose Route Frequency Provider Last Rate Last Dose  . acetaminophen (TYLENOL) tablet 650 mg  650 mg Oral Q6H PRN Verne Spurr, PA-C      . alum & mag  hydroxide-simeth (MAALOX/MYLANTA) 200-200-20 MG/5ML suspension 30 mL  30 mL Oral Q4H PRN Verne Spurr, PA-C      . azelastine (ASTELIN) nasal spray 2 spray  2 spray Each Nare BH-qamhs Ronny Bacon, MD   2 spray at 05/12/12 0817  . cephALEXin (KEFLEX) capsule 500 mg  500 mg Oral BH-qamhs Curlene Labrum Sargon Scouten, MD   500 mg at 05/12/12 0815  . FLUoxetine (PROZAC) capsule 40 mg  40 mg Oral Daily Curlene Labrum Doneisha Ivey, MD   40 mg at 05/12/12 0815  . fluticasone (FLONASE) 50 MCG/ACT nasal spray 2 spray  2 spray Each Nare Daily Curlene Labrum Solae Norling, MD   2 spray at 05/12/12 0817  . haloperidol (HALDOL) tablet 2 mg  2 mg Oral Q2200 Curlene Labrum Maikayla Beggs, MD   2 mg at 05/11/12 2240  . loperamide (IMODIUM) capsule 8 mg  8 mg Oral BH-q7a Curlene Labrum Hurley Sobel, MD   8 mg at 05/12/12 0609  . loratadine (CLARITIN) tablet 10 mg  10 mg Oral Daily Curlene Labrum Sahiba Granholm, MD   10 mg at 05/12/12 0820  . magnesium hydroxide (MILK OF MAGNESIA) suspension 30 mL  30 mL Oral Daily PRN Verne Spurr, PA-C      . traZODone (DESYREL) tablet 75 mg  75 mg Oral QHS Curlene Labrum Sueo Cullen, MD   75 mg at 05/11/12 2240  . DISCONTD: clonazePAM (KLONOPIN) tablet 0.5 mg  0.5 mg Oral BH-qamhs Curlene Labrum Ediel Unangst, MD   0.5 mg at 05/12/12 0816  . DISCONTD: haloperidol (HALDOL) tablet 0.5 mg  0.5 mg Oral Q2200 Dorothy Landgrebe D Jasmyne Lodato,  MD   0.5 mg at 05/10/12 2146   Lab Results: No results found for this or any previous visit (from the past 48 hour(s)).  Review of Systems:  Neurological: The patient denies any headaches today. He denies any seizures or dizziness.  G.I.: The patient denies any constipation or G.I. Upset today.  Musculoskeletal: The patient denies any muscle or skeletal difficulties with the exception of his significant involuntary movements.  Skin: The patient has bilateral superficial lacerations to his wrists. These are clean and healing well with no infection noted. He also has an abrasion on his forehead which is also healing.   Time was spent today  discussing with the patient his current symptoms. The patient reports to sleeping very well last night and reports a good appetite. He denies any significant feelings of sadness, anhedonia or depressed mood today.  He denies any anxiety symptoms today and denies any auditory or visual hallucinations or delusional thinking. He also adamantly denies any suicidal or homicidal ideations.  Negative thoughts related to his POA continue.  The patient also displays a marked reduction in his involuntary movements.  He does appear a little sedated this morning and the medication Klonopin will be discontinued.   Treatment Plan Summary:  1. Daily contact with patient to assess and evaluate symptoms and progress in treatment.  2. Medication management  3. The patient will deny suicidal ideations or homicidal ideations for 48 hours prior to discharge and have a depression and anxiety rating of 3 or less. The patient will also deny any auditory or visual hallucinations or delusional thinking.  4. The patient will deny any symptoms of substance withdrawal at time of discharge.   Plan:  1. Will continue the medication Prozac at 40 mgs po q am for depression.  2. Will discontinue the medication Klonopin today due to oversedation. 3. Will continue the medication Haldol to 2 mgs po qhs to help with the patient's involuntary movements related to his Huntington's Disease as well as any paranoid delusions. 4. Will continue the medication Trazodone at 75 mgs po qhs for sleep.  5. Will continue the medication Keflex at 500 mgs po q am and hs x 10 days for cellulitis of wrists. This was started on 05/09/2012.  6. Laboratory studies reviewed.  7. Will continue the patient on a 1 to 1 for a fall risk.  8. Will continue to monitor.    Masen Luallen 05/12/2012, 4:40 PM

## 2012-05-12 NOTE — Progress Notes (Signed)
Patient ID: Eric Waters, male   DOB: 03/21/1951, 61 y.o.   MRN: [id number] See DAR notes on paper chart.

## 2012-05-12 NOTE — Progress Notes (Signed)
D: Pt in karaoke accompanied by sitter. Pt appropriate. Pt singing and laughing with other pt's. No distress noted. A: Continue 1:1 precautions for pt safety due to fall risk. R: Pt remains safe.

## 2012-05-12 NOTE — Progress Notes (Signed)
[facility] Group Notes:  (Counselor/Nursing/MHT/Case Management/Adjunct)  05/12/2012 10:30 AM  Type of Therapy: Group Therapy   Participation Level: Did not attend     Christen Bedoya 05/12/2012  10:30 AM      

## 2012-05-12 NOTE — Progress Notes (Signed)
D: Pt asleep. Eyes closed. Respirations even and unlabored. Sitter at bedside. No distress noted. A: Continue 1:1 safety precautions for pt safety due to fall risk. R: Pt remains safe on the unit.

## 2012-05-13 NOTE — Progress Notes (Signed)
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 No changes from previous assessment.  A-Bright,appropriate, and cooperative. Spoke of anticipation of positive d/c.  R- Continue 1:1 for safety. No physical complaints. Remains safe.

## 2012-05-13 NOTE — Progress Notes (Signed)
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   The patient was seen today and reports the following:   ADL's: Intact.  Sleep: The patient reports to once again sleeping very well last night but according to staff he slept only 4 hours.  Appetite: The patient reports a good appetite today.   Mild>(1-10) >Severe  Hopelessness (1-10): 0  Depression (1-10): 0  Anxiety (1-10): 3   Suicidal Ideation: The patient adamantly denies any suicidal ideations today.  Plan: No  Intent: No  Means: No  Homicidal Ideation: The patient adamantly denies any homicidal ideations today.  Plan: No  Intent: No.  Means: No   General Appearance/Behavior: The patient remains cooperative today with this provider.  Eye Contact: Good.  Speech: Appropriate in rate and volume with no pressuring noted today.  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  Level of Consciousness: Alert and Oriented x 3.  Mental Status: Alert and Oriented x 3.  Mood: Essentially euthymic today.  Affect: Appears bright and full today.  Anxiety Level: Mild anxiety reported today.  Thought Process: wnl.  Thought Content: The patient denies any auditory or visual hallucinations today as well as any delusions thinking. There continues to be negative thinking related to his POA.  Perception: wnl.  Judgment: Fair.  Insight: Fair.  Cognition: Oriented to person, place and time.  Sleep:  Number of Hours: 4    Vital Signs:Blood pressure 110/70, pulse 65, temperature 97.9 F (36.6 C), temperature source Oral, resp. rate 16, height 6' (1.829 m), weight 83.915 kg (185 lb), SpO2 98.00%.  Current Medications: Current Facility-Administered Medications  Medication Dose Route Frequency Provider Last Rate Last Dose  . acetaminophen (TYLENOL) tablet 650 mg  650 mg Oral Q6H PRN Verne Spurr, PA-C      . alum & mag hydroxide-simeth (MAALOX/MYLANTA) 200-200-20 MG/5ML suspension 30 mL  30 mL Oral Q4H PRN Verne Spurr, PA-C      . azelastine (ASTELIN) nasal spray 2 spray  2 spray Each Nare BH-qamhs Ronny Bacon, MD   2 spray at 05/13/12 0839  . cephALEXin (KEFLEX) capsule 500 mg  500 mg Oral BH-qamhs Curlene Labrum Carline Dura, MD   500 mg at 05/13/12 0841  . FLUoxetine (PROZAC) capsule 40 mg  40 mg Oral Daily Curlene Labrum Elberta Lachapelle, MD   40 mg at 05/13/12 0841  . fluticasone (FLONASE) 50 MCG/ACT nasal spray 2 spray  2 spray Each Nare Daily Curlene Labrum Misty Rago, MD   2 spray at 05/13/12 0840  . haloperidol (HALDOL) tablet 2 mg  2 mg Oral Q2200 Curlene Labrum Berry Godsey, MD   2 mg at 05/12/12 2214  . loperamide (IMODIUM) capsule 8 mg  8 mg Oral BH-q7a Curlene Labrum Zniyah Midkiff, MD   8 mg at 05/12/12 0609  . loratadine (CLARITIN) tablet 10 mg  10 mg Oral Daily Curlene Labrum Shanitha Twining, MD   10 mg at 05/13/12 0841  . magnesium hydroxide (MILK OF MAGNESIA) suspension 30 mL  30 mL Oral Daily PRN Verne Spurr, PA-C      . traZODone (DESYREL) tablet 75 mg  75 mg Oral QHS Curlene Labrum Varonica Siharath, MD   75 mg at 05/12/12 2214   Lab Results: No results found for this or any previous visit (from the past 48 hour(s)).  Review of Systems:  Neurological: The patient denies any headaches today. He denies any seizures  or dizziness.  G.I.: The patient denies any constipation or G.I. Upset today.  Musculoskeletal: The patient denies any muscle or skeletal difficulties with the exception of his significant involuntary movements.  Skin: The patient has bilateral superficial lacerations to his wrists. These are clean and healing well with no infection noted. He also has an abrasion on his forehead which is also healing.   Time was spent today discussing with the patient his current symptoms. The patient reports to sleeping well last night but according to staff he slept only 4 hours.  He reports a good appetite and denies any significant  feelings of sadness, anhedonia or depressed mood today. He does report mild anxiety symptoms today and denies any auditory or visual hallucinations or delusional thinking. He also adamantly denies any suicidal or homicidal ideations. Negative thoughts related to his POA continue with the patient refusing to allow the treatment team to speak with his POA today.   Treatment Plan Summary:  1. Daily contact with patient to assess and evaluate symptoms and progress in treatment.  2. Medication management  3. The patient will deny suicidal ideations or homicidal ideations for 48 hours prior to discharge and have a depression and anxiety rating of 3 or less. The patient will also deny any auditory or visual hallucinations or delusional thinking.  4. The patient will deny any symptoms of substance withdrawal at time of discharge.   Plan:  1. Will continue the medication Prozac at 40 mgs po q am for depression.  2. Will continue the medication Haldol at 2 mgs po qhs to help with the patient's involuntary movements related to his Huntington's Disease as well as any paranoid delusions.  3. Will continue the medication Trazodone at 75 mgs po qhs for sleep.  4. Will continue the medication Keflex at 500 mgs po q am and hs x 10 days for cellulitis of wrists. This was started on 05/09/2012.  5. Laboratory studies reviewed.  6. Will continue the patient on a 1 to 1 for a fall risk.  7. Will continue to monitor.   Caymen Dubray 05/13/2012, 8:32 PM

## 2012-05-13 NOTE — Discharge Planning (Signed)
Met with patient in Aftercare Planning Group.    He reported that he wants to stay in the hospital until his antibiotics course is finished, as he thinks that is helping him to feel better.  He said he believes that the infection is the cause of him feeling bad, not the Huntington's Disease.  He again refused to allow Case Manager ("CM") permission to talk with his Power of [place], and said he has a long-time friend named Sanda Klein who would be willing to be his POA.  CM challenged him gently that perhaps his POA working with his daughter and son-in-law would make decisions that he liked better.  He said he really wants his daughter to take the position, but CM reminded him that she lives in [place] and is not close enough to help intensively.  Patient did come to Treatment Team, see update for details.  CM called patient's daughter at her request too provide an update.  The family is very hopeful that patient will allow Tori Milks to continue in the POA role with their support.  She verbalized understanding that patient is likely to discharge early next week to his home with roommate, and that the family will need to continue to work on this situation after discharge, that it does not have to be resolved while he is at [facility].  Daughter will call and discuss this with patient.  No more case management needs today.  [name], LCSW 05/13/2012, 4:21 PM

## 2012-05-13 NOTE — Progress Notes (Signed)
D-Pt is bright and appropriate this shift. Compliant with scheduled medication protocol and no prn medications requested.  A- Rates depression and hopelessness at 8. Denies SI/HI. Reports good sleep and appetite. No physical complaints.  Energy is reported as "low".  R- Fall risks and 1:1 maintained for safety. Support and encouragement given. Continue current POC. Remains safe.

## 2012-05-13 NOTE — Progress Notes (Signed)
Psychoeducational Group Note  Date:  05/13/2012 Time:  2040  Group Topic/Focus:  Wrap-Up Group:   The focus of this group is to help patients review their daily goal of treatment and discuss progress on daily workbooks.  Participation Level:  Active  Participation Quality:  Attentive and Sharing  Affect:  Appropriate  Cognitive:  Appropriate  Insight:  Good  Engagement in Group:  Good  Additional Comments:  Pt attended wrap up group this evening.  Pt participated in group sharing about having a good day and needing a cough drop for his throat.  Aundria Rud, Carver Murakami L 05/13/2012, 8:40 PM

## 2012-05-13 NOTE — Progress Notes (Signed)
Patient ID: Eric Waters, male   DOB: [birth year], 61 y.o.   MRN: [id number] 1:1 Nursing Note: The patient is bright and pleasant. Offered no complaints. Spoke about his son and how proud he is of him. States he has his discharge plans all in place. Plans on returning to live with his roommate. States his family is coming to visit this weekend and he is hoping discharge arrangements can be made. 1:1 maintained for safety as a fall risk. Will continue to monitor.

## 2012-05-13 NOTE — Progress Notes (Signed)
Pt has been in bed resting with eyes closed  No complaints voiced  Pt is on one to one sitter for safety due to very unsteady gait  Pt is safe at present

## 2012-05-13 NOTE — Progress Notes (Signed)
[facility] Group Notes:  (Counselor/Nursing/MHT/Case Management/Adjunct)  05/13/2012 2:30 pm   Type of Therapy: Group Therapy   Participation Level:  Did not attend       Christen Butter 05/13/2012, 2:30 PM

## 2012-05-13 NOTE — Tx Team (Signed)
Interdisciplinary Treatment Plan Update (Adult)  Date:  05/13/2012  Time Reviewed:  10:15AM-11:15AM  Progress in Treatment: Attending groups:  Yes, for the most part Participating in groups:    Yes Taking medication as prescribed:    Yes Tolerating medication:   Yes Family/Significant other contact made:  Yes Patient understands diagnosis:   Yes, poor insight, poor judgment Discussing patient identified problems/goals with staff:   Yes Medical problems stabilized or resolved:   Huntington's Disease ongoing, is on 1:1 for fall risk due to movement disorder Denies suicidal/homicidal ideation:  Yes Issues/concerns per patient self-inventory:   None Other:    New problem(s) identified: None  Reason for Continuation of Hospitalization: Medication stabilization Other; describe placement issues  Interventions implemented related to continuation of hospitalization:  Medication monitoring and adjustment, safety checks Q15 min., suicide risk assessment, group therapy, psychoeducation, collateral contact, aftercare planning, ongoing physician assessments, medication education, is on 1:1 for safety (fall risk)  Additional comments:  Patient continues to insist that he wants a different power of attorney  Estimated length of stay:  3 days  Discharge Plan:  Return to live with [name], follow up may be with his neurologist  New goal(s):  Not applicable  Review of initial/current patient goals per problem list:   1.  Goal(s):  Medication stabilization  Met:  No  Target date:  By Discharge   As evidenced by:  Continuing to work on this issue, as is working on dosages  2.  Goal(s):  Decrease depression to no greater than 3 at discharge.  Met:  Yes  Target date:  By Discharge   As evidenced by:  "none' today  3.  Goal(s):  Work on plans to reduce isolation at discharge.  Met:  No  Target date:  By Discharge   As evidenced by:  Has been sedated from medication, not participating as  much in groups as is preferable  4.  Goal(s):  Deny SI for 48 hours prior to D/C.  Met:  Yes  Target date:  By Discharge   As evidenced by:  Has been denying  Attendees: Patient:  Eric Waters  05/13/2012 10:15AM-11:15AM  Family:     Physician:  Dr. Harvie Heck Readling 05/13/2012 10:15AM-11:15AM  Nursing:   Manuela Schwartz, RN 05/13/2012 10:15AM -11:15AM   Case Manager:  [name], LCSW 05/13/2012 10:15AM-11:15AM  Counselor:  Marni Griffon, LCAS 05/13/2012 10:15AM-11:15AM  Other:   Clent Demark, LCAS-A 05/13/2012   Other:      Other:      Other:       Scribe for Treatment Team:   Sarina Ser, 05/13/2012, 10:15AM-11:15AM

## 2012-05-13 NOTE — Progress Notes (Signed)
D-Pt states he is coughing "plegm'. Also reports loose stools that have "been going on for a year". A- Instructed to let staff observe any loose stools. Cough lozenge given. Reported to MD. R- Appetite is good. 1:1 cont for safety. Remains safe.

## 2012-05-13 NOTE — Progress Notes (Signed)
D: PT resting in bed. Eyes closed. Respirations even and unlabored. No distress noted. A: Continue 1:1  for fall precautions. R: Pt remains safe on unit.

## 2012-05-14 NOTE — Progress Notes (Signed)
Patient ID: Rutger Salton, male   DOB: 04-17-1951, 61 y.o.   MRN: [id number]   [facility] Group Notes:  (Counselor/Nursing/MHT/Case Management/Adjunct)  05/14/2012 11 AM  Type of Therapy:  Aftercare Planning, Group Therapy, Dance/Movement Therapy   Participation Level:  Did Not Attend   Cassidi Long

## 2012-05-14 NOTE — Progress Notes (Signed)
  Eric Waters is a 61 y.o. male [id number] 09/04/1951  05/07/2012 Principal Problem:  *Major depressive disorder, recurrent Active Problems:  HUN[place]INGTONS CHOREA   Mental Status: Mood is good denies SI/HI/AVH.    Subjective/Objective: Wrists are healing from cellulitis clean& dry. Choreiform movements are less than last weekend. Continues to need 1:1 due to potential fall risk.    Filed Vitals:   05/14/12 0838  BP: 124/84  Pulse: 69  Temp:   Resp:     Lab Results:   BMET    Component Value Date/Time   NA 138 05/06/2012 2133   K 3.9 05/06/2012 2133   CL 102 05/06/2012 2133   CO2 23 05/06/2012 2133   GLUCOSE 104* 05/06/2012 2133   BUN 16 05/06/2012 2133   CREATININE 1.31 05/06/2012 2133   CALCIUM 9.6 05/06/2012 2133   GFRNONAA 58* 05/06/2012 2133   GFRAA 67* 05/06/2012 2133    Medications:  Scheduled:     . azelastine  2 spray Each Nare BH-qamhs  . cephALEXin  500 mg Oral BH-qamhs  . FLUoxetine  40 mg Oral Daily  . fluticasone  2 spray Each Nare Daily  . haloperidol  2 mg Oral Q2200  . loperamide  8 mg Oral BH-q7a  . loratadine  10 mg Oral Daily  . traZODone  75 mg Oral QHS     PRN Meds acetaminophen, alum & mag hydroxide-simeth, magnesium hydroxide Plan: Continue current plan of care.   Eric Waters,Eric D. 05/14/2012

## 2012-05-14 NOTE — Progress Notes (Signed)
Patient ID: Eric Waters, male   DOB: [birth year], 61 y.o.   MRN: [id number] 1:1 Nursing Note: The patient is little more low key this evening. Blunted affect. Attended evening wrap up group. Denies any suicidal ideation. 1:1 maintained for safety due to being a fall risk. Will continue to monitor.

## 2012-05-14 NOTE — Progress Notes (Signed)
D-Pt is appropriate and cooperative. Bright affect.  ADL's done. A- No changes from previous assessment. R- Continue 1:1 for fall risk. Reamins safe.

## 2012-05-14 NOTE — Progress Notes (Signed)
Patient ID: Eric Waters, male   DOB: Feb 06, 1951, 61 y.o.   MRN: [id number] 1:1 Nursing Note: The patient was resting in bed talking with his sitter. Offered no complaints. Encouraged to try to sleep. Remains pleasant and cooperative. 1:1 maintained for safety due to being a fall risk. Two side rails up for safety. Will continue to monitor.

## 2012-05-14 NOTE — Progress Notes (Signed)
D-Pt out and appropriate in milieu. Unsteady on feet. A-Reports poor sleep but improving appetite. Energy level is low. Rates depression and hopelessness at 8. Denies SI and contracts for safety. No abnormal respiratory symptoms observed. R- 1:1 maintained for safety. Support and encouragement given. Compliant with medications. Continue with current POC and goals for d/c.Remains safe.

## 2012-05-14 NOTE — Accreditation Note (Signed)
D-Pt out in milieu interacting A- No changes from previous 1:1 assessment. Resistant to leave unit for meals or groups. R- Strongly encouraged meals in cafeteria. Continue 1:1 for safety. Positive support and encouragemnet. Remains safe.

## 2012-05-15 NOTE — Progress Notes (Signed)
D-Pt is alert,oriented with bright affect. Attending groups with good participation. A- Rates depression and hopelessness at 5. Reports fair sleep and good appetite. Energy is low. C/O coughing and loose stools. Made pt aware that cough lozenge was available. Denies SI. Encouraged patient to not isolate in room during mealtimes. R-1:1 cont for safety. Remains safe.

## 2012-05-15 NOTE — Progress Notes (Signed)
Patient ID: Nayef College, male   DOB: 1951-06-24, 61 y.o.   MRN: [id number] 1:1 Nursing Note: The patient has a flat affect and is very subdued this evening. He describes his day as being just "okay". Stated he was feeling more anxious. Body movements were more spastic. His expressed thoughts are more tangential. It was difficult trying to follow and understand his conversation and content. Encouraged to go to evening wrap up group. 1:1 maintained for safety due to being a fall risk. Will continue to monitor.

## 2012-05-15 NOTE — Progress Notes (Signed)
[facility] Group Notes:  (Counselor/Nursing/MHT/Case Management/Adjunct)  05/15/2012 4:20 PM  Type of Therapy:  Group Therapy and discharge planning   Participation Level:  Did Not Attend    Chestine Spore, Mylene Bow L 05/15/2012, 4:20 PM

## 2012-05-15 NOTE — Progress Notes (Signed)
Patient ID: Bright Spielmann, male   DOB: 1951/06/06, 61 y.o.   MRN: [id number] 1:1 Nursing Note: The patient is resting in bed with eyes closed. No distress noted. 1:1 maintained for safety. Will continue to monitor.

## 2012-05-15 NOTE — Progress Notes (Signed)
  Eric Waters is a 61 y.o. male [id number] [birth year]  05/07/2012 Principal Problem:  *Major depressive disorder, recurrent Active Problems:  [place]   Mental Status: Alert and oriented mood is better but still has slight anxiety. Denies SI/HI/AVH.    Subjective/Objective: Still concerned with diarrhea that he has had for a year. Can prpobably come off 1:1 tomorrow. Plans to return to pre hospital setting once discharged.    Filed Vitals:   05/15/12 0853  BP: 144/81  Pulse: 56  Temp:   Resp:     Lab Results:   BMET    Component Value Date/Time   NA 138 05/06/2012 2133   K 3.9 05/06/2012 2133   CL 102 05/06/2012 2133   CO2 23 05/06/2012 2133   GLUCOSE 104* 05/06/2012 2133   BUN 16 05/06/2012 2133   CREATININE 1.31 05/06/2012 2133   CALCIUM 9.6 05/06/2012 2133   GFRNONAA 58* 05/06/2012 2133   GFRAA 67* 05/06/2012 2133    Medications:  Scheduled:     . azelastine  2 spray Each Nare BH-qamhs  . cephALEXin  500 mg Oral BH-qamhs  . FLUoxetine  40 mg Oral Daily  . fluticasone  2 spray Each Nare Daily  . haloperidol  2 mg Oral Q2200  . loperamide  8 mg Oral BH-q7a  . loratadine  10 mg Oral Daily  . traZODone  75 mg Oral QHS     PRN Meds acetaminophen, alum & mag hydroxide-simeth, magnesium hydroxide Plan : continue current plan of care .   Daphne Karrer,MICKIE D. 05/15/2012

## 2012-05-15 NOTE — Progress Notes (Signed)
Patient ID: Eric Waters, male   DOB: Jun 13, 1951, 61 y.o.   MRN: [id number] 1:1 Nursing Note: The patient remains resting in bed with eyes closed. No distress noted.1:1 maintained for safety. Will continue to monitor.

## 2012-05-15 NOTE — Progress Notes (Signed)
D-In bed awake conversing with 1:1 sitter. A-No physical complaints. Compliant with medications and no prn's requested. A-1:1 continued for safety and remains safe.

## 2012-05-16 NOTE — Progress Notes (Signed)
Patient ID: Eric Waters, male   DOB: 12-31-1950, 61 y.o.   MRN: [id number] 1:1 Nursing Note: The had been resting in bed with eyes closed. Woke up to use the restroom. Side rail lowered by staff. No assistance needed by staff to get out of bed. No distress noted. 1:1 maintained for safety due to fall risk. Will continue to monitor.

## 2012-05-16 NOTE — Progress Notes (Signed)
Patient ID: Eric Waters, male   DOB: 07/01/1951, 61 y.o.   MRN: [id number] 1:1 Nursing Note: The patient was resting in bed with two side rails up. No distress noted. 1:1 maintained for safety due to fall risk. Will continue to monitor for safety.

## 2012-05-16 NOTE — Progress Notes (Signed)
Patient ID: Eric Waters, male   DOB: 08-03-1951, 61 y.o.   MRN: [id number] 1-1 nursing note: He has been up and to dayroom, interacting with peers and staff. Says that he is anxious about what discharge pain  Will be. He denies thought of harming self. He continues to be a fall risk and will remain on 1-1 for safety.

## 2012-05-16 NOTE — Discharge Planning (Signed)
Met with patient in Aftercare Planning Group.   He complained of not sleeping, complained of increased anxiety associated with not knowing who will be his Power of [address].  Encouraged him to talk to his family about this, as it is not a reason to stay in the hospital.  Case Manager contacted patient's neurologist's nurse and left message Orland Jarred if she would ask neurologist if he will be willing to prescribe patient's Haldol, or if we need to seek a psychiatrist appointment for that.  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  Did utilization review for more days.  [name], LCSW 05/16/2012, 4:35 PM

## 2012-05-16 NOTE — Progress Notes (Signed)
D. Pt. Has been ambulating the hallway with staff at his side.  Denies SI/HI and denies A/V hallucinations.  Reports that he is "good".  Denies pain.  A. 1:1  Continued  for safety.  R.  Pt. Remains safe.

## 2012-05-16 NOTE — Progress Notes (Signed)
Patient ID: Eric Waters, male   DOB: 08-13-1951, 61 y.o.   MRN: [id number] 1- 1 note: He has been up and  about interacting with peers and staff.  Self Inventory: depressed at 10, hopeless at 5, denies SI thoughts. Stated that he had been up  all night with diarrhea. Said that he planned to return to where he lived prior to admission and that he was going to go to another church near where he lives. 1-1 continues for safety.

## 2012-05-16 NOTE — Progress Notes (Signed)
[facility] MD Progress Note  05/16/2012 3:47 PM  Diagnosis:  Axis I: Major Depressive Disorder - Recurrent.   The patient was seen today and reports the following:   ADL's: Intact.  Sleep: The patient reports to once again sleeping very well last night but according to staff he slept only 4.75 hours.  Appetite: The patient reports a good appetite today.   Mild>(1-10) >Severe  Hopelessness (1-10): 0  Depression (1-10): 3  Anxiety (1-10): 10   Suicidal Ideation: The patient adamantly denies any suicidal ideations today.  Plan: No  Intent: No  Means: No   Homicidal Ideation: The patient adamantly denies any homicidal ideations today.  Plan: No  Intent: No.  Means: No   General Appearance/Behavior: The patient remains cooperative today with this provider.  Eye Contact: Good.  Speech: Appropriate in rate and volume with no pressuring noted today.  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  Level of Consciousness: Alert and Oriented x 3.  Mental Status: Alert and Oriented x 3.  Mood: Mild depression reported today.  Affect: Appears bright and full today.  Anxiety Level: Severe anxiety reported today.  The patient states he is worried about his future.  Thought Process: wnl.  Thought Content: The patient denies any auditory or visual hallucinations today as well as any delusions thinking. There continues to be negative thinking related to his POA.  Perception: wnl.  Judgment: Fair.  Insight: Fair.  Cognition: Oriented to person, place and time.  Sleep:  Number of Hours: 4.75    Vital Signs:Blood pressure 121/81, pulse 55, temperature 98.2 F (36.8 C), temperature source Oral, resp. rate 20, height 6' (1.829 m), weight 83.915 kg (185 lb), SpO2 98.00%.  Current Medications: Current Facility-Administered Medications  Medication Dose Route Frequency Provider Last Rate Last Dose  .  acetaminophen (TYLENOL) tablet 650 mg  650 mg Oral Q6H PRN Verne Spurr, PA-C      . alum & mag hydroxide-simeth (MAALOX/MYLANTA) 200-200-20 MG/5ML suspension 30 mL  30 mL Oral Q4H PRN Verne Spurr, PA-C      . azelastine (ASTELIN) nasal spray 2 spray  2 spray Each Nare BH-qamhs Ronny Bacon, MD   2 spray at 05/16/12 0823  . cephALEXin (KEFLEX) capsule 500 mg  500 mg Oral BH-qamhs Curlene Labrum Theophilus Walz, MD   500 mg at 05/16/12 0823  . FLUoxetine (PROZAC) capsule 40 mg  40 mg Oral Daily Curlene Labrum Aishia Barkey, MD   40 mg at 05/16/12 0824  . fluticasone (FLONASE) 50 MCG/ACT nasal spray 2 spray  2 spray Each Nare Daily Curlene Labrum Lorma Heater, MD   2 spray at 05/16/12 0823  . haloperidol (HALDOL) tablet 2 mg  2 mg Oral Q2200 Curlene Labrum Tearia Gibbs, MD   2 mg at 05/15/12 2131  . loperamide (IMODIUM) capsule 8 mg  8 mg Oral BH-q7a Curlene Labrum Virat Prather, MD   8 mg at 05/16/12 4098  . loratadine (CLARITIN) tablet 10 mg  10 mg Oral Daily Curlene Labrum Lavin Petteway, MD   10 mg at 05/16/12 0824  . magnesium hydroxide (MILK OF MAGNESIA) suspension 30 mL  30 mL Oral Daily PRN Verne Spurr, PA-C      . traZODone (DESYREL) tablet 75 mg  75 mg Oral QHS Curlene Labrum Dartanian Knaggs, MD   75 mg at 05/15/12 2131   Lab Results: No results found for this or any previous visit (from the past 48 hour(s)).  Review of Systems:  Neurological: The patient denies any headaches today. He denies any seizures or dizziness.  G.I.: The patient denies any constipation or G.I. Upset today.  He does report diarrhea most likely related to his antibiotic.  Musculoskeletal: The patient denies any muscle or skeletal difficulties with the exception of his significant involuntary movements.  Skin: The patient has bilateral superficial lacerations to his wrists. These are clean and healing well with no infection noted. He also has an abrasion on his forehead which is also healing.   Time was spent today discussing with the patient his current symptoms. The patient reports to  sleeping well last night but according to staff he slept only 4.75 hours. He reports a good appetite and reports mild feelings of sadness, anhedonia or depressed mood today. He does report severe anxiety symptoms today stating that he is worried about his future. He denies any auditory or visual hallucinations or delusional thinking and adamantly denies any suicidal or homicidal ideations. Negative thoughts related to his POA continue with the patient refusing to allow the treatment team to speak with his POA.   Treatment Plan Summary:  1. Daily contact with patient to assess and evaluate symptoms and progress in treatment.  2. Medication management  3. The patient will deny suicidal ideations or homicidal ideations for 48 hours prior to discharge and have a depression and anxiety rating of 3 or less. The patient will also deny any auditory or visual hallucinations or delusional thinking.  4. The patient will deny any symptoms of substance withdrawal at time of discharge.   Plan:  1. Will continue the medication Prozac at 40 mgs po q am for depression.  2. Will continue the medication Haldol at 2 mgs po qhs to help with the patient's involuntary movements related to his Huntington's Disease as well as any paranoid delusions.  3. Will continue the medication Trazodone at 75 mgs po qhs for sleep.  4. Will continue the medication Keflex at 500 mgs po q am and hs x 10 days for cellulitis of wrists. This was started on 05/09/2012.  5. Laboratory studies reviewed.  6. Will continue the patient on a 1 to 1 for a fall risk.  7. Will continue to monitor.   Eric Waters 05/16/2012, 3:47 PM

## 2012-05-17 NOTE — Progress Notes (Signed)
[facility] MD Progress Note  05/17/2012 12:57 PM  Diagnosis:  Axis I: Major Depressive Disorder - Recurrent.   The patient was seen today and reports the following:   ADL's: Intact.  Sleep: The patient reports to once again sleeping very well last night but according to staff he slept only 4.5 hours.  Appetite: The patient reports a good appetite today.   Mild>(1-10) >Severe  Hopelessness (1-10): 0  Depression (1-10): 0  Anxiety (1-10): 10   Suicidal Ideation: The patient adamantly denies any suicidal ideations today.  Plan: No  Intent: No  Means: No   Homicidal Ideation: The patient adamantly denies any homicidal ideations today.  Plan: No  Intent: No.  Means: No   General Appearance/Behavior: The patient remains cooperative today with this provider.  Eye Contact: Good.  Speech: Appropriate in rate and volume with no pressuring noted today.  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  Level of Consciousness: Alert and Oriented x 3.  Mental Status: Alert and Oriented x 3.  Mood: No depression reported today.  Affect: Appears bright and full today.  Anxiety Level: Severe anxiety reported today. The patient states that he remains worried about his future.  Thought Process: wnl.  Thought Content: The patient denies any auditory or visual hallucinations today as well as any delusions thinking. There continues to be negative thinking related to his POA.  Perception: wnl.  Judgment: Fair.  Insight: Fair.  Cognition: Oriented to person, place and time.  Sleep:  Number of Hours: 4.5    Vital Signs:Blood pressure 155/93, pulse 58, temperature 97.5 F (36.4 C), temperature source Oral, resp. rate 18, height 6' (1.829 m), weight 83.915 kg (185 lb), SpO2 98.00%.  Current Medications: Current Facility-Administered Medications  Medication Dose Route Frequency Provider Last Rate Last Dose  .  acetaminophen (TYLENOL) tablet 650 mg  650 mg Oral Q6H PRN Verne Spurr, PA-C      . alum & mag hydroxide-simeth (MAALOX/MYLANTA) 200-200-20 MG/5ML suspension 30 mL  30 mL Oral Q4H PRN Verne Spurr, PA-C      . azelastine (ASTELIN) nasal spray 2 spray  2 spray Each Nare BH-qamhs Ronny Bacon, MD   2 spray at 05/17/12 0814  . cephALEXin (KEFLEX) capsule 500 mg  500 mg Oral BH-qamhs Curlene Labrum Tanji Storrs, MD   500 mg at 05/17/12 0814  . FLUoxetine (PROZAC) capsule 40 mg  40 mg Oral Daily Curlene Labrum Alistair Senft, MD   40 mg at 05/17/12 0815  . fluticasone (FLONASE) 50 MCG/ACT nasal spray 2 spray  2 spray Each Nare Daily Curlene Labrum Ami Mally, MD   2 spray at 05/17/12 0814  . haloperidol (HALDOL) tablet 2 mg  2 mg Oral Q2200 Curlene Labrum Llana Deshazo, MD   2 mg at 05/16/12 2230  . loperamide (IMODIUM) capsule 8 mg  8 mg Oral BH-q7a Curlene Labrum Advit Trethewey, MD   8 mg at 05/17/12 0620  . loratadine (CLARITIN) tablet 10 mg  10 mg Oral Daily Curlene Labrum Brehanna Deveny, MD   10 mg at 05/17/12 0815  . magnesium hydroxide (MILK OF MAGNESIA) suspension 30 mL  30 mL Oral Daily PRN Verne Spurr, PA-C      . traZODone (DESYREL) tablet 75 mg  75 mg Oral QHS Curlene Labrum Caylor Tallarico, MD   75 mg at 05/16/12 2231   Lab Results: No results found for this or any previous visit (from the past 48 hour(s)).  Review of Systems:  Neurological: The patient denies any headaches today. He denies any seizures or dizziness.  G.I.: The patient denies any constipation or G.I. Upset today. He does report ongoing diarrhea which he states has been a problem for over a year.  Musculoskeletal: The patient denies any muscle or skeletal difficulties with the exception of his significant involuntary movements.  Skin: The patient has well healing bilateral superficial lacerations to his wrists. These are clean and healing well with no infection noted.   Time was spent today discussing with the patient his current symptoms. The patient reports to sleeping well again last night but  according to staff he slept only 4.5 hours. He reports a good appetite and denies any significant feelings of sadness, anhedonia or depressed mood today. He does report ongoing severe anxiety symptoms today stating that he is worried about his future. He denies any auditory or visual hallucinations or delusional thinking and adamantly denies any suicidal or homicidal ideations. Negative thoughts related to his POA continue with the patient refusing to allow the treatment team to speak with his POA.  However there is no other evidence of possible delusional thinking.   Treatment Plan Summary:  1. Daily contact with patient to assess and evaluate symptoms and progress in treatment.  2. Medication management  3. The patient will deny suicidal ideations or homicidal ideations for 48 hours prior to discharge and have a depression and anxiety rating of 3 or less. The patient will also deny any auditory or visual hallucinations or delusional thinking.  4. The patient will deny any symptoms of substance withdrawal at time of discharge.   Plan:  1. Will continue the medication Prozac at 40 mgs po q am for depression.  2. Will continue the medication Haldol at 2 mgs po qhs to help with the patient's involuntary movements related to his Huntington's Disease as well as any paranoid delusions.  3. Will continue the medication Trazodone at 75 mgs po qhs for sleep.  4. Will continue the medication Keflex at 500 mgs po q am and hs x 10 days for cellulitis of wrists. This was started on 05/09/2012.  5. Laboratory studies reviewed.  6. Will discontinue the patient's 1 to 1 for a fall risk.  7. Will continue to monitor.  8. A Medicine Consult will be ordered to evaluate the patient's chronic diarrhea.  Tyjae Shvartsman 05/17/2012, 12:57 PM

## 2012-05-17 NOTE — Progress Notes (Signed)
[facility] Group Notes:  (Counselor/Nursing/MHT/Case Management/Adjunct)  05/17/2012 2:47 PM  Type of Therapy:  Group Therapy 05/16/12  Participation Level:  Did Not Attend     Eric Waters 05/17/2012, 2:47 PM

## 2012-05-17 NOTE — Progress Notes (Signed)
[facility] Group Notes:  (Counselor/Nursing/MHT/Case Management/Adjunct)  05/17/2012 2:47 PM  Type of Therapy:  Group Therapy  Participation Level:  Active  Participation Quality:  Attentive and Sharing  Affect:  Blunted  Cognitive:  Oriented  Insight:  Limited  Engagement in Group:  Good  Engagement in Therapy:  Good  Modes of Intervention:  Education, Orientation and Support  Summary of Progress/Problems: Patient talked about dealing with medical problems for over a year. Stated he is doing a little better. Plans to try to stay away from a person who was creating a lot of problems for him.   HartisAram Beecham 05/17/2012, 2:47 PM

## 2012-05-17 NOTE — Discharge Planning (Signed)
Met with patient in Aftercare Planning Group.   His movements are much improved.  He showed insight about the situation over his Power of [place], stating that the problem he is having with the POA is not present with anyone else in his life.  Case Manager spoke with RN at his neurologist's office, and she confirmed that Dr. Dan Humphreys is out of town, but is likely to be okay with prescribing the Haldol for patient's movements.  She set a follow-up appointment for 07/12/12 at 1:00pm.  [name], LCSW 05/17/2012, 5:33 PM

## 2012-05-17 NOTE — Progress Notes (Signed)
Pt reports he has had several bouts of diarrhea today, and at this time is in the bathroom for loose stool.  He takes Imodium 8mg  daily scheduled in the AM.  He has already had his sleep medication.  Informed pt of this.  He voices understanding.  Pt continues on a 1:1 observation for high fall risk.  He is cooperative with staff and understands the purpose of the 1:1.  He denies SI/HI/AV.  He voices no other complaints.  Sitter with pt.  Pt remains safe at this time.

## 2012-05-18 DIAGNOSIS — J309 Allergic rhinitis, unspecified: Secondary | ICD-10-CM

## 2012-05-18 DIAGNOSIS — G10 Huntington's disease: Secondary | ICD-10-CM

## 2012-05-18 NOTE — Discharge Planning (Signed)
Met with patient in his room, and asked whether he had contacted his roommate about picking him up.  He stated that he has been with his roommate 3 years, and that it is better to just call for a ride from the roommate when he is ready.  [name], LCSW 05/18/2012, 9:56 AM

## 2012-05-18 NOTE — Tx Team (Signed)
Interdisciplinary Treatment Plan Update (Adult)  Date:  05/18/2012  Time Reviewed:  10:15AM-11:15AM  Progress in Treatment: Attending groups:  Yes, most of the time Participating in groups:    Yes Taking medication as prescribed:    Yes, no refusals Tolerating medication:   Yes, no side effects have been noted by staff or reported by patient Family/Significant other contact made:  Yes Patient understands diagnosis:   Yes, fair insight, poor judgment Discussing patient identified problems/goals with staff:   Yes Medical problems stabilized or resolved:   Huntington's is ongoing, but patient's movement disorder is more controlled now Denies suicidal/homicidal ideation:  Yes Issues/concerns per patient self-inventory:   None Other:    New problem(s) identified: Yes, Describe:  anxiety re future, especially as relates to Power of Attorney  Reason for Continuation of Hospitalization: Medical Issues Medication stabilization  Interventions implemented related to continuation of hospitalization:  Medication monitoring and adjustment, safety checks Q15 min., suicide risk assessment, group therapy, psychoeducation, collateral contact, aftercare planning, ongoing physician assessments, medication education  Additional comments:  Not applicable  Estimated length of stay:  1 day  Discharge Plan:  Return to his home, follow up with his neurologist  New goal(s):  #5 Reduce anxiety to no greater than 3 at discharge. (10 today)  Review of initial/current patient goals per problem list:   1.  Goal(s):  Medication stabilization  Met:  No  Target date:  By Discharge   As evidenced by:  Still open to changes if needed  2.  Goal(s):  Decrease depression to no greater than 3 at discharge.  Met:  No  Target date:  By Discharge   As evidenced by:  "5" today  3.  Goal(s):  Work on plans to reduce isolation at discharge.  Met:  Yes  Target date:  By Discharge   As evidenced by:  Has  expressed that this is accomplished  4.  Goal(s):  Deny SI for 48 hours prior to D/C.  Met:  Yes  Target date:  By Discharge   As evidenced by:  Denies  Attendees: Patient:  Eric Waters  05/18/2012 10:15AM-11:15AM  Family:     Physician:  Dr. Harvie Heck Readling 05/18/2012 10:15AM-11:15AM  Nursing:   Roswell Miners, RN 05/18/2012 10:15AM -11:15AM   Case Manager:  [name], LCSW 05/18/2012 10:15AM-11:15AM  Counselor:  Veto Kemps, MT-BC 05/18/2012 10:15AM-11:15AM  Other:      Other:      Other:      Other:       Scribe for Treatment Team:   Sarina Ser, 05/18/2012, 10:15AM-11:15AM

## 2012-05-18 NOTE — Progress Notes (Signed)
05/18/2012         Time: 0930      Group Topic/Focus: The focus of this group is on enhancing the patient's understanding of leisure, barriers to leisure, and the importance of engaging in positive leisure activities upon discharge for improved total health.   Participation Level: Active  Participation Quality: Attentive and Appropriate  Affect: Appropriate  Cognitive: Oriented and Alert  Additional Comments: Patient bright, well engaged, able to identify positive leisure interests. Patient reports one of his favorite things to do is cook for other people.   Eric Waters 05/18/2012 11:54 AM

## 2012-05-18 NOTE — Progress Notes (Signed)
Patient ID: Eric Waters, male   DOB: [birth year], 61 y.o.   MRN: [id number] He has been up and about and to groups. Interacting with peers and staff. Has c/o throat being sore. Dr.    Is aware. Internal medicine to come and see him. He continues to say that he is anxious.  He has been ambulating better today, Cuts (abrasions)  On both wrists has no s/s of infection.

## 2012-05-18 NOTE — Progress Notes (Signed)
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   The patient was seen today and reports the following:   ADL's: Intact.  Sleep: The patient reports to having difficulty sleeping last night due to diarrhea. Appetite: The patient reports a good appetite today.   Mild>(1-10) >Severe  Hopelessness (1-10): 0  Depression (1-10): 5  Anxiety (1-10): 10   Suicidal Ideation: The patient adamantly denies any suicidal ideations today.  Plan: No  Intent: No  Means: No   Homicidal Ideation: The patient adamantly denies any homicidal ideations today.  Plan: No  Intent: No.  Means: No   General Appearance/Behavior: The patient remains cooperative today with this provider but with multiple somatic complaints today.  Eye Contact: Good.  Speech: Appropriate in rate and volume with no pressuring noted today.  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  Level of Consciousness: Alert and Oriented x 3.  Mental Status: Alert and Oriented x 3.  Mood: Moderate depression reported today.  Affect: Remains bright and full today.  Anxiety Level: Severe anxiety reported today. The patient states that he remains worried about his future.  Thought Process: wnl.  Thought Content: The patient denies any auditory or visual hallucinations today as well as any delusions thinking. There continues to be negative thinking related to his POA.  Perception: wnl.  Judgment: Fair.  Insight: Fair.  Cognition: Oriented to person, place and time.  Sleep:  Number of Hours: 4.25    Vital Signs:Blood pressure 138/81, pulse 51, temperature 97.9 F (36.6 C), temperature source Oral, resp. rate 16, height 6' (1.829 m), weight 83.915 kg (185 lb), SpO2 98.00%.  Current Medications: Current Facility-Administered Medications  Medication Dose Route Frequency Provider  Last Rate Last Dose  . acetaminophen (TYLENOL) tablet 650 mg  650 mg Oral Q6H PRN Verne Spurr, PA-C      . alum & mag hydroxide-simeth (MAALOX/MYLANTA) 200-200-20 MG/5ML suspension 30 mL  30 mL Oral Q4H PRN Verne Spurr, PA-C      . azelastine (ASTELIN) nasal spray 2 spray  2 spray Each Nare BH-qamhs Ronny Bacon, MD   2 spray at 05/18/12 0817  . cephALEXin (KEFLEX) capsule 500 mg  500 mg Oral BH-qamhs Curlene Labrum Lucilia Yanni, MD   500 mg at 05/18/12 0818  . FLUoxetine (PROZAC) capsule 40 mg  40 mg Oral Daily Curlene Labrum Jaide Hillenburg, MD   40 mg at 05/18/12 0818  . fluticasone (FLONASE) 50 MCG/ACT nasal spray 2 spray  2 spray Each Nare Daily Curlene Labrum Foy Vanduyne, MD   2 spray at 05/18/12 0817  . haloperidol (HALDOL) tablet 2 mg  2 mg Oral Q2200 Curlene Labrum Judith Demps, MD   2 mg at 05/17/12 2137  . loperamide (IMODIUM) capsule 8 mg  8 mg Oral BH-q7a Curlene Labrum Kaitlyn Franko, MD   8 mg at 05/18/12 0631  . loratadine (CLARITIN) tablet 10 mg  10 mg Oral Daily Curlene Labrum Mishawn Hemann, MD   10 mg at 05/18/12 0818  . magnesium hydroxide (MILK OF MAGNESIA) suspension 30 mL  30 mL Oral Daily PRN Verne Spurr, PA-C      . traZODone (DESYREL) tablet 75 mg  75 mg Oral QHS Ronny Bacon, MD   75 mg at 05/17/12 2137   Lab Results: No results found for this or any previous visit (from the past 48 hour(s)).  Review of Systems:  Neurological:  The patient denies any headaches today. He denies any seizures or dizziness.  G.I.: The patient denies any constipation or G.I. Upset today. He does report ongoing diarrhea which he states has been a problem for over a year.  Musculoskeletal: The patient denies any muscle or skeletal difficulties with the exception of his significant involuntary movements.  Skin: The patient has well healing bilateral superficial lacerations to his wrists. These are clean and healing well with no infection noted.  HEENT:  The patient reports severe throat pain today as well as concerns that he has an ear  infection.  Time was spent today discussing with the patient his current symptoms. The patient reports to having difficulty sleeping last night due to diarrhea and "throat pain.". He reports a good appetite and reports moderate feelings of sadness, anhedonia or depressed mood today. He report ongoing severe anxiety symptoms today stating that he is worried about his future. He denies any auditory or visual hallucinations or delusional thinking and adamantly denies any suicidal or homicidal ideations. Negative thoughts related to his POA continue with the patient refusing to allow the treatment team to speak with his POA. However there is no other evidence of possible delusional thinking.   The patient states that he feels he has an "infection all over his body" which needs to be evaluated prior to discharge.  Treatment Plan Summary:  1. Daily contact with patient to assess and evaluate symptoms and progress in treatment.  2. Medication management  3. The patient will deny suicidal ideations or homicidal ideations for 48 hours prior to discharge and have a depression and anxiety rating of 3 or less. The patient will also deny any auditory or visual hallucinations or delusional thinking.  4. The patient will deny any symptoms of substance withdrawal at time of discharge.   Plan:  1. Will continue the medication Prozac at 40 mgs po q am for depression.  2. Will continue the medication Haldol at 2 mgs po qhs to help with the patient's involuntary movements related to his Huntington's Disease as well as any paranoid delusions.  3. Will continue the medication Trazodone at 75 mgs po qhs for sleep.  4. Will continue the medication Keflex at 500 mgs po q am and hs x 10 days for cellulitis of wrists. This was started on 05/09/2012.  5. Laboratory studies reviewed.  6. Will continue to monitor.  7. A Medicine Consult was ordered to evaluate the patient's chronic diarrhea and complaint of throat  pain.  Ko Bardon 05/18/2012, 2:42 PM

## 2012-05-18 NOTE — Consult Note (Addendum)
Requesting physician: Franchot Gallo  Primary Care Physician: Rene Paci, MD  Reason for consultation: Rhinorrhea, swollen lymph nodes   History of Present Illness: Patient is a 61 y/o with history of Depression, huntingtons chorea, that was [facility].  Reportedly patient has been complaining of rhinorrhea and "swollen glands" in his neck.  He mentions that he has had a history of seasonal allergies in the past.  He also denies any fever, chills, nausea, hemoptysis, sore throat or cough.  Nothing he can think of makes it better and he reports continued symptoms year round.  Denies any sick contacts.  Allergies:   Allergies  Allergen Reactions  . Codeine Hives and Rash      Past Medical History  Diagnosis Date  . Allergy   . GERD (gastroesophageal reflux disease)   . Huntington chorea     previously followed at [facility]- Dr Boris Sharper [phone] [name])  . Chronic diarrhea     Past Surgical History  Procedure Date  . Tonsillectomy 1959    Scheduled Meds:   . azelastine  2 spray Each Nare BH-qamhs  . cephALEXin  500 mg Oral BH-qamhs  . FLUoxetine  40 mg Oral Daily  . fluticasone  2 spray Each Nare Daily  . haloperidol  2 mg Oral Q2200  . loperamide  8 mg Oral BH-q7a  . loratadine  10 mg Oral Daily  . traZODone  75 mg Oral QHS   Continuous Infusions:  PRN Meds:.acetaminophen, alum & mag hydroxide-simeth, magnesium hydroxide  Social History:  reports that he has never smoked. He has never used smokeless tobacco. He reports that he does not drink alcohol or use illicit drugs.  Family History  Problem Relation Age of Onset  . Cancer Father     Lung  . Alzheimer's disease Maternal Uncle   . Stroke Maternal Uncle     Alzheimer  . Alzheimer's disease Maternal Grandfather   . Huntington's disease Maternal Grandmother   . Huntington's disease Mother     Review of Systems:  Constitutional: Denies fever, chills,  diaphoresis, appetite change and fatigue.  HEENT: Denies photophobia, eye pain, redness, hearing loss, + ear discomfort, congestion, sore throat, + rhinorrhea (clear), + sneezing, denies mouth sores, trouble swallowing, neck pain, neck stiffness and tinnitus.   Respiratory: Denies SOB, DOE, cough, chest tightness,  and wheezing.   Cardiovascular: Denies chest pain, palpitations and leg swelling.  Gastrointestinal: Denies nausea, vomiting, abdominal pain, + diarrhea, denies constipation, blood in stool and abdominal distention.  Genitourinary: Denies dysuria, urgency, frequency, hematuria, flank pain and difficulty urinating.  Musculoskeletal: Denies myalgias, back pain, joint swelling, arthralgias and gait problem.  Skin: Denies pallor, rash and wound.  Neurological: Denies dizziness, seizures, syncope, weakness, light-headedness, numbness and headaches.  Hematological: Denies adenopathy. Easy bruising, personal or family bleeding history  Psychiatric/Behavioral: Denies suicidal ideation, mood changes, confusion, nervousness, sleep disturbance and agitation   Physical Exam: Blood pressure 138/81, pulse 51, temperature 97.9 F (36.6 C), temperature source Oral, resp. rate 16, height 6' (1.829 m), weight 83.915 kg (185 lb), SpO2 98.00%. General: Alert, awake, oriented x3, in no acute distress. HEENT: + errythematous nasal mucous membranes no purulent discharge, submandibular lymph nodes were nontender to palpation, No bruits, no goiter. Heart: Regular rate and rhythm, without murmurs, rubs, gallops. Lungs: Clear to auscultation bilaterally. Abdomen: Soft, nontender, nondistended, positive bowel sounds. Extremities: No clubbing cyanosis or edema with positive pedal pulses. Neuro: Grossly intact, nonfocal. With chorea    Labs  on Admission:  No results found for this or any previous visit (from the past 48 hour(s)).  Radiological Exams on Admission: No results  found.  Assessment/Plan Principal Problem:  *Major depressive disorder, recurrent Active Problems:  HUNTINGTONS CHOREA - Per psychiatry  Rhinorrhea  - At this juncture felt to be secondary to seasonal allergies based on history.  Submandibular lymph nodes were not enlarged or painful to palpation, patient has WBC of 5.7 on 6/14 and has been afebrile.  Thus feel like infectious etiology is much less likely as a cause.   At this time would plan on continuing flonase and claritin and have provided reassurance.  Will order cbc to evaluate for elevated WBC count.  Time Spent on Consultation: 35 minutes examining patient, placing orders, documenting, billing, updating information systems.  Penny Pia [facility]  [phone] 05/18/2012, 8:51 PM

## 2012-05-18 NOTE — Progress Notes (Signed)
Pt is awake, sitting up in bed.  He is frustrated that he continues to have diarrhea which is keeping him up tonight.  He had the same issue last night also.  He reports that the MD said they were going to set up some testing and hopefully get him on an antibiotic because it is possible that an infection is causing the diarrhea.  He is on scheduled imodium.  Spent time letting pt vent his frustration.  Pt denies thoughts of self harm.  He voices no other needs/concerns.  Safety maintained with q15 minute checks.

## 2012-05-18 NOTE — Progress Notes (Signed)
[facility] Group Notes:  (Counselor/Nursing/MHT/Case Management/Adjunct)  05/18/2012 1:43 PM  Type of Therapy:  Group Therapy  11:15, Psycho-education 1:15  Participation Level:  Active  Participation Quality:  Attentive and Sharing  Affect:  Blunted  Cognitive:  Oriented  Insight:  Limited  Engagement in Group:  Good  Engagement in Therapy:  Good  Modes of Intervention:  Clarification, Education and Problem-solving, support    Summary of Progress/Problems:  Patient was focused on somatic complaints of swollen throat and glands. He seemed reassured when counselor told him that a doctor from internal medicine would be visiting him. Talked some about his illness. Patient was attentive to speaker from mental health association, He asked some good questions concerning diagnosis and services.   HartisAram Waters 05/18/2012, 1:43 PM

## 2012-05-19 DIAGNOSIS — J309 Allergic rhinitis, unspecified: Secondary | ICD-10-CM

## 2012-05-19 DIAGNOSIS — R197 Diarrhea, unspecified: Secondary | ICD-10-CM

## 2012-05-19 DIAGNOSIS — G10 Huntington's disease: Secondary | ICD-10-CM

## 2012-05-19 DIAGNOSIS — R45851 Suicidal ideations: Secondary | ICD-10-CM

## 2012-05-19 LAB — CBC
HCT: 36.9 % — ABNORMAL LOW (ref 39.0–52.0)
Hemoglobin: 11.9 g/dL — ABNORMAL LOW (ref 13.0–17.0)
MCH: 28.8 pg (ref 26.0–34.0)
MCHC: 32.2 g/dL (ref 30.0–36.0)
MCV: 89.3 fL (ref 78.0–100.0)
RDW: 13.4 % (ref 11.5–15.5)

## 2012-05-19 MED ORDER — SACCHAROMYCES BOULARDII 250 MG PO CAPS
250.0000 mg | ORAL_CAPSULE | Freq: Two times a day (BID) | ORAL | Status: DC
  Administered 2012-05-20 – 2012-05-23 (×7): 250 mg via ORAL
  Filled 2012-05-19: qty 10
  Filled 2012-05-19 (×6): qty 1
  Filled 2012-05-19: qty 10
  Filled 2012-05-19 (×3): qty 1

## 2012-05-19 MED ORDER — PSYLLIUM 95 % PO PACK
1.0000 | PACK | Freq: Every day | ORAL | Status: DC
  Administered 2012-05-20 – 2012-05-23 (×4): 1 via ORAL
  Filled 2012-05-19 (×6): qty 1
  Filled 2012-05-19: qty 14

## 2012-05-19 NOTE — Progress Notes (Signed)
D   Pt has been pleasant and cooperative  He has been somewhat somatic and complained of constant diarrhea   A medical doctor came in to see pt this evening and wrote some new orders   He attended karoke group   His thinking is logical and coherent   He denies auditory and visual hallucinations and denies suicidal and homicidal ideation   He continues to have the jerky movements of hunntingtons chorea but it appears less than previously noted and a little more in control of his movements  He reported good sleep and appetite back to normal A   Verbal support given  Medications administered and effectiveness monitored  Provided pt hat in toilet for specimen collection   Allow pt extra time for ADLs and other tasks  Discussed reasons for specimen collection due to pt complaints of diarrhea   Q 15 min checks R   Pt provided specimen but it was not diarrhea  And looked more like constipatiion  Pt was not able to remark on that fact but did say the doctor would learn a lot from that specimen   Pt is safe at present and resting in bed

## 2012-05-19 NOTE — Discharge Planning (Signed)
Met with patient in the phone room for some time to discuss potential discharge plans.  Insurance is denying payment for further days, and expect that Case Manager will make patient aware of their decision, so this was relayed to him.  Case Manager assured him that discharge decisions are not based on payment, but that we also want to make good clinical decisions in combination with knowledge that hospital bills when one gets home can produce stress.  Patient spoke at length about his mother who has Huntington's, as well as his sister's change in attitude based on his stepfather's insistence that his mother be treated with loving and kindness.  He kept equating his mother's excellent handling of her Huntington's at the age of 6 with the positive environment she is in 7 days a week, and seemed wishful that he had such supports himself.    Case Manager did inform him that psychiatrically the Treatment Team feels he is probably nearing discharge.  Explained to him about the length of time to his neurology appointment, and that we will talk to the doctor about giving him a refill on his prescription to cover that period of time.  Patient continued to insist that he has an infection and without that infection, he would be ready to go home, would be able to get well, even with his Huntington's.  Case Manager called patient's daughter Beatris Ship [phone] and informed her that patient is nearing discharge, and that we are trying to work out plans.  She stated that patient's roommate [name] is incorrect about stating that she and her husband will be at a church conference with him this weekend, and that his perception is sometimes questionable.  She said again that the only person with reliable insight into the patient's status and his care at home is his Power of Attorney/cousin's husband Oakley.    Daughter wanted to talk things over with her husband, and they will call back.  [name],  LCSW 05/19/2012, 5:04 PM

## 2012-05-19 NOTE — Progress Notes (Signed)
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   The patient was seen today and reports the following:   ADL's: Intact.  Sleep: The patient reports to once again having difficulty sleeping last night due to diarrhea.  Appetite: The patient reports a good appetite today.   Mild>(1-10) >Severe  Hopelessness (1-10): 3-4  Depression (1-10): 4  Anxiety (1-10): 10   Suicidal Ideation: The patient adamantly denies any suicidal ideations today.  Plan: No  Intent: No  Means: No   Homicidal Ideation: The patient adamantly denies any homicidal ideations today.  Plan: No  Intent: No.  Means: No   General Appearance/Behavior: The patient remains cooperative today with this provider and is complaining of diarrhea only today.  Eye Contact: Good.  Speech: Appropriate in rate and volume with no pressuring noted today.  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  Level of Consciousness: Alert and Oriented x 3.  Mental Status: Alert and Oriented x 3.  Mood: Mild to moderate depression reported today.  Affect: Remains bright and full today.  Anxiety Level: Severe anxiety reported today. The patient states that he remains worried about his future and what to do with his POA.  Thought Process: wnl.  Thought Content: The patient denies any auditory or visual hallucinations today as well as any delusions thinking. There continues to be negative thinking related to his POA.  Perception: wnl.  Judgment: Fair.  Insight: Fair.  Cognition: Oriented to person, place and time.  Sleep:  Number of Hours: 4.75    Vital Signs:Blood pressure 136/81, pulse 60, temperature 96.9 F (36.1 C), temperature source Oral, resp. rate 15, height 6' (1.829 m), weight 83.915 kg (185 lb), SpO2 98.00%.  Current Medications: Current Facility-Administered  Medications  Medication Dose Route Frequency Provider Last Rate Last Dose  . acetaminophen (TYLENOL) tablet 650 mg  650 mg Oral Q6H PRN Verne Spurr, PA-C      . alum & mag hydroxide-simeth (MAALOX/MYLANTA) 200-200-20 MG/5ML suspension 30 mL  30 mL Oral Q4H PRN Verne Spurr, PA-C      . azelastine (ASTELIN) nasal spray 2 spray  2 spray Each Nare BH-qamhs Curlene Labrum Jaki Steptoe, MD   2 spray at 05/19/12 0800  . cephALEXin (KEFLEX) capsule 500 mg  500 mg Oral BH-qamhs Curlene Labrum Alexsa Flaum, MD   500 mg at 05/18/12 2202  . FLUoxetine (PROZAC) capsule 40 mg  40 mg Oral Daily Curlene Labrum Keno Caraway, MD   40 mg at 05/19/12 0759  . fluticasone (FLONASE) 50 MCG/ACT nasal spray 2 spray  2 spray Each Nare Daily Curlene Labrum Keoshia Steinmetz, MD   2 spray at 05/19/12 0801  . haloperidol (HALDOL) tablet 2 mg  2 mg Oral Q2200 Curlene Labrum Rosezetta Balderston, MD   2 mg at 05/18/12 2202  . loperamide (IMODIUM) capsule 8 mg  8 mg Oral BH-q7a Curlene Labrum Saiquan Hands, MD   8 mg at 05/19/12 0612  . loratadine (CLARITIN) tablet 10 mg  10 mg Oral Daily Curlene Labrum Keshon Markovitz, MD   10 mg at 05/19/12 0800  . magnesium hydroxide (MILK OF MAGNESIA) suspension 30 mL  30 mL Oral Daily PRN Verne Spurr, PA-C      . traZODone (DESYREL) tablet 75 mg  75 mg Oral QHS Ronny Bacon, MD   75 mg at 05/18/12 2202   Lab Results:  Results for orders placed during the  hospital encounter of 05/07/12 (from the past 48 hour(s))  CBC     Status: Abnormal   Collection Time   05/19/12  6:15 AM      Component Value Range Comment   WBC 5.5  4.0 - 10.5 K/uL    RBC 4.13 (*) 4.22 - 5.81 MIL/uL    Hemoglobin 11.9 (*) 13.0 - 17.0 g/dL    HCT 16.1 (*) 09.6 - 52.0 %    MCV 89.3  78.0 - 100.0 fL    MCH 28.8  26.0 - 34.0 pg    MCHC 32.2  30.0 - 36.0 g/dL    RDW 04.5  40.9 - 81.1 %    Platelets 268  150 - 400 K/uL    Review of Systems:  Neurological: The patient denies any headaches today. He denies any seizures or dizziness.  G.I.: The patient denies any constipation or G.I. Upset today. He  does report ongoing diarrhea which he states has been a problem for over a year.  Musculoskeletal: The patient denies any muscle or skeletal difficulties with the exception of his significant involuntary movements.  Skin: The patient has well healing bilateral superficial lacerations to his wrists. These are clean and healing well with no infection noted.    Time was spent today discussing with the patient his current symptoms. The patient reports to having difficulty sleeping last night due to diarrhea.  He denies any "throat pain" today.  He reports a good appetite and reports mild to moderate feelings of sadness, anhedonia and depressed mood today. However, the patient's affect is bright and full.  He report ongoing severe anxiety symptoms today stating that he is worried about his future and what to do with his POA.  He however does not appear anxiety.  He denies any auditory or visual hallucinations or delusional thinking and adamantly denies any suicidal or homicidal ideations.   It was discussed with the patient the need to consider discharge tomorrow and for his medical issues to be followed up as an outpatient.  It was also discussed the need to his roommate "[name]" to visit in order for further collateral information.   Treatment Plan Summary:  1. Daily contact with patient to assess and evaluate symptoms and progress in treatment.  2. Medication management  3. The patient will deny suicidal ideations or homicidal ideations for 48 hours prior to discharge and have a depression and anxiety rating of 3 or less. The patient will also deny any auditory or visual hallucinations or delusional thinking.  4. The patient will deny any symptoms of substance withdrawal at time of discharge.   Plan:  1. Will continue the medication Prozac at 40 mgs po q am for depression.  2. Will continue the medication Haldol at 2 mgs po qhs to help with the patient's involuntary movements related to his Huntington's  Disease as well as any paranoid delusions.  3. Will continue the medication Trazodone at 75 mgs po qhs for sleep.  4. Will continue the medication Keflex at 500 mgs po q am and hs x 10 days for cellulitis of wrists. This was started on 05/09/2012.  5. Laboratory studies reviewed.  6. Will continue to monitor.  7. A Medicine Consult was ordered and completed to evaluate the patient's chronic diarrhea and complaint of throat pain.  The patient's medical issues will be followed by the Hospitalist.  Lucianne Smestad 05/19/2012, 10:08 AM

## 2012-05-19 NOTE — Progress Notes (Signed)
[facility] Group Notes:  (Counselor/Nursing/MHT/Case Management/Adjunct)  05/19/2012 3:32 PM  Type of Therapy:  Group Therapy  9:30, Music Therapy 1:15  Participation Level:  Active  Participation Quality:  Attentive and Sharing  Affect:  Blunted  Cognitive:  Oriented  Insight:  Limited  Engagement in Group:  Good  Engagement in Therapy:  Good  Modes of Intervention:  Clarification, Education, Problem-solving and Support  Summary of Progress/Problems: Patient was pleased with yesterday's doctor's visit about his throat and stated they were coming back today. He stated that he was going to give him medicine to get rid of all his medical problems. Patient stated that when he goes home, he wants to start cooking more and invite others over to his home like he used to do.   HartisAram Beecham 05/19/2012, 3:32 PM

## 2012-05-19 NOTE — Progress Notes (Signed)
Pt observed in the hallway.  He reports his day has been ok.  He says he is glad he was able to see the internalist.  Pt was ordered a CBC  in the AM.  He denies SI/HI/AV at this time.  His discharge plans are still incomplete.  He has not c/o throat pain this evening to this Clinical research associate.  He voices no needs/concerns, although he says he is still experiencing frequent loose stools.   Safety maintained with q15 minute checks.

## 2012-05-19 NOTE — Progress Notes (Signed)
Subjective: Patient denies any abdominal pain.  States that he continue to have frequent bowel movements.  No fever, chills, nausea, emesis reported.  No acute issues overnight.  Objective: Filed Vitals:   05/17/12 0848 05/18/12 0901 05/19/12 0600 05/19/12 0601  BP: 155/93 138/81 119/83 136/81  Pulse: 58 51 58 60  Temp:  97.9 F (36.6 C) 96.9 F (36.1 C)   TempSrc:      Resp:  16 15   Height:      Weight:      SpO2:       Weight change:  No intake or output data in the 24 hours ending 05/19/12 1902  General: Alert, awake, oriented x3, in no acute distress.  HEENT: No tenderness on palpation over cervical lymph nodes, No bruits, no goiter.  Heart: Regular rate and rhythm, without murmurs, rubs, gallops.  Lungs: clear to auscultation, bilateral air movement.  Abdomen: Soft, nontender, nondistended, positive bowel sounds.  Neuro: Grossly intact, nonfocal.   Lab Results: No results found for this basename: NA:2,K:2,CL:2,CO2:2,GLUCOSE:2,BUN:2,CREATININE:2,CALCIUM:2,MG:2,PHOS:2 in the last 72 hours No results found for this basename: AST:2,ALT:2,ALKPHOS:2,BILITOT:2,PROT:2,ALBUMIN:2 in the last 72 hours No results found for this basename: LIPASE:2,AMYLASE:2 in the last 72 hours  Basename 05/19/12 0615  WBC 5.5  NEUTROABS --  HGB 11.9*  HCT 36.9*  MCV 89.3  PLT 268   No results found for this basename: CKTOTAL:3,CKMB:3,CKMBINDEX:3,TROPONINI:3 in the last 72 hours No components found with this basename: POCBNP:3 No results found for this basename: DDIMER:2 in the last 72 hours No results found for this basename: HGBA1C:2 in the last 72 hours No results found for this basename: CHOL:2,HDL:2,LDLCALC:2,TRIG:2,CHOLHDL:2,LDLDIRECT:2 in the last 72 hours No results found for this basename: TSH,T4TOTAL,FREET3,T3FREE,THYROIDAB in the last 72 hours No results found for this basename: VITAMINB12:2,FOLATE:2,FERRITIN:2,TIBC:2,IRON:2,RETICCTPCT:2 in the last 72 hours  Micro Results: No  results found for this or any previous visit (from the past 240 hour(s)).  Studies/Results: No results found.  Medications: I have reviewed the patient's current medications.   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  At this point would recommend ruling out C diff although patient does not have recent history of antibiotic usage prior to admission.  Will discontinue loperamide and add fiber supplement and florastor while I am testing for C difficil.  Should results come back negative would start loperamide again. -Patient does not have active signs of infection (elevated WBC), fever, or abdominal discomfort.  As such at this point will hold off on antibiotic coverage for c diff.  ALLERGIC RHINITIS (12/06/2009) - Continue current regimen         LOS: 12 days   Penny Pia M.D.  [facility] 05/19/2012, 7:02 PM

## 2012-05-19 NOTE — Progress Notes (Signed)
Patient ID: Eric Waters, male   DOB: [birth year], 61 y.o.   MRN: [id number] Called patient's roommate [name] to notify him of projected discharge as early as tomorrow. He stated that he would come up for visiting hours and see patient. He did not express any concerns but he did say he would be out of town (starting on Friday morning, and getting back late on Sunday).

## 2012-05-19 NOTE — Progress Notes (Signed)
  D) Patient pleasant and cooperative upon my assessment. Patient states slept "poor," and  appetite is "good ." Patient rates depression as  1 /10, patient rates hopeless feelings as 1 /10. Patient denies SI/HI, denies A/V hallucinations. Patient states " I am having a good day today."   A) Patient offered support and encouragement, patient encouraged to discuss feelings/concerns with staff. Patient verbalized understanding. Patient monitored Q15 minutes for safety. Patient met with MD and treatment team to discuss today's goals and plan of care.  R) Patient active on unit, attending groups in day room and meals in dining room.  Patient taking medications as ordered. Will continue to monitor.

## 2012-05-20 DIAGNOSIS — J309 Allergic rhinitis, unspecified: Secondary | ICD-10-CM

## 2012-05-20 DIAGNOSIS — R197 Diarrhea, unspecified: Secondary | ICD-10-CM

## 2012-05-20 DIAGNOSIS — G10 Huntington's disease: Secondary | ICD-10-CM

## 2012-05-20 DIAGNOSIS — R45851 Suicidal ideations: Secondary | ICD-10-CM

## 2012-05-20 NOTE — Progress Notes (Signed)
[facility] MD Progress Note  05/20/2012 3:48 PM  Diagnosis:  Axis I: Major Depressive Disorder - Recurrent.   The patient was seen today and reports the following:   ADL's: Intact.  Sleep: The patient reports to having some ongoing difficulty initiating and maintaining sleep last night.  Appetite: The patient reports a good appetite today.   Mild>(1-10) >Severe  Hopelessness (1-10): 1-2  Depression (1-10): 0  Anxiety (1-10): 10   Suicidal Ideation: The patient adamantly denies any suicidal ideations today.  Plan: No  Intent: No  Means: No   Homicidal Ideation: The patient adamantly denies any homicidal ideations today.  Plan: No  Intent: No.  Means: No   General Appearance/Behavior: The patient remains cooperative today with this provider and is complaining of ongoing diarrhea. The patient however appears to be having hard stools with no diarrhea reported. Eye Contact: Good.  Speech: Appropriate in rate and volume with no pressuring noted today.  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  Level of Consciousness: Alert and Oriented x 3.  Mental Status: Alert and Oriented x 3.  Mood: No depression reported today.  Affect: Remains bright and full today.  Anxiety Level: Severe anxiety reported today. The patient states that he remains worried about his future and what to do with his POA.  Thought Process: wnl.  Thought Content: The patient denies any auditory or visual hallucinations today as well as any delusions thinking. There continues to be negative thinking related to his POA.  Perception: wnl.  Judgment: Fair.  Insight: Fair.  Cognition: Oriented to person, place and time.  Sleep:  Number of Hours: 5.75    Vital Signs:Blood pressure 127/87, pulse 60, temperature 97.3 F (36.3 C), temperature source Oral, resp. rate 16, height 6' (1.829 m), weight 83.915 kg (185 lb), SpO2  98.00%.  Current Medications: Current Facility-Administered Medications  Medication Dose Route Frequency Provider Last Rate Last Dose  . acetaminophen (TYLENOL) tablet 650 mg  650 mg Oral Q6H PRN Verne Spurr, PA-C      . alum & mag hydroxide-simeth (MAALOX/MYLANTA) 200-200-20 MG/5ML suspension 30 mL  30 mL Oral Q4H PRN Verne Spurr, PA-C      . azelastine (ASTELIN) nasal spray 2 spray  2 spray Each Nare BH-qamhs Ronny Bacon, MD   2 spray at 05/20/12 0758  . FLUoxetine (PROZAC) capsule 40 mg  40 mg Oral Daily Curlene Labrum Zasha Belleau, MD   40 mg at 05/20/12 0758  . fluticasone (FLONASE) 50 MCG/ACT nasal spray 2 spray  2 spray Each Nare Daily Curlene Labrum Destiny Hagin, MD   2 spray at 05/20/12 0758  . haloperidol (HALDOL) tablet 2 mg  2 mg Oral Q2200 Curlene Labrum Alverna Fawley, MD   2 mg at 05/19/12 2235  . loratadine (CLARITIN) tablet 10 mg  10 mg Oral Daily Curlene Labrum Haytham Maher, MD   10 mg at 05/20/12 0759  . psyllium (HYDROCIL/METAMUCIL) packet 1 packet  1 packet Oral Daily Penny Pia, MD   1 packet at 05/20/12 [phone]  . saccharomyces boulardii (FLORASTOR) capsule 250 mg  250 mg Oral BID Penny Pia, MD   250 mg at 05/20/12 0811  . traZODone (DESYREL) tablet 75 mg  75 mg Oral QHS Curlene Labrum Deshawnda Acrey, MD   75 mg at 05/19/12 2234  . DISCONTD: loperamide (IMODIUM) capsule 8 mg  8 mg Oral BH-q7a Curlene Labrum Brynnleigh Mcelwee, MD   8 mg at 05/19/12 0612  .  DISCONTD: magnesium hydroxide (MILK OF MAGNESIA) suspension 30 mL  30 mL Oral Daily PRN Verne Spurr, PA-C       Lab Results:  Results for orders placed during the hospital encounter of 05/07/12 (from the past 48 hour(s))  CBC     Status: Abnormal   Collection Time   05/19/12  6:15 AM      Component Value Range Comment   WBC 5.5  4.0 - 10.5 K/uL    RBC 4.13 (*) 4.22 - 5.81 MIL/uL    Hemoglobin 11.9 (*) 13.0 - 17.0 g/dL    HCT 16.1 (*) 09.6 - 52.0 %    MCV 89.3  78.0 - 100.0 fL    MCH 28.8  26.0 - 34.0 pg    MCHC 32.2  30.0 - 36.0 g/dL    RDW 04.5  40.9 - 81.1 %    Platelets  268  150 - 400 K/uL    Review of Systems:  Neurological: The patient denies any headaches today. He denies any seizures or dizziness.  G.I.: The patient denies any constipation or G.I. Upset today. He does report ongoing diarrhea which he states has been a problem for over a year. However the patient appears to be having constipation. Musculoskeletal: The patient denies any muscle or skeletal difficulties with the exception of his significant involuntary movements.  Skin: The patient has well healing bilateral superficial lacerations to his wrists. These are clean and healing well with no infection noted.   Time was spent today discussing with the patient his current symptoms. The patient reports to having ongoing difficulty sleeping last night due to diarrhea. However the patient appears to be having more constipation.  He reports a good appetite and denies any significant feelings of sadness, anhedonia and depressed mood today.  He report ongoing severe anxiety symptoms today stating that he is worried about his future and what to do with his POA. He denies any auditory or visual hallucinations or delusional thinking and adamantly denies any suicidal or homicidal ideations.   Time was spent discussing with the patient a possible discharge today.  However it was discovered that the patient's roommate "[name]" is out of town until Monday.  The treatment decided that he could not safely be discharged without having someone to care for him.  It is also probable that the patient will need an assistant living facility soon.  Treatment Plan Summary:  1. Daily contact with patient to assess and evaluate symptoms and progress in treatment.  2. Medication management  3. The patient will deny suicidal ideations or homicidal ideations for 48 hours prior to discharge and have a depression and anxiety rating of 3 or less. The patient will also deny any auditory or visual hallucinations or delusional thinking.  4.  The patient will deny any symptoms of substance withdrawal at time of discharge.   Plan:  1. Will continue the medication Prozac at 40 mgs po q am for depression.  2. Will continue the medication Haldol at 2 mgs po qhs to help with the patient's involuntary movements related to his Huntington's Disease as well as any paranoid delusions.  3. Will continue the medication Trazodone at 75 mgs po qhs for sleep.  4. Laboratory studies reviewed.  5. Will continue to monitor.  6. Probable discharge on Monday.  Darcella Shiffman 05/20/2012, 3:48 PM

## 2012-05-20 NOTE — Progress Notes (Signed)
Patient ID: Eric Waters, male   DOB: 1950/12/18, 61 y.o.   MRN: [id number] Patient becoming very angry, slamming drawers, yelling and going through his bags multiple times and upsetting his roommate. Staff inquired what was wrong, pt states "I can't find my favorite boxers! They were a gift from my daughter and I need them now!". RN asked patient to see what underwear he currently had on; pt in fact did have his missing clothes on his person. Pt immediately calmed down and apologized to staff. Pt with noted scratch to right hand; pt states he did it accidentally when he was searching his room. Staff will continue to monitor pt's behaviors.

## 2012-05-20 NOTE — Discharge Planning (Signed)
Met with patient in Aftercare Planning Group.   Also saw him in  Treatment Team, see plan update for details.  Left a message for his daughter informing her that we would not discharge him today or this weekend because we do not feel that he can/should go home alone.  Also told her that we would like family to start considering whether they want to place patient into an Assisted Living Facility and/or start process of guardianship.  Requested peer review to overturn utilization denial.  [name], LCSW 05/20/2012, 3:44 PM

## 2012-05-20 NOTE — Tx Team (Signed)
Interdisciplinary Treatment Plan Update (Adult)  Date:  05/20/2012  Time Reviewed:  10:15AM-11:15AM  Progress in Treatment: Attending groups:  Some Participating in groups:    When attends Taking medication as prescribed:    Yes Tolerating medication:   Yes Family/Significant other contact made:  Yes, ongoing Patient understands diagnosis:   Yes, fair insight, poor judgment  Discussing patient identified problems/goals with staff:   Yes Medical problems stabilized or resolved:   Yes Denies suicidal/homicidal ideation:  No Issues/concerns per patient self-inventory:   Yes Other:    New problem(s) identified: Yes, Describe:  patient tearful, continues to state he will not work with current POA  Reason for Continuation of Hospitalization: Anxiety Depression Medical Issues Medication stabilization  Interventions implemented related to continuation of hospitalization:  Medication monitoring and adjustment, safety checks Q15 min., suicide risk assessment, group therapy, psychoeducation, collateral contact, aftercare planning, ongoing physician assessments, medication education  Additional comments:  It is under discussion whether patient can truly take care of himself in an independent living situation, or whether he needs an Assisted Living Facility.  While the treatment team thinks he needs an ALF, he himself would be opposed to that idea (given his opposition to anything which takes his independence away), and so we have not yet pursued this.  His family has been informed that he may have the beginnings of Huntington's-related dementia and that they should start thinking about pursuing guardianship for him.  Estimated length of stay:  3 days  Discharge Plan:  Return to home with [name], follow up with neurologist  New goal(s):  Not applicable  Review of initial/current patient goals per problem list:   1.  Goal(s):  Medication stabilization  Met:  Yes  Target date:  By Discharge     As evidenced by:  Stable on current meds  2.  Goal(s):  Decrease depression to no greater than 3 at discharge.  Met:  Yes  Target date:  By Discharge   As evidenced by:  "0" today, but then starts weeping  3.  Goal(s):  Work on plans to reduce isolation at discharge.  Met:  No  Target date:  By Discharge   As evidenced by:  States he has worked on plans; however, it is noted by treatment team that his only local people include a roommate who is not significantly capable of caring for patient and a POA who is currently distrusted by patient  4.  Goal(s):  Deny SI for 48 hours prior to D/C.  Met:  Yes  Target date:  By Discharge   As evidenced by:  Denies  5.  Goal(s):  Reduce anxiety to no greater than 3 at discharge.  Met:  No  Target date:  By Discharge   As evidenced by:  "10 today due to anxiety re POA issues.  Patient tells story during Treatment Team of being declared fit by 3 doctors, then being committed nonetheless by POA.  Does not trust him, refuses to consider working with him.  Attendees: Patient:  Eric Waters  05/20/2012 10:15AM-11:15AM  Family:     Physician:  Dr. Harvie Heck Readling 05/20/2012 10:15AM-11:15AM  Nursing:     Case Manager:  [name], LCSW 05/20/2012 10:15AM-11:15AM  Counselor:  Veto Kemps, MT-BC 05/20/2012 10:15AM-11:15AM  Other:      Other:      Other:      Other:       Scribe for Treatment Team:   Sarina Ser, 05/20/2012, 10:15AM-11:15AM

## 2012-05-20 NOTE — Progress Notes (Signed)
05/20/2012         Time: 0930      Group Topic/Focus: The focus of the group is on enhancing the patients' ability to cope with stressors by understanding what coping is, why it is important, the negative effects of stress and developing healthier coping skills. Patients practice [place] and discuss how exercise can be used as a healthy coping strategy.   Participation Level: Active  Participation Quality: Appropriate  Affect: Appropriate  Cognitive: Alert   Additional Comments: Patient well engaged, able to identify positive activities he can use for relaxation upon discharge.  [name] 05/20/2012 1:27 PM

## 2012-05-20 NOTE — Progress Notes (Addendum)
Subjective: Patient denies any fever, chills, nausea, abdominal discomfort.  Sent a stool sample for c diff pcr testing but reportedly was a formed stool specimen and did not meet criteria.    No acute issues reported overnight.  Objective: Filed Vitals:   05/19/12 0600 05/19/12 0601 05/20/12 0554 05/20/12 0558  BP: 119/83 136/81 115/75 127/87  Pulse: 58 60 57 60  Temp: 96.9 F (36.1 C)  97.3 F (36.3 C)   TempSrc:   Oral   Resp: 15  16   Height:      Weight:      SpO2:       Weight change:  No intake or output data in the 24 hours ending 05/20/12 1712  General: Alert, awake, oriented x3, in no acute distress.  HEENT: No bruits, no goiter.  Heart: Regular rate and rhythm, without murmurs, rubs, gallops.  Lungs: Clear to auscultation, bilateral air movement.  Abdomen: Soft, nontender, nondistended, positive bowel sounds.  Neuro: Grossly intact, nonfocal.   Lab Results: No results found for this basename: NA:2,K:2,CL:2,CO2:2,GLUCOSE:2,BUN:2,CREATININE:2,CALCIUM:2,MG:2,PHOS:2 in the last 72 hours No results found for this basename: AST:2,ALT:2,ALKPHOS:2,BILITOT:2,PROT:2,ALBUMIN:2 in the last 72 hours No results found for this basename: LIPASE:2,AMYLASE:2 in the last 72 hours  Basename 05/19/12 0615  WBC 5.5  NEUTROABS --  HGB 11.9*  HCT 36.9*  MCV 89.3  PLT 268   No results found for this basename: CKTOTAL:3,CKMB:3,CKMBINDEX:3,TROPONINI:3 in the last 72 hours No components found with this basename: POCBNP:3 No results found for this basename: DDIMER:2 in the last 72 hours No results found for this basename: HGBA1C:2 in the last 72 hours No results found for this basename: CHOL:2,HDL:2,LDLCALC:2,TRIG:2,CHOLHDL:2,LDLDIRECT:2 in the last 72 hours No results found for this basename: TSH,T4TOTAL,FREET3,T3FREE,THYROIDAB in the last 72 hours No results found for this basename: VITAMINB12:2,FOLATE:2,FERRITIN:2,TIBC:2,IRON:2,RETICCTPCT:2 in the last 72 hours  Micro Results: No  results found for this or any previous visit (from the past 240 hour(s)).  Studies/Results: No results found.  Medications: I have reviewed the patient's current medications.  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 - addendum: at this point C diff is not likely - continue with fiber supplement and florastor. - pt with formed stools, afebrile, lack of abdominal discomfort, and normal WBC.   Infectious etiology less likely and diarrhea seems to be resolving.   - At this point patient does not need any further antibiotics.   - f/u stool cultures although at this point do not suspect infection  ALLERGIC RHINITIS (12/06/2009) - Continue current regimen, WBC within normal limits   Disposition:  Per psychiatry.  Will go ahead and sign off at this juncture and will be available for questions.  Thank you for letting me participate in the care of this patient.   LOS: 13 days   Penny Pia M.D. p [phone]  [facility] 05/20/2012, 5:12 PM

## 2012-05-20 NOTE — Progress Notes (Addendum)
  D) Patient pleasant and cooperative upon my assessment. Patient states slept " poor," "because I was up in the bathroom all night." Patient given fiber supplement with good results this morning.  Writer educated patient regarding  the difference between "constipation" and "diarrhea." Patient verbalizes understanding. Patient states appetite is "good." Patient rates depression as   4/10, patient rates hopeless feelings as  4/10. Patient denies SI/HI, denies A/V hallucinations. Patient has a plan to "socialize more," once he is discharged from [facility].  A) Patient offered support and encouragement, patient encouraged to discuss feelings/concerns with staff. Patient verbalized understanding. Patient monitored Q15 minutes for safety. Patient met with MD to discuss today's goals and plan of care.  R) Patient active on unit, attending groups in day room and meals in dining room.  Patient taking medications as ordered. Will continue to monitor.

## 2012-05-20 NOTE — Progress Notes (Signed)
[facility] Group Notes:  (Counselor/Nursing/MHT/Case Management/Adjunct)  05/20/2012 8:23 PM  Type of Therapy:  wrap up group  Participation Level:  Did Not Attend  Participation Quality:  did not attend  Affect:  Appropriate  Cognitive:  Appropriate  Insight:  did not attend  Engagement in Group:  did not attend  Engagement in Therapy:  did not attend  Modes of Intervention:  Support and did not attend  Summary of Progress/Problems: Eric Waters did not attend group because he wanted to take a shower.   Nichola Sizer 05/20/2012, 8:23 PMThe focus of this group is to help patients review their daily goal of treatment and discuss progress on daily workbooks.

## 2012-05-21 NOTE — Progress Notes (Signed)
Patient ID: Eric Waters, male   DOB: 1951-01-17, 61 y.o.   MRN: [id number]   D: Pt with times of agitation and anxiety, difficult to redirect at times. Pt remains restless and pacing when upset.  A: monitored q 15 minutes, encouraged group participation. Redirected behaviors while upset and encouraged pt to slow down and focus on triggers that upset him. Encouraged pt to share his feelings.  R: Pt calmed down significantly after redirection. Pt did not participate in group session but was compliant with night medications. Pt able to sleep and tolerated meds well.

## 2012-05-21 NOTE — Progress Notes (Signed)
Patient ID: Eric Waters, male   DOB: [birth year], 61 y.o.   MRN: [id number]  Problem: Major Depressive Disorder, Recurrent  D: Pt much more pleasant, cooperative with staff and is out in milieu but continues to endorse depression. Pt apologetic for previous night's behaviors.   A: monitored q 15 minutes, encouraged peer/staff interaction. Administered medications as ordered by MD. Encouraged sharing of feelings or needs.  R: Pt compliant with medications, denies SI or plans to harm himself at this time. Pt participated in group session; no inappropriate behaviors noted during shift.

## 2012-05-21 NOTE — Progress Notes (Signed)
Patient ID: Eric Waters, male   DOB: [birth year], 61 y.o.   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   The patient was seen today and reports the following:   ADL's: Intact.  Sleep: fair.  Appetite: The patient reports a good appetite today.   Mild>(1-10) >Severe  Hopelessness (1-10): 1-2  Depression (1-10): 0  Anxiety (1-10): 10   Suicidal Ideation: The patient adamantly denies any suicidal ideations today.  Plan: No  Intent: No  Means: No   Homicidal Ideation: The patient adamantly denies any homicidal ideations today.  Plan: No  Intent: No.  Means: No   General Appearance/Behavior: The patient remains cooperative today with this provider and is complaining of ongoing diarrhea. The patient however appears to be having hard stools with no diarrhea reported. Eye Contact: Good.  Speech: Appropriate in rate and volume with no pressuring noted today.  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  Mental Status: Alert and Oriented x 3.  Mood: No depression reported today.  Affect: Remains bright and full today.  Anxiety Level: Severe anxiety reported today. The patient states that he remains worried about his future and what to do with his POA.  Thought Process: wnl.  Thought Content: The patient denies any auditory or visual hallucinations today as well as any delusions thinking. There continues to be negative thinking related to his POA.  Perception: wnl.  Judgment: poor Insight: limited Cognition: Oriented to person, place and time.  Sleep:  Number of Hours: 4.75    Vital Signs:Blood pressure 132/72, pulse 66, temperature 97.9 F (36.6 C), temperature source Oral, resp. rate 18, height 6' (1.829 m), weight 83.915 kg (185 lb), SpO2 98.00%.  Current Medications: Current Facility-Administered Medications  Medication  Dose Route Frequency Provider Last Rate Last Dose  . acetaminophen (TYLENOL) tablet 650 mg  650 mg Oral Q6H PRN Verne Spurr, PA-C      . alum & mag hydroxide-simeth (MAALOX/MYLANTA) 200-200-20 MG/5ML suspension 30 mL  30 mL Oral Q4H PRN Verne Spurr, PA-C      . azelastine (ASTELIN) nasal spray 2 spray  2 spray Each Nare BH-qamhs Ronny Bacon, MD   2 spray at 05/21/12 0830  . FLUoxetine (PROZAC) capsule 40 mg  40 mg Oral Daily Curlene Labrum Readling, MD   40 mg at 05/21/12 0830  . fluticasone (FLONASE) 50 MCG/ACT nasal spray 2 spray  2 spray Each Nare Daily Curlene Labrum Readling, MD   2 spray at 05/21/12 0830  . haloperidol (HALDOL) tablet 2 mg  2 mg Oral Q2200 Curlene Labrum Readling, MD   2 mg at 05/21/12 2055  . loratadine (CLARITIN) tablet 10 mg  10 mg Oral Daily Curlene Labrum Readling, MD   10 mg at 05/21/12 0830  . psyllium (HYDROCIL/METAMUCIL) packet 1 packet  1 packet Oral Daily Penny Pia, MD   1 packet at 05/21/12 0830  . saccharomyces boulardii (FLORASTOR) capsule 250 mg  250 mg Oral BID Penny Pia, MD   250 mg at 05/21/12 1706  . traZODone (DESYREL) tablet 75 mg  75 mg Oral QHS Ronny Bacon, MD   75 mg at 05/21/12 2055   Lab Results:  No results found for this or any previous visit (from the past 48 hour(s)). Review of Systems:  Neurological: The patient denies any headaches today. He denies any seizures or  dizziness.  G.I.: The patient denies any constipation or G.I. Upset today. He does report ongoing diarrhea which he states has been a problem for over a year. However the patient appears to be having constipation. Musculoskeletal: The patient denies any muscle or skeletal difficulties with the exception of his significant involuntary movements.  Skin: The patient has well healing bilateral superficial lacerations to his wrists. These are clean and healing well with no infection noted.   Time was spent today discussing with the patient his current symptoms. Reports fair sleep. Thinks his  mood is getting better. No acute new issues. Walking around in hallways at times.     Treatment Plan Summary:  1. Daily contact with patient to assess and evaluate symptoms and progress in treatment.  2. Medication management  3. The patient will deny suicidal ideations or homicidal ideations for 48 hours prior to discharge and have a depression and anxiety rating of 3 or less. The patient will also deny any auditory or visual hallucinations or delusional thinking.  4. The patient will deny any symptoms of substance withdrawal at time of discharge.   Plan:  1. Will continue current meds 6. Probable discharge on Monday per primary team  Wonda Cerise 05/21/2012, 10:31 PM

## 2012-05-21 NOTE — Progress Notes (Signed)
D   Pt is pleasant on approach and has been interacting appropriately with other patients and staff   He has not demonstrated any of the aggressive behaviors he was showing on the prior shift    Pt reports improved mood and hopefulness has increased    He reports poor sleep and good appetite   He revealed his desire to have increased socialization   He believes that would help relieve some of his depression and anxiety  His chorea related movements of rigidity and involuntary movements have decreased and he appears to hold better control over same   Continues to complain of diarrhea unwitnessed by staff   He denies suicidal and homicidal ideation A   Verbal support given   Praise efforts at improving his coping skills and socialization  Praise and encourage continued appropriate behaviors   Medications administered and effectiveness monitored   Q 15 min checks R   Pt safe at present and reporting continued improvement in mood

## 2012-05-21 NOTE — Progress Notes (Signed)
Patient ID: Eric Waters, male   DOB: 1951-03-25, 61 y.o.   MRN: [id number]   [facility] Group Notes:  (Counselor/Nursing/MHT/Case Management/Adjunct)  05/21/2012 11 AM  Type of Therapy:  Aftercare Planning, Group Therapy, Dance/Movement Therapy   Participation Level:  Active  Participation Quality:  Appropriate and Attentive  Affect:  Appropriate  Cognitive:  Appropriate  Insight:  Good  Engagement in Group:  Good  Engagement in Therapy:  Good  Modes of Intervention:  Clarification, Problem-solving, Role-play, Socialization and Support  Summary of Progress/Problems: After Care: Pt. attended and participated in aftercare planning group. Pt. verbally accepted information on suicide prevention, warning signs to look for with suicide and crisis line numbers to use. Pt. reported that he was not feeling so good today but better than he has been.  Counseling:  Therapist and group members discussed self-sabotaging behaviors such as over thinking, worrying, and isolating and some reasons why we may do these things. Therapist and group members then discussed positive coping skills that can be used in place of self-sabotaging behaviors. Pt. shared that in the past he would isolate himself. Pt. shared that he is looking to do more positive things in the future such as cooking and inviting friends over to his home.   Cassidi Long

## 2012-05-22 NOTE — Progress Notes (Signed)
D   Pt is pleasant and appropriate   He interacts well with others and has good insight into his illness and has developed good coping skills   He attended group on spirituality and breaking family cycles/yokes   He said his father was an alcoholic and that he ended up becoming one too but has been substance free for 15 years and feels like he was able to break the cycle of alcoholism in his family    A   Verbal support given  Medications administered and effectiveness monitored   Praise use of positive coping skills and insight   Q 15 min checks R   Pt safe at present and maintains positive outlook

## 2012-05-22 NOTE — Progress Notes (Signed)
Patient ID: Eric Waters, male   DOB: 10/27/1951, 61 y.o.   MRN: [id number]   [place] [facility] Group Notes:  (Counselor/Nursing/MHT/Case Management/Adjunct)  05/22/2012 11 AM  Type of Therapy:  Aftercare Planning, Group Therapy, Dance/Movement Therapy   Participation Level:  Active  Participation Quality:  Appropriate  Affect:  Appropriate  Cognitive:  Appropriate  Insight:  Good  Engagement in Group:  Good  Engagement in Therapy:  Good  Modes of Intervention:  Clarification, Problem-solving, Role-play, Socialization and Support  Summary of Progress/Problems: After Care: Pt. attended and participated in aftercare planning group. Therapist asked group to share their current mood based on a weather forecast and pt. shared that he was feeling "in between sunny and cloudy." Pt. verbally accepted information on suicide prevention, warning signs to look for with suicide and crisis line numbers to use.  Counseling:  Therapist and group members discussed positive and negative supports that we have in our lives. Therapist invited group members to share supports in their lives, real or a character, and how they support themselves to begin the day. Pt. was very sharing and attentive during group and spoke about his recent engagement.   Cassidi Long

## 2012-05-22 NOTE — Progress Notes (Signed)
Patient ID: Eric Waters, male   DOB: 07/06/1951, 61 y.o.   MRN: [id number]  Problem: Major Depressive Disorder  D: Pt out in milieu and more outgoing with peers/staff. Pt with improvement in mood and is maintaining hygiene and with good appetite.   A: monitored q 15 minutes, encouraged continued peer/staff interaction and group attendance and participation. Administered medications as ordered by MD. Encouraged pt to share feelings.  R: Pt participated in group session, continues to interact well in milieu. Pt compliant with his HS medications and denies SI or plans to harm himself. Pt states he is looking forward to getting married soon and feels comfortable in the fact that he is leaving his church. Pt states he is going to look after himself at this time.

## 2012-05-22 NOTE — Progress Notes (Signed)
Patient ID: Eric Waters, male   DOB: 04-Oct-1951, 61 y.o.   MRN: [id number] [facility] MD Progress Note  05/22/2012 6:32 PM  Diagnosis:  Axis I: Major Depressive Disorder - Recurrent.   The patient was seen today and reports the following:   ADL's: Intact.  Sleep: fair.  Appetite: The patient reports a good appetite today.   Mild>(1-10) >Severe  Hopelessness (1-10): 1-2  Depression (1-10): 0  Anxiety (1-10): 7  Suicidal Ideation: The patient adamantly denies any suicidal ideations today.  Plan: No  Intent: No  Means: No   Homicidal Ideation: The patient adamantly denies any homicidal ideations today.  Plan: No  Intent: No.  Means: No   General Appearance/Behavior: The patient remains cooperative today with this provider and is complaining of ongoing diarrhea. The patient however appears to be having hard stools with no diarrhea reported. Eye Contact: Good.  Speech: Appropriate in rate and volume with no pressuring noted today.  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  Mental Status: Alert and Oriented x 3.  Mood: less depressed today.  Affect: Remains bright and full today.  Anxiety Level: Severe anxiety reported today. The patient states that he remains worried about his future and what to do with his POA.  Thought Process: wnl.  Thought Content: The patient denies any auditory or visual hallucinations today as well as any delusions thinking. There continues to be negative thinking related to his POA.  Perception: wnl.  Judgment: poor Insight: limited Cognition: Oriented to person, place and time.  Sleep:  Number of Hours: 5    Vital Signs:Blood pressure 139/69, pulse 64, temperature 98 F (36.7 C), temperature source Oral, resp. rate 18, height 6' (1.829 m), weight 83.915 kg (185 lb), SpO2 98.00%.  Current Medications: Current Facility-Administered Medications  Medication Dose Route  Frequency Provider Last Rate Last Dose  . acetaminophen (TYLENOL) tablet 650 mg  650 mg Oral Q6H PRN Verne Spurr, PA-C      . alum & mag hydroxide-simeth (MAALOX/MYLANTA) 200-200-20 MG/5ML suspension 30 mL  30 mL Oral Q4H PRN Verne Spurr, PA-C      . azelastine (ASTELIN) nasal spray 2 spray  2 spray Each Nare BH-qamhs Ronny Bacon, MD   2 spray at 05/22/12 [phone]  . FLUoxetine (PROZAC) capsule 40 mg  40 mg Oral Daily Curlene Labrum Readling, MD   40 mg at 05/22/12 0820  . fluticasone (FLONASE) 50 MCG/ACT nasal spray 2 spray  2 spray Each Nare Daily Curlene Labrum Readling, MD   2 spray at 05/22/12 0821  . haloperidol (HALDOL) tablet 2 mg  2 mg Oral Q2200 Curlene Labrum Readling, MD   2 mg at 05/21/12 2055  . loratadine (CLARITIN) tablet 10 mg  10 mg Oral Daily Curlene Labrum Readling, MD   10 mg at 05/22/12 0820  . psyllium (HYDROCIL/METAMUCIL) packet 1 packet  1 packet Oral Daily Penny Pia, MD   1 packet at 05/22/12 0820  . saccharomyces boulardii (FLORASTOR) capsule 250 mg  250 mg Oral BID Penny Pia, MD   250 mg at 05/22/12 1725  . traZODone (DESYREL) tablet 75 mg  75 mg Oral QHS Ronny Bacon, MD   75 mg at 05/21/12 2055   Lab Results:  No results found for this or any previous visit (from the past 48 hour(s)). Review of Systems:  Neurological: The patient denies any headaches today. He denies any seizures or dizziness.  G.I.: The patient denies any constipation or G.I. Upset today. He does report ongoing diarrhea which he states has been a problem for over a year. However the patient appears to be having constipation. Musculoskeletal: The patient denies any muscle or skeletal difficulties with the exception of his significant involuntary movements.  Skin: The patient has well healing bilateral superficial lacerations to his wrists. These are clean and healing well with no infection noted.   Time was spent today discussing with the patient his current symptoms. Reports fair sleep. More sleepy in AM today.  Thinks his mood is getting better. No acute new issues. Less active today.    Treatment Plan Summary:  1. Daily contact with patient to assess and evaluate symptoms and progress in treatment.  2. Medication management  3. The patient will deny suicidal ideations or homicidal ideations for 48 hours prior to discharge and have a depression and anxiety rating of 3 or less. The patient will also deny any auditory or visual hallucinations or delusional thinking.  4. The patient will deny any symptoms of substance withdrawal at time of discharge.   Plan:  1. Will continue current meds 6. Probable discharge on Monday per primary team  Wonda Cerise 05/22/2012, 6:32 PM

## 2012-05-23 MED ORDER — FLUOXETINE HCL 40 MG PO CAPS: 40.0000 mg | ORAL_CAPSULE | Freq: Every day | ORAL | Status: DC

## 2012-05-23 MED ORDER — LOPERAMIDE HCL 2 MG PO TABS: 8.0000 mg | ORAL_TABLET | ORAL | Status: DC

## 2012-05-23 MED ORDER — TRAZODONE HCL 50 MG PO TABS
75.0000 mg | ORAL_TABLET | Freq: Every day | ORAL | Status: DC
  Filled 2012-05-23: qty 21

## 2012-05-23 MED ORDER — SACCHAROMYCES BOULARDII 250 MG PO CAPS: 250.0000 mg | ORAL_CAPSULE | Freq: Two times a day (BID) | ORAL | Status: AC

## 2012-05-23 MED ORDER — CETIRIZINE HCL 10 MG PO TABS
10.0000 mg | ORAL_TABLET | Freq: Every day | ORAL | Status: DC
  Filled 2012-05-23 (×2): qty 1

## 2012-05-23 MED ORDER — PSYLLIUM 95 % PO PACK: 1.0000 | PACK | Freq: Every day | ORAL | Status: DC

## 2012-05-23 MED ORDER — TRAZODONE 25 MG HALF TABLET: 75.0000 mg | ORAL_TABLET | Freq: Every day | ORAL | Status: DC

## 2012-05-23 MED ORDER — TRAZODONE HCL 50 MG PO TABS: 75.0000 mg | ORAL_TABLET | Freq: Every day | ORAL | Status: DC

## 2012-05-23 MED ORDER — CETIRIZINE HCL 10 MG PO TABS: 10.0000 mg | ORAL_TABLET | Freq: Every day | ORAL | Status: DC

## 2012-05-23 MED ORDER — MOMETASONE FUROATE 50 MCG/ACT NA SUSP: 2.0000 | Freq: Every day | NASAL | Status: DC

## 2012-05-23 MED ORDER — AZELASTINE HCL 0.1 % NA SOLN: 2.0000 | Freq: Two times a day (BID) | NASAL | Status: DC

## 2012-05-23 MED ORDER — HALOPERIDOL 2 MG PO TABS: 2.0000 mg | ORAL_TABLET | Freq: Every day | ORAL | Status: DC

## 2012-05-23 NOTE — Discharge Summary (Signed)
Physician Discharge Summary Note  Patient:  Eric Waters is an 60 y.o., male MRN:  [id number] DOB:  [birth year] Patient phone:  [phone] (home)  Patient address:   [address]  [place] [address],   Date of Admission:  05/07/2012 Date of Discharge: 05/23/2012  Reason for Admission: MDD recurrent                                          Paranoid delusions  Discharge Diagnoses: Principal Problem:  *Major depressive disorder, recurrent Active Problems:  HUNTINGTONS CHOREA  ALLERGIC RHINITIS  SIALADENITIS  GERD   Axis Diagnosis:  Discharge Diagnoses:  AXIS I: Major Depressive Disorder - Recurrent.  AXIS II: Deferred  AXIS III: 1. Seasonal Allergies.  2. Huntington's Chorea.  3. Gastroesophageal Reflux Disease.  AXIS IV: Chronic Mental Illness. Chronic Serious Health Issues.  AXIS V: GAF at time of admission approximately 45. GAF at time of discharge approximately 50.  Level of Care:  Out patient    Hospital Course:  Eric Waters was admitted due to increasing depression with isolation, weight loss, and some delusional thinking.  He was treated with his regular Prozac at 40mg  and Haldol was initiated to 2 mg.  He was also evaluated by IM for upper respiratory symptoms which were felt to be viral in nature, as well as for some chronic diarrhea.  He was discontinued off of the Immodium AD and psyllium was added as well as a pro-biotic Florastor to help with gastrointestinal health.  Eric Waters's sympotms did improve including a decrease in his Choreaform movements.  He noted improved sleep and increase in appetite.  On the day of discharge Eric Waters was in much improved condition and was medically stable. He was discharged back to his residence with his roommate "Eric Waters" who was able to come and pick him up.  Eric Waters was very pleasant and cooperative with the staff and appeared happy to be going home but sad to be leaving the supportive atmosphere of the hospital.  Consults:  Internal  medicine  Significant Diagnostic Studies:  labs: please see labs  Discharge Vitals:   Blood pressure 149/89, pulse 91, temperature 97.9 F (36.6 C), temperature source Oral, resp. rate 18, height 6' (1.829 m), weight 83.915 kg (185 lb), SpO2 98.00%.  Mental Status Exam: See Mental Status Examination and Suicide Risk Assessment completed by Attending Physician prior to discharge.  Discharge destination:  Home Is patient on multiple antipsychotic therapies at discharge:  No   Has Patient had three or more failed trials of antipsychotic monotherapy by history:  No Recommended Plan for Multiple Antipsychotic Therapies: not applicable    Discharge Orders    Future Orders Please Complete By Expires   Diet - low sodium heart healthy      Increase activity slowly      Discharge instructions      Comments:   Take all medications as prescribed.  Be sure to keep all follow up appointments so that you get your mediations and refills on time.  Do not skip or miss any doses of your medication.     Medication List  As of 05/23/2012 11:16 AM   STOP taking these medications         fluticasone 50 MCG/ACT nasal spray         TAKE these medications      Indication    azelastine 137 MCG/SPRAY  nasal spray   Commonly known as: ASTELIN   Place 2 sprays into the nose 2 (two) times daily. Use in each nostril as directed for seasonal allergies.    Indication: Hayfever      cetirizine 10 MG tablet   Commonly known as: ZYRTEC   Take 1 tablet (10 mg total) by mouth daily. For seasonal allergies.       FLUoxetine 40 MG capsule   Commonly known as: PROZAC   Take 1 capsule (40 mg total) by mouth daily. For depression and anxiety.       haloperidol 2 MG tablet   Commonly known as: HALDOL   Take 1 tablet (2 mg total) by mouth daily at 10 pm. For mental clarity and delusions.    Indication: Huntington's Chorea      loperamide 2 MG tablet   Commonly known as: IMODIUM A-D   Take 4 tablets (8 mg  total) by mouth every morning. For diarrhea.       mometasone 50 MCG/ACT nasal spray   Commonly known as: NASONEX   2 sprays by Nasal route daily.       psyllium 95 % Pack   Commonly known as: HYDROCIL/METAMUCIL   Take 1 packet by mouth daily. For bowel regularity.       saccharomyces boulardii 250 MG capsule   Commonly known as: FLORASTOR   Take 1 capsule (250 mg total) by mouth 2 (two) times daily. For gastrointestinal health.       traZODone 25 mg Tabs   Commonly known as: DESYREL   Take 1.5 tablets (75 mg total) by mouth at bedtime. For insomnia.    Indication: Trouble Sleeping           Follow-up Information    Follow up with Dr. Dan Humphreys on 07/12/2012. (1:00PM appointment)    Contact information:   [facility]. [place] Telephone:  [phone]         Follow-up recommendations:  Eat a heart healthy diet.  Exercise daily as tolerated. Get plenty of rest.   Comments:    Signed: Lloyd Huger T. Camrin Gearheart PAC for Dr.Randy D. Readling 05/23/2012, 11:16 AM

## 2012-05-23 NOTE — Progress Notes (Signed)
Psychoeducational Group Note  Date:  05/23/2012 Time:  11.00am  Group Topic/Focus:  Self Care:   The focus of this group is to help patients understand the importance of self-care in order to improve or restore emotional, physical, spiritual, interpersonal, and financial health.  Participation Level:  Active  Participation Quality:  Appropriate  Affect:  Appropriate  Cognitive:  Appropriate  Insight:  Good  Engagement in Group:  Good  Additional Comments:  Pt acknowledges the need to stop total isolation when stressed. Pt said socializing helps him deal with stressful events.  Eric Waters, Eric Waters 05/23/2012, 1:17 PM

## 2012-05-23 NOTE — Progress Notes (Signed)
[facility] Adult Inpatient Family/Significant Other Suicide Prevention Education  Suicide Prevention Education:  Education Completed; Eric Waters (friend, patient lives in his home) [phone]) has been identified by the patient as the family member/significant other with whom the patient will be residing, and identified as the person(s) who will aid the patient in the event of a mental health crisis (suicidal ideations/suicide attempt).  With written consent from the patient, the family member/significant other has been provided the following suicide prevention education, prior to the and/or following the discharge of the patient.  The suicide prevention education provided includes the following:  Suicide risk factors  Suicide prevention and interventions  National Suicide Hotline telephone number  [facility] assessment telephone number  [facility] - Inpatient Emergency Assistance 911  [facility] and/or Residential Mobile Crisis Unit telephone number  Request made of family/significant other to:  Remove weapons (e.g., guns, rifles, knives), all items previously/currently identified as safety concern.    Remove drugs/medications (over-the-counter, prescriptions, illicit drugs), all items previously/currently identified as a safety concern.  The family member/significant other verbalizes understanding of the suicide prevention education information provided.  The family member/significant other agrees to remove the items of safety concern listed above.  Eric Waters stated that he has never dealt with this before. Counselor educated him on the above information. Also let him know that communication had taken place between daughter and case manager about the future and how to handle with his continued physical regression.  Eric Waters, Eric Waters 05/23/2012, 2:21 PM

## 2012-05-23 NOTE — [facility] Suicide Risk Assessment (Signed)
Suicide Risk Assessment  Discharge Assessment     Demographic factors:  Divorced or widowed;Unemployed  Current Mental Status Per Nursing Assessment::   On Admission:  Suicidal ideation indicated by patient At Discharge:  The patient is AO x 3.  He remains friendly and cooperative with this provider.  He reports to sleeping well and reports a good appetite.  The patient denies any significant feelings of sadness, anhedonia or depressed mood as well as adamantly denies any suicidal or homicidal ideations.  In addition, he denies any auditory or visual hallucinations or delusional thinking.  The patient reports "severe" anxiety but does not appear anxious.  He states he is worried about having his POA changed from "Fraser Din" to another friend but states he will arrange for this after discharge.  He states he will call his roommate "[name]" today and will return home with him and to outpatient follow up.  Current Mental Status Per Physician:  Diagnosis:  Axis I: Major Depressive Disorder - Recurrent.   The patient was seen today and reports the following:   ADL's: Intact.  Sleep: The patient reports to having some mild difficulty initiating and maintaining sleep last night.  Appetite: The patient reports a good appetite today.   Mild>(1-10) >Severe  Hopelessness (1-10): 0  Depression (1-10): 0  Anxiety (1-10): 10   Suicidal Ideation: The patient adamantly denies any suicidal ideations today.  Plan: No  Intent: No  Means: No   Homicidal Ideation: The patient adamantly denies any homicidal ideations today.  Plan: No  Intent: No.  Means: No   General Appearance/Behavior: The patient remains cooperative today with this provider.  Eye Contact: Good.  Speech: Appropriate in rate and volume with no pressuring noted today.  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  Level of  Consciousness: Alert and Oriented x 3.  Mental Status: Alert and Oriented x 3.  Mood: No depression reported today.  Affect: Remains bright and full today.  Anxiety Level: Severe anxiety reported today. The patient states that he remains worried about his future and what to do with his POA.  He however does not appear anxious.  Thought Process: wnl.  Thought Content: The patient denies any auditory or visual hallucinations today as well as any delusions thinking. There continues to be negative thinking related to his POA.  Perception: wnl.  Judgment: Fair.  Insight: Fair.  Cognition: Oriented to person, place and time.   Loss Factors: Decline in physical health  Historical Factors: History of depression.  History of Huntington's Disease.  Risk Reduction Factors:   Good family support.  Good access to healthcare.  Continued Clinical Symptoms:  Severe Anxiety and/or Agitation Depression:   Insomnia Previous Psychiatric Diagnoses and Treatments Medical Diagnoses and Treatments/Surgeries  Discharge Diagnoses:   AXIS I:   Major Depressive Disorder - Recurrent.  AXIS II:   Deferred AXIS III:   1. Seasonal Allergies.   2. Huntington's Chorea.   3. Gastroesophageal Reflux Disease. AXIS IV:   Chronic Mental Illness.  Chronic Serious Health Issues.   AXIS V:   GAF at time of admission approximately 45.  GAF at time of discharge approximately 50.  Cognitive Features That Contribute To Risk:  Thought constriction (tunnel vision)    Review of Systems:  Neurological: The patient denies any headaches today. He denies any seizures or dizziness.  G.I.: The patient reports constipation today.  Musculoskeletal: The patient denies any muscle  or skeletal difficulties with the exception of his significant involuntary movements.    Time was spent today discussing with the patient his current symptoms.  The patient is AO x 3.  He remains friendly and cooperative with this provider.  He reports  to sleeping well and reports a good appetite.  The patient denies any significant feelings of sadness, anhedonia or depressed mood as well as adamantly denies any suicidal or homicidal ideations.  In addition, he denies any auditory or visual hallucinations or delusional thinking.  The patient reports "severe" anxiety but does not appear anxious.  He states he is worried about having his POA changed from "Fraser Din" to another friend but states he will arrange for this after discharge.  He states he will call his roommate "[name]" today and will return home with him and to outpatient follow up.  Treatment Plan Summary:  1. Daily contact with patient to assess and evaluate symptoms and progress in treatment.  2. Medication management  3. The patient will deny suicidal ideations or homicidal ideations for 48 hours prior to discharge and have a depression and anxiety rating of 3 or less. The patient will also deny any auditory or visual hallucinations or delusional thinking.  4. The patient will deny any symptoms of substance withdrawal at time of discharge.   Plan:  1. Will continue the medication Prozac at 40 mgs po q am for depression.  2. Will continue the medication Haldol at 2 mgs po qhs to help with the patient's involuntary movements related to his Huntington's Disease as well as any paranoid delusions.  3. Will continue the medication Trazodone at 75 mgs po qhs for sleep.  4. Laboratory studies reviewed.  5. Will continue to monitor.  6. Discharge today to outpatient follow up.  Suicide Risk:  Minimal: No identifiable suicidal ideation.  Patients presenting with no risk factors but with morbid ruminations; may be classified as minimal risk based on the severity of the depressive symptoms  Plan Of Care/Follow-up recommendations:  Activity:  As tolerated. Diet:  Regular Diet. Other:  Please take all medications only as directed and keep all scheduled follow up appointments.  [name] 05/23/2012, 12:32 PM

## 2012-05-23 NOTE — Progress Notes (Signed)
[facility] Case Management Discharge Plan:  Will you be returning to the same living situation after discharge: Yes,  with roommate At discharge, do you have transportation home?:Yes,  roommate will pick up Do you have the ability to pay for your medications:Yes,  insurance and income  Interagency Information:     Release of information consent forms completed and in the chart;  Patient's signature needed at discharge.  Patient to Follow up at:  Follow-up Information    Follow up with Dr. Dan Humphreys on 07/12/2012. (1:00PM appointment)    Contact information:   [facility]. [place] Telephone:  [phone]      Call [facility]. (As needed go to classes Monday-Thursday 10:00AM-11:30AM to learn new things, meet new people.)    Contact information:   [address] [place]  [address] Telephone:  [phone]         Patient denies SI/HI:   Yes,      Safety Planning and Suicide Prevention discussed:  Yes,  Several times during Aftercare Planning Group, Case Manager provided psychoeducation on "Suicide Prevention Information."  This included descriptions of risk factors for suicide, warning signs that an individual is in crisis and thinking of suicide, and what to do if this occurs.  Pt indicated understanding of information provided, and will read brochure given upon discharge.     Barrier to discharge identified:No.  Summary and Recommendations:  Patient continues to be resistant to his current POA.  Case Manager spoke numerous times with family, and advised them to be proactive in the process of getting that switched or encouraging his cooperation.  Also told them to watch for signs he can no longer function simply with a POA, but may need a guardian, especially re placement issues if he gets to the point where he can no longer make decisions re living situation safely.   Sarina Ser 05/23/2012,  3:58 PM

## 2012-05-23 NOTE — Progress Notes (Signed)
[facility] Group Notes:  (Counselor/Nursing/MHT/Case Management/Adjunct)  05/23/2012 2:25 PM  Type of Therapy:  Group Therapy  Participation Level:  Active  Participation Quality:  Attentive and Sharing  Affect:  Blunted  Cognitive:  Oriented  Insight:  Limited  Engagement in Group:  Good  Engagement in Therapy:  Good  Modes of Intervention:  Clarification, Education, Problem-solving and Support  Summary of Progress/Problems: Patient was active in discussion of wellness. He stated that he was going to have to exercise more and get more involved in cooking. He related to other peers as they discussed isolating as a way of coping and how this was not good for him.   Idell Hissong, Aram Beecham 05/23/2012, 2:25 PM

## 2012-05-23 NOTE — Progress Notes (Signed)
Patient continues to be appropriate on the unit.  He report no SI/HI/AVH.  He reports he is having diarrhea which is keeping his from sleeping; however, is also complaining of constipation and still taking his fiber.  He is socializing well on the unit; attending groups and pleasant with staff.  He is up for discharge today.  He plans to return home with a roommate.    Continue to monitor q15 for safety; encourage to continue programming groups on unit until discharge.  Continue to medication administration.  Patient responding well to treatment.  Met with treatment team this morning to discuss discharge plans.  Rates his depression as a 1.

## 2012-05-23 NOTE — Progress Notes (Signed)
Patient discharged home per MD order.  Patient denies any SI/HI/AVH.  He will follow up with his neurologist in [place] and the [facility] for classes in [place].  Patient received a supply of medications and his prescriptions.  He received all of his personal belongings.  Patient had a variety of loose pills in a weekly plastic pill container in his locker.  These were destroyed per MD request.  Patient was confused as to what they all were.  Patient understood all his follow up appointments and discharge instructions.  Patient was walked out at 1630.  Was picked up by [name], his roommate.    Patient was pleasant and cooperative.  Appreciative of staff's assistance.

## 2012-05-23 NOTE — Progress Notes (Signed)
05/23/2012         Time: 0930      Group Topic/Focus: The focus of this group is on enhancing the patient's understanding of leisure, barriers to leisure, and the importance of engaging in positive leisure activities upon discharge for improved total health.  Participation Level: Active  Participation Quality: Appropriate  Affect: Appropriate  Cognitive: Alert  Additional Comments: Patient able to identify positive leisure activities to engage in upon discharge.   Midge Momon 05/23/2012 12:17 PM

## 2012-05-23 NOTE — Tx Team (Signed)
Interdisciplinary Treatment Plan Update (Adult)  Date:  05/23/2012  Time Reviewed:  10:15AM-11:15AM  Progress in Treatment: Attending groups:  Yes Participating in groups:    Yes Taking medication as prescribed:    Yes Tolerating medication:   Yes Family/Significant other contact made: Yes  Patient understands diagnosis:   Yes, fair insight, poor judgment Discussing patient identified problems/goals with staff:   Yes Medical problems stabilized or resolved:   Yes Denies suicidal/homicidal ideation:  Yes Issues/concerns per patient self-inventory:   None Other:    New problem(s) identified: No, Describe:    Reason for Continuation of Hospitalization: None  Interventions implemented related to continuation of hospitalization:  Medication monitoring and adjustment, safety checks Q15 min., suicide risk assessment, group therapy, psychoeducation, collateral contact, aftercare planning, ongoing physician assessments, medication education - UNTIL DISCHARGE  Additional comments:  Not applicable  Estimated length of stay:  Discharge today  Discharge Plan:  Go home with roommate [name]; follow up with neurologist  New goal(s):  Not applicable  Review of initial/current patient goals per problem list:   1.  Goal(s):  Medication stabilization  Met:  Yes  Target date:  By Discharge   As evidenced by:  Stable on current meds  2.  Goal(s):  Decrease depression to no greater than 3 at discharge.  Met:  Yes  Target date:  By Discharge   As evidenced by:  "0" today  3.  Goal(s):  Work on plans to reduce isolation at discharge.  Met:  Yes  Target date:  By Discharge   As evidenced by:  Feels better about this  4.  Goal(s):  Deny SI for 48 hours prior to D/C.  Met:  Yes  Target date:  By Discharge   As evidenced by:  Continues to deny  5. Goal(s): Reduce anxiety to no greater than 3 at discharge.  Met: Deferred Target date: By Discharge  As evidenced by: States anxiety  is still at "10" because he does not know what will happen about his Power of [place].  Patient and family have both been told this can be dealt with outside of hospital setting.   Attendees: Patient:  Eric Waters  05/23/2012 10:15AM-11:15AM  Family:     Physician:  Dr. Harvie Heck Readling 05/23/2012 10:15AM-11:15AM  Nursing:   Neill Loft, RN 05/23/2012 10:15AM -11:15AM   Case Manager:  [name], LCSW 05/23/2012 10:15AM-11:15AM  Counselor:  Veto Kemps, MT-BC 05/23/2012 10:15AM-11:15AM  Other:   Joslyn Devon, RN 05/23/2012   Other:      Other:      Other:       Scribe for Treatment Team:   Sarina Ser, 05/23/2012, 10:15AM-11:15AM

## 2012-05-23 NOTE — Discharge Planning (Signed)
Met with patient in Aftercare Planning Group. Provided with new folder and today's workbook, introduced idea of the curriculum which starts today, as well as today's topic of "Wellness."   Patient said he wanted to talk to the insurance company and find out if it was his Power of [employer] who had "put a stop to the insurance".  Case Manager explained that the insurance decision to stop coverage for this hospitalization has nothing to do with the POA, but rather is related to how long he has been in the hospital and how he is now really able to continue his recuperation on an outpatient basis.  Patient continues to complain of diarrhea, states it is keeping him from sleeping at night.  The record shows that he slept 5.25 hours last night.  Utilization review is due today if patient continues to stay.  [name], LCSW 05/23/2012, 10:29 AM

## 2012-05-25 NOTE — Progress Notes (Signed)
Patient Discharge Instructions:  After Visit Summary (AVS):   Faxed to:  05/24/2012 Psychiatric Admission Assessment Note:   Faxed to:  05/24/2012 Suicide Risk Assessment - Discharge Assessment:   Faxed to:  05/24/2012 Faxed/Sent to the Next Level Care provider:  05/24/2012  Faxed to [facility] - Dr. Dan Humphreys @ [phone]  Wandra Scot, 05/25/2012, 4:41 PM

## 2013-04-08 ENCOUNTER — Encounter (HOSPITAL_COMMUNITY): Payer: Self-pay | Admitting: Adult Health

## 2013-04-08 ENCOUNTER — Inpatient Hospital Stay (HOSPITAL_COMMUNITY)
Admission: EM | Admit: 2013-04-08 | Discharge: 2013-04-10 | DRG: 683 | Disposition: A | Discharge status: Home / Self Care | Payer: Medicare Other | Attending: Internal Medicine | Admitting: Internal Medicine

## 2013-04-08 ENCOUNTER — Other Ambulatory Visit: Payer: Self-pay

## 2013-04-08 ENCOUNTER — Emergency Department (HOSPITAL_COMMUNITY): Payer: Medicare Other

## 2013-04-08 DIAGNOSIS — K1120 Sialoadenitis, unspecified: Secondary | ICD-10-CM

## 2013-04-08 DIAGNOSIS — K529 Noninfective gastroenteritis and colitis, unspecified: Secondary | ICD-10-CM

## 2013-04-08 DIAGNOSIS — Z823 Family history of stroke: Secondary | ICD-10-CM

## 2013-04-08 DIAGNOSIS — N179 Acute kidney failure, unspecified: Principal | ICD-10-CM | POA: Diagnosis present

## 2013-04-08 DIAGNOSIS — E860 Dehydration: Secondary | ICD-10-CM | POA: Diagnosis present

## 2013-04-08 DIAGNOSIS — L600 Ingrowing nail: Secondary | ICD-10-CM

## 2013-04-08 DIAGNOSIS — G10 Huntington's disease: Secondary | ICD-10-CM | POA: Diagnosis present

## 2013-04-08 DIAGNOSIS — K219 Gastro-esophageal reflux disease without esophagitis: Secondary | ICD-10-CM | POA: Diagnosis present

## 2013-04-08 DIAGNOSIS — K5289 Other specified noninfective gastroenteritis and colitis: Secondary | ICD-10-CM | POA: Diagnosis present

## 2013-04-08 DIAGNOSIS — Z809 Family history of malignant neoplasm, unspecified: Secondary | ICD-10-CM

## 2013-04-08 DIAGNOSIS — N183 Chronic kidney disease, stage 3 unspecified: Secondary | ICD-10-CM | POA: Diagnosis present

## 2013-04-08 DIAGNOSIS — F339 Major depressive disorder, recurrent, unspecified: Secondary | ICD-10-CM

## 2013-04-08 DIAGNOSIS — J309 Allergic rhinitis, unspecified: Secondary | ICD-10-CM

## 2013-04-08 DIAGNOSIS — E872 Acidosis, unspecified: Secondary | ICD-10-CM | POA: Diagnosis present

## 2013-04-08 DIAGNOSIS — K7689 Other specified diseases of liver: Secondary | ICD-10-CM | POA: Diagnosis present

## 2013-04-08 DIAGNOSIS — R197 Diarrhea, unspecified: Secondary | ICD-10-CM

## 2013-04-08 DIAGNOSIS — R509 Fever, unspecified: Secondary | ICD-10-CM | POA: Diagnosis present

## 2013-04-08 DIAGNOSIS — R7989 Other specified abnormal findings of blood chemistry: Secondary | ICD-10-CM | POA: Diagnosis present

## 2013-04-08 DIAGNOSIS — R112 Nausea with vomiting, unspecified: Secondary | ICD-10-CM

## 2013-04-08 LAB — CBC WITH DIFFERENTIAL/PLATELET
Basophils Absolute: 0 10*3/uL (ref 0.0–0.1)
Eosinophils Absolute: 0 10*3/uL (ref 0.0–0.7)
Eosinophils Relative: 0 % (ref 0–5)
HCT: 43.8 % (ref 39.0–52.0)
Lymphs Abs: 1 10*3/uL (ref 0.7–4.0)
MCH: 29.6 pg (ref 26.0–34.0)
MCV: 84.2 fL (ref 78.0–100.0)
Monocytes Absolute: 0.2 10*3/uL (ref 0.1–1.0)
Platelets: 281 10*3/uL (ref 150–400)
RDW: 12.3 % (ref 11.5–15.5)

## 2013-04-08 LAB — TROPONIN I: Troponin I: <0.3 ng/mL (ref <0.30)

## 2013-04-08 LAB — COMPREHENSIVE METABOLIC PANEL
AST: 166 U/L — ABNORMAL HIGH (ref 0–37)
Albumin: 4.6 g/dL (ref 3.5–5.2)
Alkaline Phosphatase: 92 U/L (ref 39–117)
Chloride: 98 mEq/L (ref 96–112)
Potassium: 3.7 mEq/L (ref 3.5–5.1)
Sodium: 138 mEq/L (ref 135–145)
Total Bilirubin: 0.5 mg/dL (ref 0.3–1.2)
Total Protein: 8 g/dL (ref 6.0–8.3)

## 2013-04-08 LAB — LIPASE, BLOOD: Lipase: 43 U/L (ref 11–59)

## 2013-04-08 LAB — POCT I-STAT TROPONIN I

## 2013-04-08 MED ORDER — PANTOPRAZOLE SODIUM 40 MG IV SOLR
40.0000 mg | Freq: Once | INTRAVENOUS | Status: AC
  Administered 2013-04-08: 40 mg via INTRAVENOUS
  Filled 2013-04-08: qty 40

## 2013-04-08 MED ORDER — SODIUM CHLORIDE 0.9 % IV BOLUS (SEPSIS)
1000.0000 mL | Freq: Once | INTRAVENOUS | Status: AC
  Administered 2013-04-08: 1000 mL via INTRAVENOUS

## 2013-04-08 MED ORDER — ONDANSETRON 4 MG PO TBDP
8.0000 mg | ORAL_TABLET | Freq: Once | ORAL | Status: AC
  Administered 2013-04-08: 8 mg via ORAL
  Filled 2013-04-08: qty 2

## 2013-04-08 MED ORDER — ONDANSETRON HCL 4 MG/2ML IJ SOLN
4.0000 mg | Freq: Once | INTRAMUSCULAR | Status: AC
  Administered 2013-04-08: 4 mg via INTRAVENOUS
  Filled 2013-04-08: qty 2

## 2013-04-08 NOTE — ED Notes (Addendum)
Presents with nausea and emesis all day to day. Last emesis at 8 pm tonight. Denies blood in emesis, denies pain.  HR 120s-130s

## 2013-04-08 NOTE — ED Notes (Signed)
Care link called for level 2 code sepsis. EDP called

## 2013-04-08 NOTE — ED Provider Notes (Signed)
History     CSN: [id number]  Arrival date & time 04/08/13  2041   First MD Initiated Contact with Patient 04/08/13 2137      Chief Complaint  Patient presents with  . Emesis    (Consider location/radiation/quality/duration/timing/severity/associated sxs/prior treatment) HPI Comments: Patient with history of Huntington's disease presenting with 6 day history of nausea and vomiting inability to tolerate by mouth. States the vomiting 10-20 times daily. Nonbilious nonbloody. Denies any diarrhea. Denies any fever. Denies any blood in his stools. Denies any recent travel or antibiotic use. Denies any history of diabetes. Denies any sick contacts. Denies any chest pain or shortness of breath. Tachycardic in triage.  The history is provided by the patient.    Past Medical History  Diagnosis Date  . Allergy   . GERD (gastroesophageal reflux disease)   . Huntington chorea     previously followed at [facility]- Dr Boris Sharper [phone] [name])  . Chronic diarrhea     Past Surgical History  Procedure Laterality Date  . Tonsillectomy  1959    Family History  Problem Relation Age of Onset  . Cancer Father     Lung  . Alzheimer's disease Maternal Uncle   . Stroke Maternal Uncle     Alzheimer  . Alzheimer's disease Maternal Grandfather   . Huntington's disease Maternal Grandmother   . Huntington's disease Mother     History  Substance Use Topics  . Smoking status: Never Smoker   . Smokeless tobacco: Never Used     Comment: Single, separated from wife 2003. Lives with friend of family. Moved to [name] area from [place] spring 2010 to be closer to [facility] and caregiver  . Alcohol Use: No      Review of Systems  Constitutional: Positive for activity change, appetite change and fatigue. Negative for fever.  HENT: Negative for congestion and rhinorrhea.   Respiratory: Negative for cough, chest tightness and shortness of breath.   Cardiovascular: Negative for chest pain.   Gastrointestinal: Positive for nausea, vomiting and abdominal pain. Negative for diarrhea.  Genitourinary: Negative for dysuria and hematuria.  Skin: Negative for rash.  Neurological: Positive for weakness. Negative for dizziness and headaches.    Allergies  Codeine  Home Medications   Current Outpatient Rx  Name  Route  Sig  Dispense  Refill  . cetirizine (ZYRTEC) 10 MG tablet   Oral   Take 10 mg by mouth daily as needed for allergies.         . fexofenadine (ALLEGRA) 180 MG tablet   Oral   Take 180 mg by mouth daily as needed (for allergies).         Marland Kitchen omeprazole (PRILOSEC) 20 MG capsule   Oral   Take 20 mg by mouth daily.           BP 132/69  Pulse 106  Temp(Src) 98.5 F (36.9 C) (Oral)  Resp 18  Ht 6' (1.829 m)  Wt 190 lb (86.183 kg)  BMI 25.76 kg/m2  SpO2 100%  Physical Exam  Constitutional: He is oriented to person, place, and time. He appears well-developed and well-nourished. No distress.  HENT:  Head: Normocephalic and atraumatic.  Mouth/Throat: Oropharynx is clear and moist. No oropharyngeal exudate.  Dry mucous membranes  Eyes: Conjunctivae and EOM are normal. Pupils are equal, round, and reactive to light.  Neck: Normal range of motion. Neck supple.  Cardiovascular: Normal rate, regular rhythm and normal heart sounds.   No murmur heard. Pulmonary/Chest: Effort normal  and breath sounds normal. No respiratory distress.  Abdominal: Soft. There is no tenderness. There is no rebound and no guarding.  Soft and nontender  Musculoskeletal: Normal range of motion. He exhibits no edema and no tenderness.  Neurological: He is alert and oriented to person, place, and time. No cranial nerve deficit. He exhibits normal muscle tone. Coordination normal.  Involuntary movements  Skin: Skin is warm.    ED Course  Procedures (including critical care time)  Labs Reviewed  COMPREHENSIVE METABOLIC PANEL - Abnormal; Notable for the following:    Glucose, Bld  136 (*)    Creatinine, Ser 1.63 (*)    AST 166 (*)    ALT 70 (*)    GFR calc non Af Amer 44 (*)    GFR calc Af Amer 51 (*)    All other components within normal limits  URINALYSIS, ROUTINE W REFLEX MICROSCOPIC - Abnormal; Notable for the following:    Ketones, ur 40 (*)    All other components within normal limits  CG4 I-STAT (LACTIC ACID) - Abnormal; Notable for the following:    Lactic Acid, Venous 3.03 (*)    All other components within normal limits  CULTURE, BLOOD (ROUTINE X 2)  CULTURE, BLOOD (ROUTINE X 2)  CBC WITH DIFFERENTIAL  LIPASE, BLOOD  TROPONIN I  LACTIC ACID, PLASMA  POCT I-STAT TROPONIN I   US Abdomen Complete  04/09/2013   *RADIOLOGY REPORT*  Clinical Data:  Abnormal elevated liver function tests.  Nausea and vomiting.  COMPLETE ABDOMINAL ULTRASOUND  Comparison:  04/08/2013  Findings:  Gallbladder:  No gallstones, gallbladder wall thickening, or pericholecystic fluid.  Common bile duct:  Measures 2 mm in diameter, within normal limits.  Liver:  5.1 x 5.8 x 5.3 cm cyst in the right hepatic lobe has several thin internal septations but no obvious nodularity. Otherwise negative.  IVC:  Normal where visualized.  Pancreas:  Not seen due to overlying bowel gas.  Spleen:  3.0 cm craniocaudad, appears unremarkable.  Right Kidney:  9.5 cm in length, appears normal.  Left Kidney:  9.0 cm in length, appears normal.  Abdominal aorta:  No aneurysm identified.  IMPRESSION:  1.  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 No definite internal nodularity.  Given the elevated liver function tests, hepatic MRI with without contrast follow-up may be warranted to exclude the unlikely possibility of a cystic neoplastic disease or echinococcal cystic disease. 2.  Nonvisualization of the pancreas due to overlying bowel gas.   Original Report Authenticated By: Gaylyn Rong, M.D.   Dg Abd Acute W/chest  04/08/2013   *RADIOLOGY REPORT*  Clinical Data: Abdominal pain.  Nausea and emesis  ACUTE  ABDOMEN SERIES (ABDOMEN 2 VIEW & CHEST 1 VIEW)  Comparison: 11/11/2009  Findings: The heart size and mediastinal contours are within normal limits.  Both lungs are clear.  The visualized skeletal structures are unremarkable.  The bowel gas pattern is nonobstructed.  There are no dilated loops of small bowel or fluid levels identified.  No abnormal abdominal or pelvic calcifications identified.  IMPRESSION:  1.  Nonobstructive bowel gas pattern.   Original Report Authenticated By: Signa Kell, M.D.     1. Nausea and vomiting   2. Lactic acidosis       MDM  6 day history of nausea vomiting inability to tolerate by mouth. Patient with dry mucous membranes. Abdomen soft nontender. He is tachycardic.  Lactate is 3, creatinine is 1.6. No vomiting seen in ED. Anion gap 20.  Ketones in urine. Mild elevation of AST>ALT.  Patient denies alcohol use.  With ongoing symptoms and tachycardia will admit for hydration.  Will obtained US to evalate elevated LFTs. Lactate has cleared after recheck. HR improved from 130s to 100s.   Date: 04/08/2013  Rate: 128  Rhythm: sinus tachycardia  QRS Axis: normal  Intervals: normal  ST/T Wave abnormalities: normal  Conduction Disutrbances:none  Narrative Interpretation:   Old EKG Reviewed: none available        Glynn Octave, MD 04/09/13 0225

## 2013-04-08 NOTE — ED Notes (Signed)
Pt asked could he obtain a urine sample. Pt stated that he couldn't at this time. Will follow up shortly

## 2013-04-08 NOTE — ED Notes (Signed)
New EKG given to Dr Rancour 

## 2013-04-09 ENCOUNTER — Emergency Department (HOSPITAL_COMMUNITY): Payer: Medicare Other

## 2013-04-09 ENCOUNTER — Encounter (HOSPITAL_COMMUNITY): Payer: Self-pay | Admitting: Internal Medicine

## 2013-04-09 DIAGNOSIS — N179 Acute kidney failure, unspecified: Principal | ICD-10-CM

## 2013-04-09 DIAGNOSIS — E860 Dehydration: Secondary | ICD-10-CM

## 2013-04-09 DIAGNOSIS — K7689 Other specified diseases of liver: Secondary | ICD-10-CM | POA: Diagnosis present

## 2013-04-09 DIAGNOSIS — R112 Nausea with vomiting, unspecified: Secondary | ICD-10-CM

## 2013-04-09 LAB — HEPATITIS PANEL, ACUTE
HCV Ab: NEGATIVE
Hepatitis B Surface Ag: NEGATIVE

## 2013-04-09 LAB — TROPONIN I
Troponin I: <0.3 ng/mL (ref <0.30)
Troponin I: <0.3 ng/mL (ref <0.30)
Troponin I: <0.3 ng/mL (ref <0.30)

## 2013-04-09 LAB — MAGNESIUM: Magnesium: 2 mg/dL (ref 1.5–2.5)

## 2013-04-09 LAB — SODIUM, URINE, RANDOM: Sodium, Ur: 115 mEq/L

## 2013-04-09 LAB — HEMOGLOBIN A1C: Mean Plasma Glucose: 120 mg/dL — ABNORMAL HIGH (ref <117)

## 2013-04-09 LAB — CBC
Hemoglobin: 14 g/dL (ref 13.0–17.0)
MCHC: 34.6 g/dL (ref 30.0–36.0)
RDW: 12.5 % (ref 11.5–15.5)
WBC: 5.4 10*3/uL (ref 4.0–10.5)

## 2013-04-09 LAB — COMPREHENSIVE METABOLIC PANEL
ALT: 56 U/L — ABNORMAL HIGH (ref 0–53)
BUN: 11 mg/dL (ref 6–23)
CO2: 19 mEq/L (ref 19–32)
Calcium: 8.7 mg/dL (ref 8.4–10.5)
GFR calc Af Amer: 64 mL/min — ABNORMAL LOW (ref >90)
GFR calc non Af Amer: 55 mL/min — ABNORMAL LOW (ref >90)
Glucose, Bld: 99 mg/dL (ref 70–99)
Sodium: 140 mEq/L (ref 135–145)

## 2013-04-09 LAB — URINALYSIS, ROUTINE W REFLEX MICROSCOPIC
Glucose, UA: NEGATIVE mg/dL
Ketones, ur: 40 mg/dL — AB
Leukocytes, UA: NEGATIVE
Nitrite: NEGATIVE
Specific Gravity, Urine: 1.014 (ref 1.005–1.030)
pH: 6 (ref 5.0–8.0)

## 2013-04-09 LAB — LIPID PANEL
LDL Cholesterol: 123 mg/dL — ABNORMAL HIGH (ref 0–99)
VLDL: 11 mg/dL (ref 0–40)

## 2013-04-09 LAB — LACTIC ACID, PLASMA: Lactic Acid, Venous: 1.2 mmol/L (ref 0.5–2.2)

## 2013-04-09 LAB — CREATININE, URINE, RANDOM: Creatinine, Urine: 116.79 mg/dL

## 2013-04-09 MED ORDER — ONDANSETRON HCL 4 MG PO TABS: 4.0000 mg | ORAL_TABLET | Freq: Four times a day (QID) | ORAL | Status: DC | PRN

## 2013-04-09 MED ORDER — ONDANSETRON HCL 4 MG/2ML IJ SOLN: 4.0000 mg | Freq: Four times a day (QID) | INTRAMUSCULAR | Status: DC | PRN

## 2013-04-09 MED ORDER — BIOTENE DRY MOUTH MT LIQD
15.0000 mL | Freq: Two times a day (BID) | OROMUCOSAL | Status: DC
  Administered 2013-04-09 – 2013-04-10 (×3): 15 mL via OROMUCOSAL

## 2013-04-09 MED ORDER — SODIUM CHLORIDE 0.9 % IV BOLUS (SEPSIS)
1000.0000 mL | Freq: Once | INTRAVENOUS | Status: AC
  Administered 2013-04-09: 1000 mL via INTRAVENOUS

## 2013-04-09 MED ORDER — LORATADINE 10 MG PO TABS
10.0000 mg | ORAL_TABLET | Freq: Every day | ORAL | Status: DC
  Administered 2013-04-09 – 2013-04-10 (×2): 10 mg via ORAL
  Filled 2013-04-09 (×2): qty 1

## 2013-04-09 MED ORDER — MORPHINE SULFATE 2 MG/ML IJ SOLN: 2.0000 mg | INTRAMUSCULAR | Status: DC | PRN

## 2013-04-09 MED ORDER — PANTOPRAZOLE SODIUM 40 MG PO TBEC
40.0000 mg | DELAYED_RELEASE_TABLET | Freq: Every day | ORAL | Status: DC
  Administered 2013-04-09 – 2013-04-10 (×2): 40 mg via ORAL
  Filled 2013-04-09 (×2): qty 1

## 2013-04-09 MED ORDER — SODIUM CHLORIDE 0.9 % IV SOLN: INTRAVENOUS | Status: DC

## 2013-04-09 MED ORDER — ENOXAPARIN SODIUM 40 MG/0.4ML ~~LOC~~ SOLN
40.0000 mg | SUBCUTANEOUS | Status: DC
  Administered 2013-04-09 – 2013-04-10 (×2): 40 mg via SUBCUTANEOUS
  Filled 2013-04-09 (×2): qty 0.4

## 2013-04-09 MED ORDER — SODIUM CHLORIDE 0.9 % IJ SOLN
3.0000 mL | Freq: Two times a day (BID) | INTRAMUSCULAR | Status: DC
  Administered 2013-04-09 – 2013-04-10 (×3): 3 mL via INTRAVENOUS

## 2013-04-09 MED ORDER — SODIUM CHLORIDE 0.9 % IV BOLUS (SEPSIS)
1000.0000 mL | Freq: Once | INTRAVENOUS | Status: AC
  Administered 2013-04-09 (×2): 1000 mL via INTRAVENOUS

## 2013-04-09 NOTE — H&P (Signed)
PCP:  Rene Paci, MD    Chief Complaint:  Nausea and vomiting   HPI: Eric Waters is a 62 y.o. male   has a past medical history of Allergy; GERD (gastroesophageal reflux disease); Huntington chorea; and Chronic diarrhea.   Presented with  1 week hx of nausea and vomiting and 3-4 days of mild diarrhea. He has hx of this episodes in the past over the past 2 months.  Patient has no sick contacts. He reports some subjective fevers. Denies any travel hx. Reports city water in his house. Denies being an any recent antibiotics. Have not had any abdominal pain or weight loss. Patient was found to be  tachycardic initially with evidence of ARF and dehydration.  [place] was done given elevated LFT's and did not show any evidence of biliary disease but did show largeseptated liver cyst.   Review of Systems:    Pertinent positives include: Fevers, nausea, vomiting, diarrhea,   Constitutional:  No weight loss, night sweats, chills, fatigue, weight loss  HEENT:  No headaches, Difficulty swallowing,Tooth/dental problems,Sore throat,  No sneezing, itching, ear ache, nasal congestion, post nasal drip,  Cardio-vascular:  No chest pain, Orthopnea, PND, anasarca, dizziness, palpitations.no Bilateral lower extremity swelling  GI:  No heartburn, indigestion, abdominal pain, change in bowel habits, loss of appetite, melena, blood in stool, hematemesis Resp:  no shortness of breath at rest. No dyspnea on exertion, No excess mucus, no productive cough, No non-productive cough, No coughing up of blood.No change in color of mucus.No wheezing. Skin:  no rash or lesions. No jaundice GU:  no dysuria, change in color of urine, no urgency or frequency. No straining to urinate.  No flank pain.  Musculoskeletal:  No joint pain or no joint swelling. No decreased range of motion. No back pain.  Psych:  No change in mood or affect. No depression or anxiety. No memory loss.  Neuro: no localizing neurological  complaints, no tingling, no weakness, no double vision, no gait abnormality, no slurred speech, no confusion  Otherwise ROS are negative except for above, 10 systems were reviewed  Past Medical History: Past Medical History  Diagnosis Date  . Allergy   . GERD (gastroesophageal reflux disease)   . Huntington chorea     previously followed at [facility]- Dr Boris Sharper [phone] [name])  . Chronic diarrhea    Past Surgical History  Procedure Laterality Date  . Tonsillectomy  1959     Medications: Prior to Admission medications   Medication Sig Start Date End Date Taking? Authorizing Provider  cetirizine (ZYRTEC) 10 MG tablet Take 10 mg by mouth daily as needed for allergies.   Yes Historical Provider, MD  fexofenadine (ALLEGRA) 180 MG tablet Take 180 mg by mouth daily as needed (for allergies).   Yes Historical Provider, MD  omeprazole (PRILOSEC) 20 MG capsule Take 20 mg by mouth daily.   Yes Historical Provider, MD    Allergies:   Allergies  Allergen Reactions  . Codeine Hives and Rash    Social History:  Ambulatory  independently  Lives at  Home with friend   reports that he has never smoked. He has never used smokeless tobacco. He reports that he does not drink alcohol or use illicit drugs.   Family History: family history includes Alzheimer's disease in his maternal grandfather and maternal uncle; Cancer in his father; Huntington's disease in his maternal grandmother and mother; and Stroke in his maternal uncle.    Physical Exam: Patient Vitals for the past 24  hrs:  BP Temp Temp src Pulse Resp SpO2 Height Weight  04/09/13 0100 132/69 mmHg - - 106 - 100 % - -  04/08/13 2323 127/83 mmHg - - 104 18 100 % - -  04/08/13 2100 - - - - - - 6' (1.829 m) 86.183 kg (190 lb)  04/08/13 2053 150/105 mmHg 98.5 F (36.9 C) Oral 128 24 95 % - -    1. General:  in No Acute distress 2. Psychological: Alert and   Oriented 3. Head/ENT:    Dry Mucous Membranes                           Head Non traumatic, neck supple                          Normal   Dentition 4. SKIN:  decreased Skin turgor,  Skin clean Dry and intact no rash 5. Heart: Regular rate and rhythm no Murmur, Rub or gallop 6. Lungs: Clear to auscultation bilaterally, no wheezes or crackles   7. Abdomen: Soft, non-tender, Non distended 8. Lower extremities: no clubbing, cyanosis, or edema 9. Neurologically Grossly intact, moving all 4 extremities equally 10. MSK: Normal range of motion  body mass index is 25.76 kg/(m^2).   Labs on Admission:   Recent Labs  04/08/13 2123  NA 138  K 3.7  CL 98  CO2 20  GLUCOSE 136*  BUN 14  CREATININE 1.63*  CALCIUM 10.0    Recent Labs  04/08/13 2123  AST 166*  ALT 70*  ALKPHOS 92  BILITOT 0.5  PROT 8.0  ALBUMIN 4.6    Recent Labs  04/08/13 2123  LIPASE 43    Recent Labs  04/08/13 2123  WBC 5.0  NEUTROABS 3.8  HGB 15.4  HCT 43.8  MCV 84.2  PLT 281    Recent Labs  04/08/13 2210  TROPONINI <0.30   No results found for this basename: TSH, T4TOTAL, FREET3, T3FREE, THYROIDAB,  in the last 72 hours No results found for this basename: VITAMINB12, FOLATE, FERRITIN, TIBC, IRON, RETICCTPCT,  in the last 72 hours No results found for this basename: HGBA1C    Estimated Creatinine Clearance: 52.2 ml/min (by C-G formula based on Cr of 1.63). ABG No results found for this basename: phart, pco2, po2, hco3, tco2, acidbasedef, o2sat     No results found for this basename: DDIMER     Other results:  I have pearsonaly reviewed this: ECG REPORT  Rate:109  Rhythm: Sinus tachy ST&T Change: NO ischemic changes  UA no evidence of infection   Cultures:    Component Value Date/Time   SDES STOOL 05/20/2012 0913   SPECREQUEST Normal 05/20/2012 0913   CULT NO SALMONELLA, SHIGELLA, CAMPYLOBACTER, YERSINIA, OR E.COLI 0157:H7 ISOLATED 05/20/2012 0913   REPTSTATUS 05/24/2012 FINAL 05/20/2012 0913       Radiological Exams on Admission: US  Abdomen Complete  04/09/2013   *RADIOLOGY REPORT*  Clinical Data:  Abnormal elevated liver function tests.  Nausea and vomiting.  COMPLETE ABDOMINAL ULTRASOUND  Comparison:  04/08/2013  Findings:  Gallbladder:  No gallstones, gallbladder wall thickening, or pericholecystic fluid.  Common bile duct:  Measures 2 mm in diameter, within normal limits.  Liver:  5.1 x 5.8 x 5.3 cm cyst in the right hepatic lobe has several thin internal septations but no obvious nodularity. Otherwise negative.  IVC:  Normal where visualized.  Pancreas:  Not seen due  to overlying bowel gas.  Spleen:  3.0 cm craniocaudad, appears unremarkable.  Right Kidney:  9.5 cm in length, appears normal.  Left Kidney:  9.0 cm in length, appears normal.  Abdominal aorta:  No aneurysm identified.  IMPRESSION:  1.  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 No definite internal nodularity.  Given the elevated liver function tests, hepatic MRI with without contrast follow-up may be warranted to exclude the unlikely possibility of a cystic neoplastic disease or echinococcal cystic disease. 2.  Nonvisualization of the pancreas due to overlying bowel gas.   Original Report Authenticated By: Gaylyn Rong, M.D.   Dg Abd Acute W/chest  04/08/2013   *RADIOLOGY REPORT*  Clinical Data: Abdominal pain.  Nausea and emesis  ACUTE ABDOMEN SERIES (ABDOMEN 2 VIEW & CHEST 1 VIEW)  Comparison: 11/11/2009  Findings: The heart size and mediastinal contours are within normal limits.  Both lungs are clear.  The visualized skeletal structures are unremarkable.  The bowel gas pattern is nonobstructed.  There are no dilated loops of small bowel or fluid levels identified.  No abnormal abdominal or pelvic calcifications identified.  IMPRESSION:  1.  Nonobstructive bowel gas pattern.   Original Report Authenticated By: Signa Kell, M.D.    Chart has been reviewed  Assessment/Plan   62 yo male with history of Huntington's chorea here with nausea and vomiting a week  with evidence of dehydration acute renal failure. He had noted to have elevated LFTs an ultrasound done showed large cystic clear shown on his liver.  Present on Admission:  . Dehydration - we'll give IV fluids and monitor . Nausea with vomiting - at this point etiology is not clear could be viral gastroenteritis. Given elevated LFTs we'll also evaluate for  hepatitis although this is less likely. Patient also endorses mild diarrhea obtain stool cultures and stool studies . HUNTINGTONS CHOREA - patient would not be a good candidate for MRI due to inability to lay still. Marland Kitchen GERD - Protonix . Acute renal failure - likely second to dehydration will give IV fluids. Obtain urine electrolytes. Will hold off on CT scan with contrast until creatinine improves back to baseline. . Elevated LFTs - etiology unclear not answer biliary disease cystic lesion was noted. We'll obtain hepatitis panel. Follow up LFTs, evaluate for any evidence of fatty liver disease. At this point will hold off on a CT scan until creatinine stabilizes . Liver cyst -given elevated LFTsthis needs to be followed up further. Unfortunately due to history of Huntington's he's not going to be able to lay still for MRI. Will need to have CT of abdomenwith and without contrast done once his creatinine improves.   Prophylaxis: Lovenox, Protonix  CODE STATUS: FULL CODE  Other plan as per orders.  I have spent a total of 55 min on this admission  Edmund Holcomb 04/09/2013, 2:12 AM

## 2013-04-09 NOTE — Progress Notes (Signed)
Chart reviewed.  Patient examined.  Reviewed ultrasound with radiologist. CAT scan unlikely to add anything to ultrasound findings. Ultrasound has no definite concerns for neoplasm or infection. Radiologist recommends 6 month repeat ultrasound to evaluate stability. Patient has had no vomiting or diarrhea since admission. He is eating 100% of his liquid trays. Creatinine has improved. Will discontinue telemetry. Advance diet. Monitor. Possibly home tomorrow.

## 2013-04-10 DIAGNOSIS — K5289 Other specified noninfective gastroenteritis and colitis: Secondary | ICD-10-CM

## 2013-04-10 DIAGNOSIS — K7689 Other specified diseases of liver: Secondary | ICD-10-CM

## 2013-04-10 DIAGNOSIS — R7989 Other specified abnormal findings of blood chemistry: Secondary | ICD-10-CM

## 2013-04-10 DIAGNOSIS — G10 Huntington's disease: Secondary | ICD-10-CM

## 2013-04-10 LAB — COMPREHENSIVE METABOLIC PANEL
ALT: 53 U/L (ref 0–53)
AST: 104 U/L — ABNORMAL HIGH (ref 0–37)
Albumin: 3.3 g/dL — ABNORMAL LOW (ref 3.5–5.2)
Alkaline Phosphatase: 70 U/L (ref 39–117)
Chloride: 105 mEq/L (ref 96–112)
Potassium: 3.5 mEq/L (ref 3.5–5.1)
Total Bilirubin: 0.4 mg/dL (ref 0.3–1.2)

## 2013-04-10 NOTE — Progress Notes (Signed)
Utilization review completed.  

## 2013-04-10 NOTE — Discharge Summary (Signed)
Physician Discharge Summary  Eric Waters ZOX:[id number] DOB: 1951-04-01 DOA: 04/08/2013  PCP: Eric Paci, MD  Admit date: 04/08/2013 Discharge date: 04/10/2013  Recommendations for Outpatient Follow-up:  1. Repeat ultrasound of abdomen in 6 months to follow up liver cyst  Discharge Diagnoses:  Principal Problem:   Gastroenteritis Active Problems:   HUNTINGTONS CHOREA   Dehydration   Elevated LFTs   Liver cyst   CKD (chronic kidney disease), stage III  Discharge Condition: stable  Diet recommendation: lactose free  Filed Weights   04/08/13 2100 04/09/13 0400 04/09/13 2100  Weight: 86.183 kg (190 lb) 76.295 kg (168 lb 3.2 oz) 77.2 kg (170 lb 3.1 oz)    History of present illness:  [name] is a 62 year old black male Jehovah's Witness with Huntington's Chorea who presented with complaints of vomiting and diarrhea.  He had normal vital signs, but had signs of dehydration, azotemia and mild lactic acidosis.  He has not seen his PCP for over 2 years.  Hospital Course:  Patient was admitted for hydration and supportive care.  While in hospital, he had no vomiting or diarrhea, and his diet was quickly advanced.  He endorsed chronic diarrhea that is usually worsened by dairy and is manageable with OTC antidiarrheals.  Incidentally found was a liver cyst on US abdomen.  Could not be further delineated as patient would be unable to stay still in MRI scanner.  Discussed with radiology, who reported cyst looked benign and noninfectious, but recommended f/u US in 6 months.   Discharge Exam: Filed Vitals:   04/09/13 1742 04/09/13 2100 04/10/13 0000 04/10/13 0445  BP: 150/100 145/79 142/79 138/84  Pulse: 114 76 73 80  Temp: 98.2 F (36.8 C) 97.9 F (36.6 C) 97.7 F (36.5 C) 98.3 F (36.8 C)  TempSrc: Oral Oral Oral Oral  Resp: 17 20 18 19   Height:  6' (1.829 m)    Weight:  77.2 kg (170 lb 3.1 oz)    SpO2: 100% 98% 98% 99%    General: nontoxic, comfortable, eating Abd:   S,NT,ND  Discharge Instructions  Discharge Orders   Future Orders Complete By Expires     Activity as tolerated - No restrictions  As directed     Discharge instructions  As directed     Comments:      Avoid dairy products, except yogurt    Driving Restrictions  As directed     Comments:      No driving        Medication List    TAKE these medications       cetirizine 10 MG tablet  Commonly known as:  ZYRTEC  Take 10 mg by mouth daily as needed for allergies.     fexofenadine 180 MG tablet  Commonly known as:  ALLEGRA  Take 180 mg by mouth daily as needed (for allergies).     omeprazole 20 MG capsule  Commonly known as:  PRILOSEC  Take 20 mg by mouth daily.       Allergies  Allergen Reactions  . Codeine Hives and Rash      The results of significant diagnostics from this hospitalization (including imaging, microbiology, ancillary and laboratory) are listed below for reference.    Significant Diagnostic Studies: US Abdomen Complete  04/09/2013   *RADIOLOGY REPORT*  Clinical Data:  Abnormal elevated liver function tests.  Nausea and vomiting.  COMPLETE ABDOMINAL ULTRASOUND  Comparison:  04/08/2013  Findings:  Gallbladder:  No gallstones, gallbladder wall thickening, or  pericholecystic fluid.  Common bile duct:  Measures 2 mm in diameter, within normal limits.  Liver:  5.1 x 5.8 x 5.3 cm cyst in the right hepatic lobe has several thin internal septations but no obvious nodularity. Otherwise negative.  IVC:  Normal where visualized.  Pancreas:  Not seen due to overlying bowel gas.  Spleen:  3.0 cm craniocaudad, appears unremarkable.  Right Kidney:  9.5 cm in length, appears normal.  Left Kidney:  9.0 cm in length, appears normal.  Abdominal aorta:  No aneurysm identified.  IMPRESSION:  1.  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 No definite internal nodularity.  Given the elevated liver function tests, hepatic MRI with without contrast follow-up may be warranted to exclude  the unlikely possibility of a cystic neoplastic disease or echinococcal cystic disease. 2.  Nonvisualization of the pancreas due to overlying bowel gas.   Original Report Authenticated By: Gaylyn Rong, M.D.   Dg Abd Acute W/chest  04/08/2013   *RADIOLOGY REPORT*  Clinical Data: Abdominal pain.  Nausea and emesis  ACUTE ABDOMEN SERIES (ABDOMEN 2 VIEW & CHEST 1 VIEW)  Comparison: 11/11/2009  Findings: The heart size and mediastinal contours are within normal limits.  Both lungs are clear.  The visualized skeletal structures are unremarkable.  The bowel gas pattern is nonobstructed.  There are no dilated loops of small bowel or fluid levels identified.  No abnormal abdominal or pelvic calcifications identified.  IMPRESSION:  1.  Nonobstructive bowel gas pattern.   Original Report Authenticated By: Signa Kell, M.D.    Microbiology: Recent Results (from the past 240 hour(s))  CULTURE, BLOOD (ROUTINE X 2)     Status: None   Collection Time    04/08/13 10:08 PM      Result Value Range Status   Specimen Description BLOOD RIGHT ARM   Final   Special Requests BOTTLES DRAWN AEROBIC ONLY 9CC   Final   Culture  Setup Time 04/09/2013 13:07   Final   Culture     Final   Value:        BLOOD CULTURE RECEIVED NO GROWTH TO DATE CULTURE WILL BE HELD FOR 5 DAYS BEFORE ISSUING A FINAL NEGATIVE REPORT   Report Status PENDING   Incomplete  CULTURE, BLOOD (ROUTINE X 2)     Status: None   Collection Time    04/08/13 10:15 PM      Result Value Range Status   Specimen Description BLOOD LEFT ARM   Final   Special Requests BOTTLES DRAWN AEROBIC ONLY 5CC   Final   Culture  Setup Time 04/09/2013 13:07   Final   Culture     Final   Value:        BLOOD CULTURE RECEIVED NO GROWTH TO DATE CULTURE WILL BE HELD FOR 5 DAYS BEFORE ISSUING A FINAL NEGATIVE REPORT   Report Status PENDING   Incomplete  CLOSTRIDIUM DIFFICILE BY PCR     Status: None   Collection Time    04/10/13  2:22 AM      Result Value Range Status    C difficile by pcr NEGATIVE  NEGATIVE Final     Labs: Basic Metabolic Panel:  Recent Labs Lab 04/08/13 2123 04/09/13 0625 04/10/13 0545  NA 138 140 139  K 3.7 3.6 3.5  CL 98 105 105  CO2 20 19 22   GLUCOSE 136* 99 95  BUN 14 11 11   CREATININE 1.63* 1.35 1.45*  CALCIUM 10.0 8.7 9.0  MG  --  2.0  --   PHOS  --  2.7  --    Liver Function Tests:  Recent Labs Lab 04/08/13 2123 04/09/13 0625 04/10/13 0545  AST 166* 128* 104*  ALT 70* 56* 53  ALKPHOS 92 80 70  BILITOT 0.5 0.4 0.4  PROT 8.0 6.8 6.4  ALBUMIN 4.6 3.6 3.3*    Recent Labs Lab 04/08/13 2123  LIPASE 43   No results found for this basename: AMMONIA,  in the last 168 hours CBC:  Recent Labs Lab 04/08/13 2123 04/09/13 0625  WBC 5.0 5.4  NEUTROABS 3.8  --   HGB 15.4 14.0  HCT 43.8 40.5  MCV 84.2 85.4  PLT 281 206   Cardiac Enzymes:  Recent Labs Lab 04/08/13 2210 04/09/13 0500 04/09/13 1045 04/09/13 1634  TROPONINI <0.30 <0.30 <0.30 <0.30   BNP: BNP (last 3 results) No results found for this basename: PROBNP,  in the last 8760 hours CBG: No results found for this basename: GLUCAP,  in the last 168 hours     Signed:  Matraca Hunkins L  [facility] 04/10/2013, 10:56 AM

## 2013-04-10 NOTE — Progress Notes (Signed)
Patient evaluated for long-term disease management services with [facility] Care Management Program.  Met with patient at bedside. Does not appear to have any [facility] Care Management needs currently. However, left [facility] Care Management brochure at bedside for patient to call in the future.     Raiford Noble, RN,BSN, [facility] Liaison, [phone]

## 2013-04-10 NOTE — Progress Notes (Signed)
04/10/2013 patient d/c today. Patient caregiver Georgeann Oppenheim came to pick patient up at 1745. Patient was stable when left the hospital. [facility] RN.

## 2013-04-11 LAB — OVA AND PARASITE EXAMINATION

## 2013-04-14 LAB — STOOL CULTURE

## 2013-04-15 LAB — CULTURE, BLOOD (ROUTINE X 2): Culture: NO GROWTH

## 2013-09-11 ENCOUNTER — Ambulatory Visit (INDEPENDENT_AMBULATORY_CARE_PROVIDER_SITE_OTHER): Payer: Medicare Other | Admitting: Family Medicine

## 2013-09-11 ENCOUNTER — Encounter: Payer: Self-pay | Admitting: Family Medicine

## 2013-09-11 ENCOUNTER — Ambulatory Visit (INDEPENDENT_AMBULATORY_CARE_PROVIDER_SITE_OTHER)
Admission: RE | Admit: 2013-09-11 | Discharge: 2013-09-11 | Disposition: A | Discharge status: Home / Self Care | Payer: Medicare Other | Source: Ambulatory Visit | Attending: Family Medicine | Admitting: Family Medicine

## 2013-09-11 VITALS — BP 110/68 | HR 86 | Wt 159.0 lb

## 2013-09-11 DIAGNOSIS — M79641 Pain in right hand: Secondary | ICD-10-CM

## 2013-09-11 DIAGNOSIS — M79609 Pain in unspecified limb: Secondary | ICD-10-CM

## 2013-09-11 DIAGNOSIS — S62309A Unspecified fracture of unspecified metacarpal bone, initial encounter for closed fracture: Secondary | ICD-10-CM

## 2013-09-11 MED ORDER — KETOROLAC TROMETHAMINE 60 MG/2ML IM SOLN
60.0000 mg | Freq: Once | INTRAMUSCULAR | Status: AC
  Administered 2013-09-11: 60 mg via INTRAMUSCULAR

## 2013-09-11 MED ORDER — HYDROCODONE-ACETAMINOPHEN 5-325 MG PO TABS: 1.0000 | ORAL_TABLET | Freq: Three times a day (TID) | ORAL | Status: DC | PRN

## 2013-09-11 NOTE — Assessment & Plan Note (Signed)
Right hip fracture. With patience chorea movements there is an increase concern that this could cause further displacement and angulation. Patient was put in a customized form fitted splint was applied by me today. This was left relatively loose secondary to the amount of swelling. Patient was given some palmar directed pressure to try to hold metacarpals in place. Patient as well as caregiver was given instructions of care and how to potentially tightness with swelling resolves. Patient given some Norco. Discuss potential side effects. Patient may use of ibuprofen as necessary as well but due to secondary history of chronic kidney disease we'll monitor closely Discuss continuing the splint until I see him again in 48 hours. At that time would like to rex-ray to make sure we do not have any further displacement. As long as patient can tolerate the pain as well as we do not see further displacement I would continue with conservative therapy.

## 2013-09-11 NOTE — Progress Notes (Signed)
  I'm seeing this patient by the request  of:    CC: hand pain  HPI: 62 yo gentleman past medical history significant for Huntington's chorea coming in with hand pain. Patient fell this morning and attempted to catch himself with his right hand. Patient had pain immediately and then started having discoloration and swelling of the hand and wrist. Patient did not have any injury before. Patient states that he can still to his fingers. Has any pain to palpation. Patient rates the pain 10 out of 10.   Past medical, surgical, family and social history reviewed. Medications reviewed all in the electronic medical record.   Review of Systems: No headache, visual changes, nausea, vomiting, diarrhea, constipation, dizziness, abdominal pain, skin rash, fevers, chills, night sweats, weight loss, swollen lymph nodes, body aches, joint swelling, muscle aches, chest pain, shortness of breath, mood changes.   Objective:    Blood pressure 110/68, pulse 86, weight 159 lb (72.122 kg).   General: The patient does have chorea movements are multiple limbs including neck at all times. The masked face appreciated. HEENT: Pupils equal, extraocular movements intact Respiratory: Patient's speak in full sentences and does not appear short of breath Cardiovascular: No lower extremity edema, non tender, no erythema Skin: Warm dry intact with no signs of infection or rash on extremities or on axial skeleton. Abdomen: Soft nontender Neuro: Cranial nerves II through XII are intact, neurovascularly intact in all extremities  Lymph: No lymphadenopathy of posterior or anterior cervical chain or axillae bilaterally.  Gait patient's gait and balance his abnormal secondary to chorea movements MSK: Non tender with full range of motion and good stability and symmetric strength and tone of shoulders, elbows, wrist, hip, knee and ankles bilaterally. Patient does have cogwheeling of all joints and some mild rigidity. Wrist:  Right Inspection shows swelling and discoloration of the distal wrist and hand mostly on the dorsal aspect. Patient does keep him in an adductor  State. ROM does seem to be somewhat rigid secondary to the chorea but also significantly tender with ulnar and radial deviation Palpation shows disc diffuse tenderness over dorsal hand and distal radial  this is also the area of most swelling positive snuffbox tenderness. No tenderness over Canal of [place]. Strength unable to check secondary to pain  X-rays were ordered reviewed and interpreted by me today. Complete x-rays of the patient's right hand and wrist show patient does have minimally displaced proximal oblique fractures of the second and third metacarpal extra-articular. This is displaced dorsally. No significant angulation or malrotation. Carpal bones unremarkable.    Impression and Recommendations:     This case required medical decision making of moderate complexity.

## 2013-09-11 NOTE — Patient Instructions (Addendum)
Very nice to meet you You do have a metacarpal fracture of 2nd and 3rd. Norco up to 3 times a day for pain Can add 600mg  of ibuprofen if needed Leave brace on Come back in 48 hours to make sure nothing moves.    Metacarpal Fracture   The metacarpal bones are in the middle of the hand, connecting the fingers to the wrist. A metacarpal fracture is a break in one of these bones. It is common for an injury of the hand to break one or more of these bones. A metacarpal fracture of the fifth (little) finger, near the knuckle, is also known as a boxer's fracture. SYMPTOMS   Severe pain at the time of injury.  Pain, tenderness, swelling (especially the back of the hand).  Bruising of the hand within 48 hours.  Visible deformity, if the fracture out of alignment (displaced).  Numbness or paralysis from swelling in the hand, causing pressure on the blood vessels or nerves (uncommon). CAUSES   Direct hit (trauma) to the hand, such as a striking blow with the fist.  Indirect stress to the hand, such as twisting or violent muscle contraction (uncommon). RISK INCREASES WITH:  Contact sports (football, rugby, soccer).  Sports that require hitting (boxing, martial arts).  History of bone or joint disease, including osteoporosis.  Poor hand strength and flexibility. PREVENTION  Maintain proper conditioning:  Hand and finger strength.  Flexibility and endurance.  For contact sports, wear properly fitted and padded protective equipment for the hand.  Learn and use proper technique when hitting, punching, and landing from a fall. PROGNOSIS If treated properly, metacarpal fractures can be expected to heal within 4 to 6 weeks. For severe injuries, surgery may be needed. RELATED COMPLICATIONS   Fracture does not heal (nonunion).  Heals in a poor position, including twisted fingers (malunion).  Chronic pain, stiffness, or swelling of the hand.  Excessive bleeding in the hand, causing  pressure and injury to nerves and blood vessels (rare).  Unstable or arthritic joint, following repeated injury or delayed treatment.  Hindrance of normal hand growth in children.  Infection in open fractures (skin broken over fracture) or at the incision or pin sites, if surgery was performed.  Shortening or injured bones.  Bony bump (spur) or loss of shape of the knuckles. TREATMENT  Treatment will vary, depending on the extent of the injury. First, ice and medicine will help reduce pain and inflammation. For a single metacarpal fracture that is not displaced and does not involve the joint, restraint is usually sufficient for healing to occur. Multiple metacarpal fractures, fractures that are displaced, or fractures involving the joint may require surgery. Surgery often involves placing pins and screws in the bones, to hold them in place. Restraint of the injury follows surgery, to allow for healing. After restraint (with or without surgery), stretching and strengthening exercises may be needed to regain strength and a full range of motion. Exercises may be done at home or with a therapist. Sometimes, depending on the sport and position, a brace or splint may be needed when first returning to sports. MEDICATION   Do not take pain medicine for 7 days before surgery.  Only take over-the-counter or prescription medicines for pain, fever, or discomfort as directed by your caregiver.  Prescription pain medicines are usually prescribed only after surgery. Use only as directed and only as much as you need. COLD THERAPY  Cold treatment (icing) should be applied for 10 to 15 minutes every 2  to 3 hours for inflammation and pain, and immediately after activity that aggravates your symptoms. Use ice packs or an ice massage. SEEK IMMEDIATE MEDICAL CARE IF:   Pain, tenderness, or swelling gets worse even with treatment.  You have pain, numbness, or coldness in the hand.  Blue, gray, or dark color  appears in the fingernails.  Any of the following occur after surgery:  You have an oral temperature above 102 F (38.9 C), not controlled by medicine.  You have increased pain, swelling, redness, drainage of fluids, or bleeding in the affected area.  New, unexplained symptoms develop. (Drugs used in treatment may produce side effects.) Document Released: 11/23/1998 Document Revised: 02/01/2012 Document Reviewed: 02/21/2009 [facility] Patient Information 2014 [place], [place].

## 2013-09-13 ENCOUNTER — Ambulatory Visit (INDEPENDENT_AMBULATORY_CARE_PROVIDER_SITE_OTHER)
Admission: RE | Admit: 2013-09-13 | Discharge: 2013-09-13 | Disposition: A | Discharge status: Home / Self Care | Payer: Medicare Other | Source: Ambulatory Visit | Attending: Family Medicine | Admitting: Family Medicine

## 2013-09-13 ENCOUNTER — Ambulatory Visit (INDEPENDENT_AMBULATORY_CARE_PROVIDER_SITE_OTHER): Payer: Medicare Other | Admitting: Family Medicine

## 2013-09-13 ENCOUNTER — Encounter: Payer: Self-pay | Admitting: Family Medicine

## 2013-09-13 VITALS — BP 138/86 | HR 76 | Wt 159.0 lb

## 2013-09-13 DIAGNOSIS — S62309A Unspecified fracture of unspecified metacarpal bone, initial encounter for closed fracture: Secondary | ICD-10-CM

## 2013-09-13 DIAGNOSIS — M79641 Pain in right hand: Secondary | ICD-10-CM

## 2013-09-13 DIAGNOSIS — M79609 Pain in unspecified limb: Secondary | ICD-10-CM

## 2013-09-13 DIAGNOSIS — Z9181 History of falling: Secondary | ICD-10-CM

## 2013-09-13 DIAGNOSIS — G10 Huntington's disease: Secondary | ICD-10-CM

## 2013-09-13 NOTE — Progress Notes (Signed)
CC: hand pain, followup second and third the carpal fractures  HPI: 62 yo gentleman past medical history significant for Huntington's chorea coming in for followup of his second and third metacarpal fractures. Patient was put in a EXOS brace and discussed conservative therapy. Patient still had some swelling at the time and was told to come back in 48 hours for recheck. Patient states  Past medical, surgical, family and social history reviewed. Medications reviewed all in the electronic medical record.   Review of Systems: No headache, visual changes, nausea, vomiting, diarrhea, constipation, dizziness, abdominal pain, skin rash, fevers, chills, night sweats, weight loss, swollen lymph nodes, body aches, joint swelling, muscle aches, chest pain, shortness of breath, mood changes.   Objective:    There were no vitals taken for this visit.   General: The patient does have chorea movements are multiple limbs including neck at all times. The masked face appreciated. HEENT: Pupils equal, extraocular movements intact Respiratory: Patient's speak in full sentences and does not appear short of breath Cardiovascular: No lower extremity edema, non tender, no erythema Skin: Warm dry intact with no signs of infection or rash on extremities or on axial skeleton. Abdomen: Soft nontender Neuro: Cranial nerves II through XII are intact, neurovascularly intact in all extremities  Lymph: No lymphadenopathy of posterior or anterior cervical chain or axillae bilaterally.  Gait patient's gait and balance his abnormal secondary to chorea movements MSK: Non tender with full range of motion and good stability and symmetric strength and tone of shoulders, elbows, wrist, hip, knee and ankles bilaterally. Patient does have cogwheeling of all joints and rigidity. Wrist: Right Inspection shows decreased swelling and discoloration of the distal wrist and hand compared to previous exam. Patient does keep thumb in an  adductor state. ROM does seem to be somewhat rigid secondary to the chorea but also significantly tender with ulnar and radial deviation Palpation shows disc diffuse tenderness over dorsal hand and distal radial  this is also the area of most swelling positive snuffbox tenderness. No tenderness over Canal of Guyon. Strength unable to check secondary to pain Neurovascularly intact distally.     Impression and Recommendations:     This case required medical decision making of moderate complexity.

## 2013-09-13 NOTE — Addendum Note (Signed)
Addended by: Edwena Felty T on: 09/13/2013 09:07 AM   Modules accepted: Orders

## 2013-09-13 NOTE — Assessment & Plan Note (Signed)
Discussed with patient and caregiver at length. With the moderate amount of displacement dorsally potential referral to a hand surgeon could be recommended. 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 We will continue with conservative therapy. Patient will continue to wear the brace. Patient will come back in 2 weeks. At that time he will get x-rays to make sure that we are having proper healing. Discussed icing protocol. Once again follow up in 2 weeks.

## 2013-09-13 NOTE — Addendum Note (Signed)
Addended by: Edwena Felty T on: 09/13/2013 04:55 PM   Modules accepted: Orders

## 2013-09-26 ENCOUNTER — Encounter: Payer: Self-pay | Admitting: Family Medicine

## 2013-09-26 ENCOUNTER — Ambulatory Visit (INDEPENDENT_AMBULATORY_CARE_PROVIDER_SITE_OTHER)
Admission: RE | Admit: 2013-09-26 | Discharge: 2013-09-26 | Disposition: A | Discharge status: Home / Self Care | Payer: Medicare Other | Source: Ambulatory Visit | Attending: Family Medicine | Admitting: Family Medicine

## 2013-09-26 ENCOUNTER — Ambulatory Visit: Payer: Self-pay | Admitting: Internal Medicine

## 2013-09-26 ENCOUNTER — Ambulatory Visit (INDEPENDENT_AMBULATORY_CARE_PROVIDER_SITE_OTHER): Payer: Self-pay | Admitting: Family Medicine

## 2013-09-26 VITALS — BP 140/92 | HR 91 | Ht 72.0 in | Wt 155.5 lb

## 2013-09-26 DIAGNOSIS — S62309A Unspecified fracture of unspecified metacarpal bone, initial encounter for closed fracture: Secondary | ICD-10-CM

## 2013-09-26 NOTE — Progress Notes (Signed)
Pre-visit discussion using our clinic review tool. No additional management support is needed unless otherwise documented below in the visit note.  

## 2013-09-26 NOTE — Assessment & Plan Note (Signed)
The patient seems to be doing very well at this time. I would like another x-ray to make sure there is some callus formation. Patient will do this and come back again in 4 weeks. Patient will wear the brace daily for the next 2 weeks and then come out of it at home. As long as he continues to do well he can follow up in 4 weeks or on an as-needed basis.

## 2013-09-26 NOTE — Patient Instructions (Signed)
Good to see you Get xray downstairs I would still wear brace when out of the house the next 2 weeks Can come out of the brace at home to wash and sleep if you want.  See you in 4 weeks.

## 2013-09-26 NOTE — Progress Notes (Signed)
CC: hand pain, followup second and third the carpal fractures  HPI: 62 yo gentleman past medical history significant for Huntington's chorea coming in for followup of his second and third metacarpal fractures. Patient was put in a EXOS brace and patient states that he is doing very well. Patient states he is not having significant pain and has not taken anything for pain medication anymore. Patient has not come out of the brace. Patient denies any new symptoms such as numbness or weakness in the extremity. Once again patient does have a past medical history significant for Huntington's chorea. He is not accompanied by his brother who is usually his primary caregiver.   Past medical, surgical, family and social history reviewed. Medications reviewed all in the electronic medical record.   Review of Systems: No headache, visual changes, nausea, vomiting, diarrhea, constipation, dizziness, abdominal pain, skin rash, fevers, chills, night sweats, weight loss, swollen lymph nodes, body aches, joint swelling, muscle aches, chest pain, shortness of breath, mood changes.   Objective:    Blood pressure 140/92, pulse 91, height 6' (1.829 m), weight 155 lb 8 oz (70.534 kg), SpO2 97.00%.   General: The patient does have chorea movements are multiple limbs including neck at all times. The masked face appreciated. HEENT: Pupils equal, extraocular movements intact Respiratory: Patient's speak in full sentences and does not appear short of breath Cardiovascular: No lower extremity edema, non tender, no erythema Skin: Warm dry intact with no signs of infection or rash on extremities or on axial skeleton. Abdomen: Soft nontender Neuro: Cranial nerves II through XII are intact, neurovascularly intact in all extremities  Lymph: No lymphadenopathy of posterior or anterior cervical chain or axillae bilaterally.  Gait patient's gait and balance his abnormal secondary to chorea movements MSK: Non tender with full  range of motion and good stability and symmetric strength and tone of shoulders, elbows, wrist, hip, knee and ankles bilaterally. Patient does have cogwheeling of all joints and rigidity. Wrist: Right Inspection shows decreased swelling and no discoloration of the distal wrist and hand. Patient does keep thumb in an adductor state. ROM does seem to be somewhat rigid secondary to the chorea but also significantly tender with ulnar and radial deviation Palpation shows disc diffuse tenderness over the second and third metatarsal but significantly improved. Patient has decreased swelling to No tenderness over Canal of Guyon. Strength unable to check secondary to pain Neurovascularly intact distally.     Impression and Recommendations:     This case required medical decision making of moderate complexity.

## 2013-10-16 ENCOUNTER — Encounter: Payer: Self-pay | Admitting: Internal Medicine

## 2013-10-16 ENCOUNTER — Ambulatory Visit (INDEPENDENT_AMBULATORY_CARE_PROVIDER_SITE_OTHER): Payer: Medicare Other | Admitting: Internal Medicine

## 2013-10-16 VITALS — BP 110/72 | HR 117 | Temp 98.2°F | Ht 72.0 in | Wt 147.8 lb

## 2013-10-16 DIAGNOSIS — F339 Major depressive disorder, recurrent, unspecified: Secondary | ICD-10-CM

## 2013-10-16 DIAGNOSIS — Z23 Encounter for immunization: Secondary | ICD-10-CM

## 2013-10-16 DIAGNOSIS — Z0000 Encounter for general adult medical examination without abnormal findings: Secondary | ICD-10-CM

## 2013-10-16 DIAGNOSIS — G10 Huntington's disease: Secondary | ICD-10-CM

## 2013-10-16 DIAGNOSIS — K7689 Other specified diseases of liver: Secondary | ICD-10-CM

## 2013-10-16 NOTE — Progress Notes (Signed)
Pre-visit discussion using our clinic review tool. No additional management support is needed unless otherwise documented below in the visit note.  

## 2013-10-16 NOTE — Progress Notes (Signed)
Subjective:    Patient ID: Eric Waters, male    DOB: 1951/01/28, 62 y.o.   MRN: [id number]  HPI  here with "caregiver" friend today  Here for medicare wellness  Diet: heart healthy Physical activity: as tolerated Depression/mood screen: negative Hearing: intact to whispered voice Visual acuity: grossly normal, performs annual eye exam  ADLs: capable Fall risk: reviewed Home safety: good Cognitive evaluation: intact to orientation, naming, recall and repetition EOL planning: adv directives, full code/ I agree  I have personally reviewed and have noted 1. The patient's medical and social history 2. Their use of alcohol, tobacco or illicit drugs 3. Their current medications and supplements 4. The patient's functional ability including ADL's, fall risks, home safety risks and hearing or visual impairment. 5. Diet and physical activities 6. Evidence for depression or mood disorders  Also reviewed chronic medical issues interval medical events: hand fracture, hospitalization for gastroenteritis may 2014, huntington's dz -   Past Medical History  Diagnosis Date  . Allergy   . GERD (gastroesophageal reflux disease)   . Huntington chorea     previously followed at [facility]- Dr Boris Sharper [phone] [name])  . Chronic diarrhea    Family History  Problem Relation Age of Onset  . Cancer Father     Lung  . Alzheimer's disease Maternal Uncle   . Stroke Maternal Uncle     Alzheimer  . Alzheimer's disease Maternal Grandfather   . Huntington's disease Maternal Grandmother   . Huntington's disease Mother    History  Substance Use Topics  . Smoking status: Never Smoker   . Smokeless tobacco: Never Used     Comment: Single, separated from wife 2003. Lives with friend of family. Moved to [name] area from [place] spring 2010 to be closer to [facility] and caregiver  . Alcohol Use: No    Review of Systems  Constitutional: Positive for fatigue and unexpected weight change (loss over past 2  years).  Eyes: Negative for visual disturbance.  Respiratory: Negative for cough, choking and shortness of breath.   Cardiovascular: Negative for chest pain and leg swelling.  Gastrointestinal: Negative for nausea, abdominal pain and diarrhea.  Musculoskeletal: Positive for gait problem (chronic related to neuro dz). Negative for back pain.  Neurological: Negative for dizziness and headaches.  Psychiatric/Behavioral: Negative for confusion, dysphoric mood and decreased concentration. The patient is not nervous/anxious and is not hyperactive.   All other systems reviewed and are negative.       Objective:   Physical Exam  Constitutional: He is oriented to person, place, and time. He appears well-developed and well-nourished.  Caregiver at side  HENT:  Head: Normocephalic and atraumatic.  Eyes: Conjunctivae are normal. Pupils are equal, round, and reactive to light.  Neck: Normal range of motion. Neck supple. No tracheal deviation present. No thyromegaly present.  Cardiovascular: Normal rate, regular rhythm and normal heart sounds.   No murmur heard. Pulmonary/Chest: Effort normal and breath sounds normal. No respiratory distress.  Abdominal: Soft. Bowel sounds are normal. He exhibits no distension. There is no tenderness.  Musculoskeletal:  R hand in splint for recent fx - no gross deformity; neurovascularly intact distally  Neurological: He is alert and oriented to person, place, and time.  Continuous chorea motion, esp RUE and head/neck  Skin: Skin is warm and dry. No rash noted. No erythema.  Psychiatric: He has a normal mood and affect. His behavior is normal. Judgment and thought content normal.   Lab Results  Component Value Date  WBC 5.4 04/09/2013   HGB 14.0 04/09/2013   HCT 40.5 04/09/2013   PLT 206 04/09/2013   CHOL 221* 04/09/2013   TRIG 54 04/09/2013   HDL 87 04/09/2013   ALT 53 8/65/7846   AST 104* 04/10/2013   NA 139 04/10/2013   K 3.5 04/10/2013   CL 105 04/10/2013    CREATININE 1.45* 04/10/2013   BUN 11 04/10/2013   CO2 22 04/10/2013   TSH 0.875 04/09/2013   HGBA1C 5.8* 04/09/2013       Assessment & Plan:   AWV/v70.0 - Today patient counseled on age appropriate routine health concerns for screening and prevention, each reviewed and up to date or declined. Immunizations reviewed and up to date or declined. Labs reviewed. Risk factors for depression reviewed and negative. Hearing function and visual acuity are intact. ADLs screened and addressed as needed. Functional ability and level of safety reviewed and appropriate. Education, counseling and referrals performed based on assessed risks today. Patient provided with a copy of personalized plan for preventive services.  Also See problem list. Medications and labs reviewed today.

## 2013-10-16 NOTE — Assessment & Plan Note (Signed)
[facility] same Eric Waters) - requests local physician -  Will refer to [name] now for same - Caregiver aware and understands same - Reports neuro symptoms stable

## 2013-10-16 NOTE — Patient Instructions (Addendum)
It was good to see you today.  Health Maintenance reviewed - all recommended immunizations and age-appropriate screenings are up-to-date.  Your annual flu shot was given and/or updated today.  Medications reviewed and updated, no changes recommended at this time.  we'll make referral to Dr. Arbutus Leas for local neurologist evaluation. Our office will contact you regarding appointment(s) once made.  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 Our office will contact you regarding appointment(s) once made. Your results will be released to MyChart (or called to you) after review, usually within 72hours after test completion. If any changes need to be made, you will be notified at that same time.  Please schedule followup in 12 months for annual visit/labs, call sooner if problems.  Health Maintenance, Males A healthy lifestyle and preventative care can promote health and wellness.  Maintain regular health, dental, and eye exams.  Eat a healthy diet. Foods like vegetables, fruits, whole grains, low-fat dairy products, and lean protein foods contain the nutrients you need without too many calories. Decrease your intake of foods high in solid fats, added sugars, and salt. Get information about a proper diet from your caregiver, if necessary.  Regular physical exercise is one of the most important things you can do for your health. Most adults should get at least 150 minutes of moderate-intensity exercise (any activity that increases your heart rate and causes you to sweat) each week. In addition, most adults need muscle-strengthening exercises on 2 or more days a week.   Maintain a healthy weight. The body mass index (BMI) is a screening tool to identify possible weight problems. It provides an estimate of body fat based on height and weight. Your caregiver can help determine your BMI, and can help you achieve or maintain a healthy weight. For adults 20 years  and older:  A BMI below 18.5 is considered underweight.  A BMI of 18.5 to 24.9 is normal.  A BMI of 25 to 29.9 is considered overweight.  A BMI of 30 and above is considered obese.  Maintain normal blood lipids and cholesterol by exercising and minimizing your intake of saturated fat. Eat a balanced diet with plenty of fruits and vegetables. Blood tests for lipids and cholesterol should begin at age 78 and be repeated every 5 years. If your lipid or cholesterol levels are high, you are over 50, or you are a high risk for heart disease, you may need your cholesterol levels checked more frequently.Ongoing high lipid and cholesterol levels should be treated with medicines, if diet and exercise are not effective.  If you smoke, find out from your caregiver how to quit. If you do not use tobacco, do not start.  Lung cancer screening is recommended for adults aged 33 80 years who are at high risk for developing lung cancer because of a history of smoking. Yearly low-dose computed tomography (CT) is recommended for people who have at least a 30-pack-year history of smoking and are a current smoker or have quit within the past 15 years. A pack year of smoking is smoking an average of 1 pack of cigarettes a day for 1 year (for example: 1 pack a day for 30 years or 2 packs a day for 15 years). Yearly screening should continue until the smoker has stopped smoking for at least 15 years. Yearly screening should also be stopped for people who develop a health problem that would prevent them from having lung cancer treatment.  If you choose to drink alcohol, do not exceed 2 drinks per day. One drink is considered to be 12 ounces (355 mL) of beer, 5 ounces (148 mL) of wine, or 1.5 ounces (44 mL) of liquor.  Avoid use of street drugs. Do not share needles with anyone. Ask for help if you need support or instructions about stopping the use of drugs.  High blood pressure causes heart disease and increases the risk  of stroke. Blood pressure should be checked at least every 1 to 2 years. Ongoing high blood pressure should be treated with medicines if weight loss and exercise are not effective.  If you are 68 to 62 years old, ask your caregiver if you should take aspirin to prevent heart disease.  Diabetes screening involves taking a blood sample to check your fasting blood sugar level. This should be done once every 3 years, after age 71, if you are within normal weight and without risk factors for diabetes. Testing should be considered at a younger age or be carried out more frequently if you are overweight and have at least 1 risk factor for diabetes.  Colorectal cancer can be detected and often prevented. Most routine colorectal cancer screening begins at the age of 45 and continues through age 17. However, your caregiver may recommend screening at an earlier age if you have risk factors for colon cancer. On a yearly basis, your caregiver may provide home test kits to check for hidden blood in the stool. Use of a small camera at the end of a tube, to directly examine the colon (sigmoidoscopy or colonoscopy), can detect the earliest forms of colorectal cancer. Talk to your caregiver about this at age 3, when routine screening begins. Direct examination of the colon should be repeated every 5 to 10 years through age 14, unless early forms of pre-cancerous polyps or small growths are found.  Hepatitis C blood testing is recommended for all people born from [birth year] through 1965 and any individual with known risks for hepatitis C.  Healthy men should no longer receive prostate-specific antigen (PSA) blood tests as part of routine cancer screening. Consult with your caregiver about prostate cancer screening.  Testicular cancer screening is not recommended for adolescents or adult males who have no symptoms. Screening includes self-exam, caregiver exam, and other screening tests. Consult with your caregiver about any  symptoms you have or any concerns you have about testicular cancer.  Practice safe sex. Use condoms and avoid high-risk sexual practices to reduce the spread of sexually transmitted infections (STIs).  Use sunscreen. Apply sunscreen liberally and repeatedly throughout the day. You should seek shade when your shadow is shorter than you. Protect yourself by wearing long sleeves, pants, a wide-brimmed hat, and sunglasses year round, whenever you are outdoors.  Notify your caregiver of new moles or changes in moles, especially if there is a change in shape or color. Also notify your caregiver if a mole is larger than the size of a pencil eraser.  A one-time screening for abdominal aortic aneurysm (AAA) and surgical repair of large AAAs by sound wave imaging (ultrasonography) is recommended for ages 67 to 29 years who are current or former smokers.  Stay current with your immunizations. Document Released: 05/07/2008 Document Revised: 03/06/2013 Document Reviewed: 04/06/2011 [facility] Patient Information 2014 [place], [place]. Health Maintenance, Males A healthy lifestyle and preventative care can promote health and wellness.  Maintain regular health, dental, and eye exams.  Eat a healthy diet. Foods like vegetables, fruits, whole  grains, low-fat dairy products, and lean protein foods contain the nutrients you need without too many calories. Decrease your intake of foods high in solid fats, added sugars, and salt. Get information about a proper diet from your caregiver, if necessary.  Regular physical exercise is one of the most important things you can do for your health. Most adults should get at least 150 minutes of moderate-intensity exercise (any activity that increases your heart rate and causes you to sweat) each week. In addition, most adults need muscle-strengthening exercises on 2 or more days a week.   Maintain a healthy weight. The body mass index (BMI) is a screening tool to identify  possible weight problems. It provides an estimate of body fat based on height and weight. Your caregiver can help determine your BMI, and can help you achieve or maintain a healthy weight. For adults 20 years and older:  A BMI below 18.5 is considered underweight.  A BMI of 18.5 to 24.9 is normal.  A BMI of 25 to 29.9 is considered overweight.  A BMI of 30 and above is considered obese.  Maintain normal blood lipids and cholesterol by exercising and minimizing your intake of saturated fat. Eat a balanced diet with plenty of fruits and vegetables. Blood tests for lipids and cholesterol should begin at age 29 and be repeated every 5 years. If your lipid or cholesterol levels are high, you are over 50, or you are a high risk for heart disease, you may need your cholesterol levels checked more frequently.Ongoing high lipid and cholesterol levels should be treated with medicines, if diet and exercise are not effective.  If you smoke, find out from your caregiver how to quit. If you do not use tobacco, do not start.  Lung cancer screening is recommended for adults aged 62 80 years who are at high risk for developing lung cancer because of a history of smoking. Yearly low-dose computed tomography (CT) is recommended for people who have at least a 30-pack-year history of smoking and are a current smoker or have quit within the past 15 years. A pack year of smoking is smoking an average of 1 pack of cigarettes a day for 1 year (for example: 1 pack a day for 30 years or 2 packs a day for 15 years). Yearly screening should continue until the smoker has stopped smoking for at least 15 years. Yearly screening should also be stopped for people who develop a health problem that would prevent them from having lung cancer treatment.  If you choose to drink alcohol, do not exceed 2 drinks per day. One drink is considered to be 12 ounces (355 mL) of beer, 5 ounces (148 mL) of wine, or 1.5 ounces (44 mL) of  liquor.  Avoid use of street drugs. Do not share needles with anyone. Ask for help if you need support or instructions about stopping the use of drugs.  High blood pressure causes heart disease and increases the risk of stroke. Blood pressure should be checked at least every 1 to 2 years. Ongoing high blood pressure should be treated with medicines if weight loss and exercise are not effective.  If you are 73 to 61 years old, ask your caregiver if you should take aspirin to prevent heart disease.  Diabetes screening involves taking a blood sample to check your fasting blood sugar level. This should be done once every 3 years, after age 53, if you are within normal weight and without risk factors for diabetes.  Testing should be considered at a younger age or be carried out more frequently if you are overweight and have at least 1 risk factor for diabetes.  Colorectal cancer can be detected and often prevented. Most routine colorectal cancer screening begins at the age of 71 and continues through age 42. However, your caregiver may recommend screening at an earlier age if you have risk factors for colon cancer. On a yearly basis, your caregiver may provide home test kits to check for hidden blood in the stool. Use of a small camera at the end of a tube, to directly examine the colon (sigmoidoscopy or colonoscopy), can detect the earliest forms of colorectal cancer. Talk to your caregiver about this at age 46, when routine screening begins. Direct examination of the colon should be repeated every 5 to 10 years through age 62, unless early forms of pre-cancerous polyps or small growths are found.  Hepatitis C blood testing is recommended for all people born from [birth year] through 1965 and any individual with known risks for hepatitis C.  Healthy men should no longer receive prostate-specific antigen (PSA) blood tests as part of routine cancer screening. Consult with your caregiver about prostate cancer  screening.  Testicular cancer screening is not recommended for adolescents or adult males who have no symptoms. Screening includes self-exam, caregiver exam, and other screening tests. Consult with your caregiver about any symptoms you have or any concerns you have about testicular cancer.  Practice safe sex. Use condoms and avoid high-risk sexual practices to reduce the spread of sexually transmitted infections (STIs).  Use sunscreen. Apply sunscreen liberally and repeatedly throughout the day. You should seek shade when your shadow is shorter than you. Protect yourself by wearing long sleeves, pants, a wide-brimmed hat, and sunglasses year round, whenever you are outdoors.  Notify your caregiver of new moles or changes in moles, especially if there is a change in shape or color. Also notify your caregiver if a mole is larger than the size of a pencil eraser.  A one-time screening for abdominal aortic aneurysm (AAA) and surgical repair of large AAAs by sound wave imaging (ultrasonography) is recommended for ages 41 to 70 years who are current or former smokers.  Stay current with your immunizations. Document Released: 05/07/2008 Document Revised: 03/06/2013 Document Reviewed: 04/06/2011 [facility] Patient Information 2014 [place], [place].

## 2013-10-16 NOTE — Assessment & Plan Note (Signed)
Incidental finding on ultrasound during hospitalization May 2014 for gastritis Recheck ultrasound now, pursue evaluation as needed

## 2013-10-16 NOTE — Assessment & Plan Note (Signed)
Symptoms currently stable. Medications currently filled by [facility] neurologist, but will be happy to fill here as needed

## 2013-10-16 NOTE — Progress Notes (Signed)
  Subjective:    Patient ID: Eric Waters, male    DOB: Mar 03, 1951, 62 y.o.   MRN: [id number]  HPI  Patient here today for annual wellness exam.  Chronic medical issues reviewed.    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   Past Medical History  Diagnosis Date  . Allergy   . GERD (gastroesophageal reflux disease)   . Huntington chorea     previously followed at [facility]- Dr Boris Sharper [phone] [name])  . Chronic diarrhea     Review of Systems     Objective:   Physical Exam  Wt Readings from Last 3 Encounters:  10/16/13 147 lb 12.8 oz (67.042 kg)  09/26/13 155 lb 8 oz (70.534 kg)  09/13/13 159 lb (72.122 kg)   BP Readings from Last 3 Encounters:  10/16/13 110/72  09/26/13 140/92  09/13/13 138/86   Lab Results  Component Value Date   WBC 5.4 04/09/2013   HGB 14.0 04/09/2013   HCT 40.5 04/09/2013   PLT 206 04/09/2013   GLUCOSE 95 04/10/2013   CHOL 221* 04/09/2013   TRIG 54 04/09/2013   HDL 87 04/09/2013   LDLCALC 123* 04/09/2013   ALT 53 04/10/2013   AST 104* 04/10/2013   NA 139 04/10/2013   K 3.5 04/10/2013   CL 105 04/10/2013   CREATININE 1.45* 04/10/2013   BUN 11 04/10/2013   CO2 22 04/10/2013   TSH 0.875 04/09/2013   HGBA1C 5.8* 04/09/2013         Assessment & Plan:    I have personally reviewed this case with PA student. I also personally examined this patient. Please see my note for other details. I reviewed, discussed and approve of the assessment and plan as listed. Rene Paci, MD

## 2013-10-23 ENCOUNTER — Ambulatory Visit
Admission: RE | Admit: 2013-10-23 | Discharge: 2013-10-23 | Disposition: A | Discharge status: Home / Self Care | Payer: Medicare Other | Source: Ambulatory Visit | Attending: Internal Medicine | Admitting: Internal Medicine

## 2013-10-23 DIAGNOSIS — K7689 Other specified diseases of liver: Secondary | ICD-10-CM

## 2013-10-24 ENCOUNTER — Other Ambulatory Visit: Payer: Self-pay | Admitting: Internal Medicine

## 2013-10-24 DIAGNOSIS — K7689 Other specified diseases of liver: Secondary | ICD-10-CM

## 2013-10-26 ENCOUNTER — Encounter: Payer: Self-pay | Admitting: Family Medicine

## 2013-10-26 ENCOUNTER — Ambulatory Visit (INDEPENDENT_AMBULATORY_CARE_PROVIDER_SITE_OTHER): Payer: Medicare Other | Admitting: Family Medicine

## 2013-10-26 VITALS — BP 130/84 | HR 74

## 2013-10-26 DIAGNOSIS — S62309A Unspecified fracture of unspecified metacarpal bone, initial encounter for closed fracture: Secondary | ICD-10-CM

## 2013-10-26 NOTE — Progress Notes (Signed)
Pre-visit discussion using our clinic review tool. No additional management support is needed unless otherwise documented below in the visit note.  

## 2013-10-26 NOTE — Progress Notes (Signed)
CC: hand pain, followup second and third the carpal fractures  HPI: 62 yo gentleman past medical history significant for Huntington's chorea coming in for followup of his second and third metacarpal fractures. Patient now has only been wearing the brace at night. Patient states that he has been feeling very good. Patient states he has some discomfort from time to time but overall doing well. Patient is able to grip things line, denies any numbness or tingling. Does have some swelling of the hand but otherwise no other significant symptoms.  Past medical, surgical, family and social history reviewed. Medications reviewed all in the electronic medical record.   Review of Systems: No headache, visual changes, nausea, vomiting, diarrhea, constipation, dizziness, abdominal pain, skin rash, fevers, chills, night sweats, weight loss, swollen lymph nodes, body aches, joint swelling, muscle aches, chest pain, shortness of breath, mood changes.   Objective:    Blood pressure 130/84, pulse 74, SpO2 96.00%.   General: The patient does have chorea movements are multiple limbs including neck at all times. The masked face appreciated. HEENT: Pupils equal, extraocular movements intact Respiratory: Patient's speak in full sentences and does not appear short of breath Cardiovascular: No lower extremity edema, non tender, no erythema Skin: Warm dry intact with no signs of infection or rash on extremities or on axial skeleton. Abdomen: Soft nontender Neuro: Cranial nerves II through XII are intact, neurovascularly intact in all extremities  Lymph: No lymphadenopathy of posterior or anterior cervical chain or axillae bilaterally.  Gait patient's gait and balance his abnormal secondary to chorea movements MSK: Non tender with full range of motion and good stability and symmetric strength and tone of shoulders, elbows, wrist, hip, knee and ankles bilaterally. Patient does have cogwheeling of all joints and  rigidity. Wrist: Right Inspection shows decreased swelling and no discoloration of the distal wrist and hand. Patient does keep thumb in an adductor state at baseline ROM does seem to be somewhat rigid secondary to the chorea but also significantly tender with ulnar and radial deviation Patient is nontender on exam.  Plan: Patient is doing very well at this time in followup on an as-needed basis. Discussed to help him with the swelling that likely will resolve over the course of multiple months to try to have more elevation. Patient will keep the brace in case he has more pain. Patient to is able to do all activities as tolerated.   Impression and Recommendations:     This case required medical decision making of moderate complexity.

## 2014-08-25 ENCOUNTER — Encounter: Payer: Self-pay | Admitting: Gastroenterology

## 2014-10-17 ENCOUNTER — Encounter: Payer: Self-pay | Admitting: Internal Medicine

## 2014-11-16 ENCOUNTER — Observation Stay (HOSPITAL_COMMUNITY): Payer: Medicare Other

## 2014-11-16 ENCOUNTER — Emergency Department (HOSPITAL_COMMUNITY): Payer: Medicare Other

## 2014-11-16 ENCOUNTER — Encounter (HOSPITAL_COMMUNITY): Payer: Self-pay | Admitting: Emergency Medicine

## 2014-11-16 ENCOUNTER — Inpatient Hospital Stay (HOSPITAL_COMMUNITY)
Admission: EM | Admit: 2014-11-16 | Discharge: 2014-11-20 | DRG: 065 | Disposition: A | Discharge status: Skilled Nursing Facility | Payer: Medicare Other | Attending: Internal Medicine | Admitting: Internal Medicine

## 2014-11-16 DIAGNOSIS — I639 Cerebral infarction, unspecified: Principal | ICD-10-CM

## 2014-11-16 DIAGNOSIS — E785 Hyperlipidemia, unspecified: Secondary | ICD-10-CM | POA: Diagnosis not present

## 2014-11-16 DIAGNOSIS — I129 Hypertensive chronic kidney disease with stage 1 through stage 4 chronic kidney disease, or unspecified chronic kidney disease: Secondary | ICD-10-CM | POA: Diagnosis present

## 2014-11-16 DIAGNOSIS — N183 Chronic kidney disease, stage 3 unspecified: Secondary | ICD-10-CM | POA: Diagnosis present

## 2014-11-16 DIAGNOSIS — K219 Gastro-esophageal reflux disease without esophagitis: Secondary | ICD-10-CM | POA: Diagnosis present

## 2014-11-16 DIAGNOSIS — R269 Unspecified abnormalities of gait and mobility: Secondary | ICD-10-CM | POA: Diagnosis present

## 2014-11-16 DIAGNOSIS — R4701 Aphasia: Secondary | ICD-10-CM | POA: Diagnosis present

## 2014-11-16 DIAGNOSIS — R29810 Facial weakness: Secondary | ICD-10-CM | POA: Diagnosis present

## 2014-11-16 DIAGNOSIS — G10 Huntington's disease: Secondary | ICD-10-CM | POA: Diagnosis not present

## 2014-11-16 DIAGNOSIS — F339 Major depressive disorder, recurrent, unspecified: Secondary | ICD-10-CM | POA: Diagnosis not present

## 2014-11-16 DIAGNOSIS — R1311 Dysphagia, oral phase: Secondary | ICD-10-CM | POA: Diagnosis not present

## 2014-11-16 DIAGNOSIS — R1313 Dysphagia, pharyngeal phase: Secondary | ICD-10-CM | POA: Diagnosis not present

## 2014-11-16 DIAGNOSIS — Z886 Allergy status to analgesic agent status: Secondary | ICD-10-CM | POA: Diagnosis not present

## 2014-11-16 DIAGNOSIS — E119 Type 2 diabetes mellitus without complications: Secondary | ICD-10-CM | POA: Diagnosis not present

## 2014-11-16 DIAGNOSIS — Z881 Allergy status to other antibiotic agents status: Secondary | ICD-10-CM

## 2014-11-16 LAB — I-STAT TROPONIN, ED: Troponin i, poc: 0 ng/mL (ref 0.00–0.08)

## 2014-11-16 LAB — I-STAT CHEM 8, ED
BUN: 10 mg/dL (ref 6–23)
CREATININE: 1.1 mg/dL (ref 0.50–1.35)
Calcium, Ion: 1.14 mmol/L (ref 1.13–1.30)
Chloride: 104 mEq/L (ref 96–112)
GLUCOSE: 112 mg/dL — AB (ref 70–99)
HCT: 48 % (ref 39.0–52.0)
Hemoglobin: 16.3 g/dL (ref 13.0–17.0)
POTASSIUM: 4.3 mmol/L (ref 3.5–5.1)
Sodium: 139 mmol/L (ref 135–145)
TCO2: 22 mmol/L (ref 0–100)

## 2014-11-16 LAB — COMPREHENSIVE METABOLIC PANEL
ALT: 12 U/L (ref 0–53)
AST: 20 U/L (ref 0–37)
Albumin: 3.7 g/dL (ref 3.5–5.2)
Alkaline Phosphatase: 121 U/L — ABNORMAL HIGH (ref 39–117)
Anion gap: 10 (ref 5–15)
BILIRUBIN TOTAL: 0.8 mg/dL (ref 0.3–1.2)
BUN: 9 mg/dL (ref 6–23)
CALCIUM: 9.2 mg/dL (ref 8.4–10.5)
CHLORIDE: 104 meq/L (ref 96–112)
CO2: 22 mmol/L (ref 19–32)
Creatinine, Ser: 1.33 mg/dL (ref 0.50–1.35)
GFR, EST AFRICAN AMERICAN: 64 mL/min — AB (ref >90)
GFR, EST NON AFRICAN AMERICAN: 55 mL/min — AB (ref >90)
GLUCOSE: 114 mg/dL — AB (ref 70–99)
Potassium: 4.3 mmol/L (ref 3.5–5.1)
SODIUM: 136 mmol/L (ref 135–145)
Total Protein: 6.9 g/dL (ref 6.0–8.3)

## 2014-11-16 LAB — CBC
HCT: 42.3 % (ref 39.0–52.0)
Hemoglobin: 14.2 g/dL (ref 13.0–17.0)
MCH: 29.2 pg (ref 26.0–34.0)
MCHC: 33.6 g/dL (ref 30.0–36.0)
MCV: 87 fL (ref 78.0–100.0)
PLATELETS: 236 10*3/uL (ref 150–400)
RBC: 4.86 MIL/uL (ref 4.22–5.81)
RDW: 13.1 % (ref 11.5–15.5)
WBC: 6.3 10*3/uL (ref 4.0–10.5)

## 2014-11-16 LAB — DIFFERENTIAL
Basophils Absolute: 0 10*3/uL (ref 0.0–0.1)
Basophils Relative: 0 % (ref 0–1)
EOS PCT: 0 % (ref 0–5)
Eosinophils Absolute: 0 10*3/uL (ref 0.0–0.7)
LYMPHS ABS: 1.2 10*3/uL (ref 0.7–4.0)
LYMPHS PCT: 20 % (ref 12–46)
Monocytes Absolute: 0.5 10*3/uL (ref 0.1–1.0)
Monocytes Relative: 8 % (ref 3–12)
NEUTROS ABS: 4.6 10*3/uL (ref 1.7–7.7)
Neutrophils Relative %: 72 % (ref 43–77)

## 2014-11-16 LAB — PROTIME-INR
INR: 1.12 (ref 0.00–1.49)
PROTHROMBIN TIME: 14.5 s (ref 11.6–15.2)

## 2014-11-16 LAB — CBG MONITORING, ED: Glucose-Capillary: 102 mg/dL — ABNORMAL HIGH (ref 70–99)

## 2014-11-16 LAB — APTT: aPTT: 29 seconds (ref 24–37)

## 2014-11-16 LAB — ETHANOL: Alcohol, Ethyl (B): <5 mg/dL (ref 0–9)

## 2014-11-16 MED ORDER — ENOXAPARIN SODIUM 40 MG/0.4ML ~~LOC~~ SOLN
40.0000 mg | SUBCUTANEOUS | Status: DC
  Administered 2014-11-16 – 2014-11-19 (×4): 40 mg via SUBCUTANEOUS
  Filled 2014-11-16 (×4): qty 0.4

## 2014-11-16 MED ORDER — ACETAMINOPHEN 650 MG RE SUPP: 650.0000 mg | RECTAL | Status: DC | PRN

## 2014-11-16 MED ORDER — ASPIRIN 300 MG RE SUPP
300.0000 mg | Freq: Once | RECTAL | Status: AC
  Administered 2014-11-16: 300 mg via RECTAL
  Filled 2014-11-16: qty 1

## 2014-11-16 MED ORDER — STROKE: EARLY STAGES OF RECOVERY BOOK
Freq: Once | Status: AC
  Administered 2014-11-16: 18:00:00
  Filled 2014-11-16: qty 1

## 2014-11-16 MED ORDER — TETANUS-DIPHTH-ACELL PERTUSSIS 5-2.5-18.5 LF-MCG/0.5 IM SUSP
0.5000 mL | Freq: Once | INTRAMUSCULAR | Status: AC
  Administered 2014-11-16: 0.5 mL via INTRAMUSCULAR
  Filled 2014-11-16: qty 0.5

## 2014-11-16 MED ORDER — ASPIRIN 300 MG RE SUPP
300.0000 mg | Freq: Every day | RECTAL | Status: DC
  Administered 2014-11-17 – 2014-11-20 (×4): 300 mg via RECTAL
  Filled 2014-11-16 (×4): qty 1

## 2014-11-16 MED ORDER — SODIUM CHLORIDE 0.9 % IV SOLN
INTRAVENOUS | Status: DC
  Administered 2014-11-16: 18:00:00 via INTRAVENOUS

## 2014-11-16 NOTE — ED Notes (Addendum)
Pt family reports pt was at baseline last night when talking to pt.  Today pt facetimed with family and family noticed pt was not speaking or acting right.  Right sided facial droop noted, pt aphasic with right leg weakness.  Pt is drooling. Can follow commands. Pt has huntingtons disease.

## 2014-11-16 NOTE — ED Notes (Signed)
Pts family member at bedside requesting to be updated on plan of care. Fraser Dinreston Cell: [phone] Home: [phone]

## 2014-11-16 NOTE — ED Provider Notes (Addendum)
CSN: [id number]     Arrival date & time 11/16/14  1349 History   First MD Initiated Contact with Patient 11/16/14 1359     Chief Complaint  Patient presents with  . Stroke Symptoms    Level V caveat patient a phasic history is obtained from his cousin who accompanies him (Consider location/radiation/quality/duration/timing/severity/associated sxs/prior Treatment) HPI patient's cousin check on him this morning to find him sitting in a chair, less responsive. Unable to speak. He would not get up from a chair. No treatment prior to coming here. 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 Patient not answer questions.  Past Medical History  Diagnosis Date  . Allergy   . GERD (gastroesophageal reflux disease)   . Huntington chorea     [facility] Dr Boris Sharper[facility] Walker [phone] [name]  . Chronic diarrhea    Past Surgical History  Procedure Laterality Date  . Tonsillectomy  1959   Family History  Problem Relation Age of Onset  . Cancer Father     Lung  . Alzheimer's disease Maternal Uncle   . Stroke Maternal Uncle     Alzheimer  . Alzheimer's disease Maternal Grandfather   . Huntington's disease Maternal Grandmother   . Huntington's disease Mother    History  Substance Use Topics  . Smoking status: Never Smoker   . Smokeless tobacco: Never Used     Comment: Single, separated from wife 2003. Lives with friend of family. Moved to [name] area from [place] spring 2010 [facility]  . Alcohol Use: No    Review of Systems  Unable to perform ROS Musculoskeletal: Positive for gait problem.   patient a phasic    Allergies  Floxin; Haldol; and Codeine  Home Medications   Prior to Admission medications   Medication Sig Start Date End Date Taking? Authorizing Provider  cetirizine (ZYRTEC) 10 MG tablet Take 10 mg by mouth daily as needed for allergies.    Historical Provider, MD  fexofenadine  (ALLEGRA) 180 MG tablet Take 180 mg by mouth daily as needed (for allergies).    Historical Provider, MD  FLUoxetine (PROZAC) 20 MG tablet Take 20 mg by mouth daily.    Historical Provider, MD  haloperidol (HALDOL) 1 MG tablet Take 1 mg by mouth 2 (two) times daily.    Historical Provider, MD  HYDROcodone-acetaminophen (NORCO) 5-325 MG per tablet Take 1 tablet by mouth 3 (three) times daily as needed for pain. 09/11/13   Judi SaaZachary M Smith, DO  omeprazole (PRILOSEC) 20 MG capsule Take 20 mg by mouth daily.    Historical Provider, MD   BP 132/82 mmHg  Pulse 67  Temp(Src) 98.5 F (36.9 C) (Oral)  Resp 20  SpO2 98% Physical Exam  Constitutional:  Chronically ill-appearing  HENT:  Head: Normocephalic and atraumatic.  Eyes: Conjunctivae are normal. Pupils are equal, round, and reactive to light.  Neck: Neck supple. No tracheal deviation present. No thyromegaly present.  Cardiovascular: Normal rate and regular rhythm.   No murmur heard. Pulmonary/Chest: Effort normal and breath sounds normal.  Abdominal: Soft. Bowel sounds are normal. He exhibits no distension. There is no tenderness.  Musculoskeletal: Normal range of motion. He exhibits no edema or tenderness.  Neurological: He is alert. Coordination normal.  Gag reflex intact does not follow simple commands. Expressive aphasia. Hyperreflexic at knee jerk ankle jerk and biceps bilaterally. Greater than 20 beat clonus at both ankles bilaterally  Skin: Skin  is warm and dry. No rash noted.  Abrasion to ulnar aspect of left forearm  Psychiatric: He has a normal mood and affect.  Nursing note and vitals reviewed.  At 5 PM patient is alert talkative and answers questions appropriately ED Course  Procedures (including critical care time) Labs Review Labs Reviewed  CBG MONITORING, ED - Abnormal; Notable for the following:    Glucose-Capillary 102 (*)    All other components within normal limits  ETHANOL  PROTIME-INR  APTT  CBC   DIFFERENTIAL  COMPREHENSIVE METABOLIC PANEL  URINE RAPID DRUG SCREEN (HOSP PERFORMED)  URINALYSIS, ROUTINE W REFLEX MICROSCOPIC  I-STAT CHEM 8, ED  I-STAT TROPOININ, ED    Imaging Review No results found.   EKG Interpretation   Date/Time:  Friday November 16 2014 14:02:39 EST Ventricular Rate:  64 PR Interval:  158 QRS Duration: 80 QT Interval:  390 QTC Calculation: 402 R Axis:   43 Text Interpretation:  Sinus rhythm Since last tracing rate slower  Confirmed by Ethelda Chick  MD, Lovell Roe [phone]) on 11/16/2014 2:40:43 PM     Results for orders placed or performed during the hospital encounter of 11/16/14  Ethanol  Result Value Ref Range   Alcohol, Ethyl (B) <5 0 - 9 mg/dL  Protime-INR  Result Value Ref Range   Prothrombin Time 14.5 11.6 - 15.2 seconds   INR 1.12 0.00 - 1.49  APTT  Result Value Ref Range   aPTT 29 24 - 37 seconds  CBC  Result Value Ref Range   WBC 6.3 4.0 - 10.5 K/uL   RBC 4.86 4.22 - 5.81 MIL/uL   Hemoglobin 14.2 13.0 - 17.0 g/dL   HCT 60.4 54.0 - 98.1 %   MCV 87.0 78.0 - 100.0 fL   MCH 29.2 26.0 - 34.0 pg   MCHC 33.6 30.0 - 36.0 g/dL   RDW 19.1 47.8 - 29.5 %   Platelets 236 150 - 400 K/uL  Differential  Result Value Ref Range   Neutrophils Relative % 72 43 - 77 %   Neutro Abs 4.6 1.7 - 7.7 K/uL   Lymphocytes Relative 20 12 - 46 %   Lymphs Abs 1.2 0.7 - 4.0 K/uL   Monocytes Relative 8 3 - 12 %   Monocytes Absolute 0.5 0.1 - 1.0 K/uL   Eosinophils Relative 0 0 - 5 %   Eosinophils Absolute 0.0 0.0 - 0.7 K/uL   Basophils Relative 0 0 - 1 %   Basophils Absolute 0.0 0.0 - 0.1 K/uL  Comprehensive metabolic panel  Result Value Ref Range   Sodium 136 135 - 145 mmol/L   Potassium 4.3 3.5 - 5.1 mmol/L   Chloride 104 96 - 112 mEq/L   CO2 22 19 - 32 mmol/L   Glucose, Bld 114 (H) 70 - 99 mg/dL   BUN 9 6 - 23 mg/dL   Creatinine, Ser 6.21 0.50 - 1.35 mg/dL   Calcium 9.2 8.4 - 30.8 mg/dL   Total Protein 6.9 6.0 - 8.3 g/dL   Albumin 3.7 3.5 - 5.2 g/dL    AST 20 0 - 37 U/L   ALT 12 0 - 53 U/L   Alkaline Phosphatase 121 (H) 39 - 117 U/L   Total Bilirubin 0.8 0.3 - 1.2 mg/dL   GFR calc non Af Amer 55 (L) >90 mL/min   GFR calc Af Amer 64 (L) >90 mL/min   Anion gap 10 5 - 15  POC CBG, ED  Result Value Ref Range  Glucose-Capillary 102 (H) 70 - 99 mg/dL  I-Stat Chem 8, ED  Result Value Ref Range   Sodium 139 135 - 145 mmol/L   Potassium 4.3 3.5 - 5.1 mmol/L   Chloride 104 96 - 112 mEq/L   BUN 10 6 - 23 mg/dL   Creatinine, Ser 9.601.10 0.50 - 1.35 mg/dL   Glucose, Bld 454112 (H) 70 - 99 mg/dL   Calcium, Ion 0.981.14 1.191.13 - 1.30 mmol/L   TCO2 22 0 - 100 mmol/L   Hemoglobin 16.3 13.0 - 17.0 g/dL   HCT 14.748.0 82.939.0 - 56.252.0 %  I-Stat Troponin, ED (not at [facility])  Result Value Ref Range   Troponin i, poc 0.00 0.00 - 0.08 ng/mL   Comment 3           Ct Head Wo Contrast  11/16/2014   CLINICAL DATA:  Right facial droop  EXAM: CT HEAD WITHOUT CONTRAST  TECHNIQUE: Contiguous axial images were obtained from the base of the skull through the vertex without intravenous contrast.  COMPARISON:  None.  FINDINGS: Global atrophy. Extensive chronic ischemic changes in the periventricular white matter and basal ganglia. No mass effect, midline shift, or acute intracranial hemorrhage. No evidence of hyperdense MCA. Partial opacification of left posterior ethmoid air cells.  IMPRESSION: No acute intracranial pathology.  Chronic changes are noted.   Electronically Signed   By: Maryclare BeanArt  Hoss M.D.   On: 11/16/2014 15:45    MDM  Dr. Amada JupiterKirkpatrick from neurology was contacted on case. Feels that presentation is consistent with stroke. Aspirin ordered. T dap ordered. Pt does not recalll last inmmunization Spoke with Dr.Hongalgi plan 23 hour observation telemetry Diagnosis #1altered mental status #2 aphasia #3abrasion to left forearm Final diagnoses:  None        Doug SouSam Shateria Paternostro, MD 11/16/14 1707  Doug SouSam Franciscojavier Wronski, MD 11/16/14 [phone]

## 2014-11-16 NOTE — ED Notes (Signed)
Pt transported to CT ?

## 2014-11-16 NOTE — Progress Notes (Signed)
Patient admitted through ED. Patient is alert but aphasic. Will continue to monitor.

## 2014-11-16 NOTE — ED Notes (Signed)
Pt family reports pt was at baseline last night when talking to pt.  Today pt facetimed with family and family noticed pt was not speaking or acting right.  Right sided facial droop noted, pt aphasic with left leg weakness.  Pt is drooling. Can follow commands. Pt has huntingtons disease.

## 2014-11-16 NOTE — Consult Note (Addendum)
Neurology Consultation Reason for Consult: Aphasia Referring Physician: Alden ServerJacubowitz, S  CC: Aphasia  History is obtained from: Friend  HPI: Eric Waters is a 63 y.o. male with a history of Huntington's disease who presents with aphasia that is new today. His cousin who is with him states that he seemed in his normal state yesterday, but today he checked on him and found him to not be doing his usual routine. He therefore went over to check on him and person, and found him to not be speaking. On arrival here he has a dense aphasia as well as a right facial droop. Neurology was consulted for further evaluation and management   LKW: 11/15/14 7 PM tpa given?: no, outside of window    ROS: Unable to obtain due to altered mental status.   Past Medical History  Diagnosis Date  . Allergy   . GERD (gastroesophageal reflux disease)   . Huntington chorea     [facility] Dr Boris SharperFrancis Walker [phone] [name]  . Chronic diarrhea     Family History: Unable to obtain due to altered mental status.  Social History: Tob: Unable to obtain due to altered mental status.  Exam: Current vital signs: BP 110/75 mmHg  Pulse 61  Temp(Src) 98.5 F (36.9 C) (Oral)  Resp 16  SpO2 96% Vital signs in last 24 hours: Temp:  [98.5 F (36.9 C)] 98.5 F (36.9 C) (12/25 1404) Pulse Rate:  [61-68] 61 (12/25 1745) Resp:  [14-23] 16 (12/25 1745) BP: (110-132)/(62-82) 110/75 mmHg (12/25 1745) SpO2:  [96 %-99 %] 96 % (12/25 1745)  Physical Exam  Constitutional: Appears well-developed and well-nourished.  Psych: Affect appropriate to situation Eyes: No scleral injection HENT: No OP obstrucion Head: Normocephalic.  Cardiovascular: Normal rate and regular rhythm.  Respiratory: Effort normal and breath sounds normal to anterior ascultation GI: Soft.  No distension. There is no tenderness.  Skin: WDI  Neuro: Mental Status: Patient is awake, alert, he is unable to state his name,  states yes and no but not appropriately He is unable to follow commands, and his output is greatly reduced.  Cranial Nerves: II: Visual Fields are full. Pupils are equal, round, and reactive to light.   III,IV, VI: EOMI without ptosis or diploplia.  V: Facial sensation is symmetric to temperature VII: Facial movement is notable for a prominent right facial droop VIII: hearing is intact to voice X: Uvula elevates symmetrically XI: Shoulder shrug is symmetric. XII: tongue is midline without atrophy or fasciculations.  Motor: Tone is he has increased tone throughout, he holds his left arm and a flexed posture, though he is able to extend when he wants to. I do question if he might have some mild right arm and leg weakness, but it is difficult to assess due to patient's inability to cooperate with following commands. Sensory: Responds to touch on both sides in the arms and legs Deep Tendon Reflexes: Hyperreflexic throughout Cerebellar: Does not perform. He does have a tremor of his right arm which is cousin says is normal for him.          I have reviewed labs in epic and the results pertinent to this consultation are: Chem 8 unremarkable  I have reviewed the images obtained: CT head unremarkable  Impression:63 year old male with a history of Huntington's disease who presents with new onset right-sided facial droop and possible weakness of the arm and leg as well as dense aphasia. I strongly suspect that this represents a stroke. He  will need a stroke workup.  recommendations: 1. HgbA1c, fasting lipid panel 2. MRI, MRA  of the brain without contrast 3. Frequent neuro checks 4. Echocardiogram 5. Carotid dopplers 6. Prophylactic therapy-Antiplatelet med: Aspirin - dose 325mg  PO or 300mg  PR 7. Risk factor modification 8. Telemetry monitoring 9. PT consult, OT consult, Speech consult 10. Continue home Haldol. Fluoxetine as long as swallow screen is past  Ritta SlotMcNeill Skyelar Swigart,  MD [facility] [phone]  If 7pm- 7am, please page neurology on call as listed in AMION.

## 2014-11-16 NOTE — H&P (Signed)
History and Physical  Eric Stagerlonzo Brase ZOX:[id number] DOB: 1951/06/11 DOA: 11/16/2014  Referring physician: Dr. Doug SouSam Jacubowitz, EDP PCP: Rene PaciValerie Leschber, MD  Outpatient Specialists:  1. Neurology: Dr. Boris SharperFrancis Walker  Chief Complaint: Unable to speak.  HPI: Eric Waters is a 63 y.o. male with history of Huntington's Chorea, abnormal gait secondary to same, MDD, presented to the ED with above complaints. Patient unable to provide any history secondary to aphasia. History is obtained from patient's caregiver/health care power of attorney Mr. [name]. Patient was is his usual state of health until 7 PM on 11/15/14. Patient has multiple cameras in his room and the caregiver is able to contact him through that. He is coherent at baseline with some gait abnormality. At approximately 12 PM on 11/16/14 when caregiver tried to contact him, he noticed that the television in patient's house was turned off which is very unusual for him. Patient would not respond to his call. Mr. Fraser Dinreston then went by to patient's room and noticed that patient was not speaking and had facial droop. There was no reported asymmetrical limb weakness. He was brought to the emergency room where CT head was negative. Neurology was consulted by EDP and recommend hospitalist admission for stroke workup.   Review of Systems: All systems reviewed and apart from history of presenting illness, are negative.  Past Medical History  Diagnosis Date  . Allergy   . GERD (gastroesophageal reflux disease)   . Huntington chorea     [facility] Dr Boris SharperFrancis Walker [phone] [name]  . Chronic diarrhea    Past Surgical History  Procedure Laterality Date  . Tonsillectomy  1959   Social History:  reports that he has never smoked. He has never used smokeless tobacco. He reports that he does not drink alcohol or use illicit drugs. Patient is separated for many years. He has been living alone for the last 6 months. He  is able to ambulate with some gait abnormality. At baseline coherent and talks normally.  Allergies  Allergen Reactions  . Floxin [Ofloxacin]   . Haldol [Haloperidol Lactate]   . Codeine Hives and Rash    Family History  Problem Relation Age of Onset  . Cancer Father     Lung  . Alzheimer's disease Maternal Uncle   . Stroke Maternal Uncle     Alzheimer  . Alzheimer's disease Maternal Grandfather   . Huntington's disease Maternal Grandmother   . Huntington's disease Mother     Prior to Admission medications   Medication Sig Start Date End Date Taking? Authorizing Provider  FLUoxetine (PROZAC) 20 MG tablet Take 20 mg by mouth daily.   Yes Historical Provider, MD  haloperidol (HALDOL) 1 MG tablet Take 1 mg by mouth 2 (two) times daily.   Yes Historical Provider, MD   Physical Exam: Filed Vitals:   11/16/14 1630 11/16/14 1645 11/16/14 1700 11/16/14 1715  BP: 120/70 131/72 122/69 116/80  Pulse: 65 63 66 64  Temp:      TempSrc:      Resp: 17 16 14 19   SpO2: 96% 98% 98% 98%   temperature: 98.32F.   General exam: Moderately built and nourished pleasant middle-aged male patient, lying comfortably supine on the gurney in no obvious distress.  Head, eyes and ENT: Nontraumatic and normocephalic. Pupils equally reacting to light and accommodation. Oral mucosa moist.  Neck: Supple. No JVD, carotid bruit or thyromegaly.  Lymphatics: No lymphadenopathy.  Respiratory system: Clear to auscultation/poor inspiratory effort. No increased  work of breathing.  Cardiovascular system: S1 and S2 heard, RRR. No JVD, murmurs, gallops, clicks or pedal edema.  Gastrointestinal system: Abdomen is nondistended, soft and nontender. Normal bowel sounds heard. No organomegaly or masses appreciated.  Central nervous system: Alert. Not oriented. Does not follow instructions. Aphasic. Patient has facial asymmetry with increased prominence of left nasolabial fold. No other focal neurological deficits  noted.  Extremities: Symmetric 4 x 5 power at least. Peripheral pulses symmetrically felt. Patient has wasting of small muscles of the dorsum of the hand and fingers held in grip-like position but able to open his fingers.  Skin: No rashes or acute findings.  Musculoskeletal system: Negative exam.  Psychiatry: Pleasant and cooperative.   Labs on Admission:  Basic Metabolic Panel:  Recent Labs Lab 11/16/14 1420 11/16/14 1432  NA 136 139  K 4.3 4.3  CL 104 104  CO2 22  --   GLUCOSE 114* 112*  BUN 9 10  CREATININE 1.33 1.10  CALCIUM 9.2  --    Liver Function Tests:  Recent Labs Lab 11/16/14 1420  AST 20  ALT 12  ALKPHOS 121*  BILITOT 0.8  PROT 6.9  ALBUMIN 3.7   No results for input(s): LIPASE, AMYLASE in the last 168 hours. No results for input(s): AMMONIA in the last 168 hours. CBC:  Recent Labs Lab 11/16/14 1420 11/16/14 1432  WBC 6.3  --   NEUTROABS 4.6  --   HGB 14.2 16.3  HCT 42.3 48.0  MCV 87.0  --   PLT 236  --    Cardiac Enzymes: No results for input(s): CKTOTAL, CKMB, CKMBINDEX, TROPONINI in the last 168 hours.  BNP (last 3 results) No results for input(s): PROBNP in the last 8760 hours. CBG:  Recent Labs Lab 11/16/14 1411  GLUCAP 102*    Radiological Exams on Admission: Ct Head Wo Contrast  11/16/2014   CLINICAL DATA:  Right facial droop  EXAM: CT HEAD WITHOUT CONTRAST  TECHNIQUE: Contiguous axial images were obtained from the base of the skull through the vertex without intravenous contrast.  COMPARISON:  None.  FINDINGS: Global atrophy. Extensive chronic ischemic changes in the periventricular white matter and basal ganglia. No mass effect, midline shift, or acute intracranial hemorrhage. No evidence of hyperdense MCA. Partial opacification of left posterior ethmoid air cells.  IMPRESSION: No acute intracranial pathology.  Chronic changes are noted.   Electronically Signed   By: Maryclare BeanArt  Hoss M.D.   On: 11/16/2014 15:45    EKG:  Independently reviewed. Sinus rhythm at 64 bpm, normal axis without acute changes. QTC 402 ms.  Assessment/Plan Active Problems:   HUNTINGTONS CHOREA   Major depressive disorder, recurrent   CKD (chronic kidney disease), stage III   CVA (cerebral infarction)   Aphasia   1. Possible CVA with receptive and expressive aphasia: CT head negative. Admit to telemetry. Complete stroke workup-MRI/MRA brain, 2-D echo, carotid Dopplers, fasting lipids and hemoglobin A1c. Patient failed bedside swallow screen. Received rectal aspirin in ED-continue same pending swallow eval by speech therapy. PT, OT and ST evaluation. Last seen normal 12/24 at 7 PM. Out of window for TPA. Not on antiplatelets prior to admission. 2. Stage III chronic kidney disease: Creatinine probably at baseline. Follow BMP 3. History of Huntington's chorea: [facility]. 4. History of recurrent major depression: Prozac and Haldol currently held secondary to nothing by mouth status. Resume in a.m. pending ST evaluation.     Code Status: Full-confirmed with patient's  healthcare power of attorney.  Family Communication: Discussed with Mr. Tori Milks, patient's caregiver and health care power of attorney.  Disposition Plan: Home when medically stable.   Time spent: 60 minutes  HONGALGI,ANAND, MD, FACP, FHM. [facility] Pager [phone]  If 7PM-7AM, please contact night-coverage [email or web address] Password [email or web address] 11/16/2014, 5:20 PM

## 2014-11-17 ENCOUNTER — Inpatient Hospital Stay (HOSPITAL_COMMUNITY): Payer: Medicare Other

## 2014-11-17 DIAGNOSIS — K219 Gastro-esophageal reflux disease without esophagitis: Secondary | ICD-10-CM | POA: Diagnosis present

## 2014-11-17 DIAGNOSIS — R269 Unspecified abnormalities of gait and mobility: Secondary | ICD-10-CM | POA: Diagnosis present

## 2014-11-17 DIAGNOSIS — F339 Major depressive disorder, recurrent, unspecified: Secondary | ICD-10-CM | POA: Diagnosis present

## 2014-11-17 DIAGNOSIS — Z881 Allergy status to other antibiotic agents status: Secondary | ICD-10-CM | POA: Diagnosis not present

## 2014-11-17 DIAGNOSIS — R29810 Facial weakness: Secondary | ICD-10-CM | POA: Diagnosis present

## 2014-11-17 DIAGNOSIS — I517 Cardiomegaly: Secondary | ICD-10-CM

## 2014-11-17 DIAGNOSIS — Z886 Allergy status to analgesic agent status: Secondary | ICD-10-CM | POA: Diagnosis not present

## 2014-11-17 DIAGNOSIS — G10 Huntington's disease: Secondary | ICD-10-CM | POA: Diagnosis present

## 2014-11-17 DIAGNOSIS — I639 Cerebral infarction, unspecified: Secondary | ICD-10-CM | POA: Diagnosis present

## 2014-11-17 DIAGNOSIS — R1311 Dysphagia, oral phase: Secondary | ICD-10-CM | POA: Diagnosis present

## 2014-11-17 DIAGNOSIS — R1313 Dysphagia, pharyngeal phase: Secondary | ICD-10-CM | POA: Diagnosis present

## 2014-11-17 DIAGNOSIS — I129 Hypertensive chronic kidney disease with stage 1 through stage 4 chronic kidney disease, or unspecified chronic kidney disease: Secondary | ICD-10-CM | POA: Diagnosis present

## 2014-11-17 DIAGNOSIS — N183 Chronic kidney disease, stage 3 (moderate): Secondary | ICD-10-CM | POA: Diagnosis present

## 2014-11-17 DIAGNOSIS — E785 Hyperlipidemia, unspecified: Secondary | ICD-10-CM | POA: Diagnosis present

## 2014-11-17 DIAGNOSIS — E119 Type 2 diabetes mellitus without complications: Secondary | ICD-10-CM | POA: Diagnosis present

## 2014-11-17 DIAGNOSIS — R4701 Aphasia: Secondary | ICD-10-CM | POA: Diagnosis present

## 2014-11-17 LAB — HEMOGLOBIN A1C
HEMOGLOBIN A1C: 6.1 % — AB (ref <5.7)
MEAN PLASMA GLUCOSE: 128 mg/dL — AB (ref <117)

## 2014-11-17 LAB — LIPID PANEL
Cholesterol: 214 mg/dL — ABNORMAL HIGH (ref 0–200)
HDL: 42 mg/dL (ref >39)
LDL Cholesterol: 151 mg/dL — ABNORMAL HIGH (ref 0–99)
Total CHOL/HDL Ratio: 5.1 RATIO
Triglycerides: 106 mg/dL (ref <150)
VLDL: 21 mg/dL (ref 0–40)

## 2014-11-17 NOTE — Progress Notes (Signed)
  Echocardiogram 2D Echocardiogram has been performed.  Janalyn HarderWest, Zamiya Dillard R 11/17/2014, 1:51 PM

## 2014-11-17 NOTE — Progress Notes (Addendum)
*  PRELIMINARY RESULTS* Vascular Ultrasound Carotid Duplex (Doppler) has been completed.  Preliminary findings: Bilateral:  1-39% ICA stenosis.  Vertebral artery flow is antegrade bilaterally, however the right vertebral is atypical with loss of diastolic component, suggesting a distal obstruction.     Farrel DemarkJill Eunice, RDMS, RVT  11/17/2014, 9:28 AM

## 2014-11-17 NOTE — Progress Notes (Signed)
Routine EEG completed, results pending. 

## 2014-11-17 NOTE — Procedures (Signed)
History: 63 year old male with history of Huntington's disease and tremor  Sedation: None  Technique: This is a 17 channel routine scalp EEG performed at the bedside with bipolar and monopolar montages arranged in accordance to the international 10/20 system of electrode placement. One channel was dedicated to EKG recording.    Background: The background consists predominantly of alpha activity. There is a well defined posterior dominant rhythm of 10 Hz that attenuates with eye opening. There is irregular delta activity in left temporal region primarily at F7, T7, but also seen at a lower amplitude and P3, P7.  Photic stimulation: Physiologic driving is not performed  EEG Abnormalities: 1) left temporoparietal delta activity  Clinical Interpretation: This EEG is consistent with a left frontotemporal region of cerebral dysfunction. There was no seizure or seizure predisposition recorded on this study.   Ritta SlotMcNeill Eric Spackman, MD [facility] [phone]  If 7pm- 7am, please page neurology on call as listed in AMION.

## 2014-11-17 NOTE — Evaluation (Signed)
Physical Therapy Evaluation Patient Details Name: Eric Waters MRN: [id number] DOB: [birth year] Today's Date: 11/17/2014   History of Present Illness  Pt adm with aphasia and rt facial droop. CT neg and MRI pending. PMH - Huntington's Chorea  Clinical Impression  Pt admitted with above diagnosis. Pt currently with functional limitations due to the deficits listed below (see PT Problem List).  Pt will benefit from skilled PT to increase their independence and safety with mobility to allow discharge to the venue listed below.  Feel pt would be excellent CIR candidate.     Follow Up Recommendations CIR    Equipment Recommendations  Other (comment) (to be assessed)    Recommendations for Other Services       Precautions / Restrictions Precautions Precautions: Fall Restrictions Weight Bearing Restrictions: No      Mobility  Bed Mobility Overal bed mobility: Needs Assistance Bed Mobility: Supine to Sit     Supine to sit: Min assist;HOB elevated     General bed mobility comments: Verbal/tactile/visual cues to initiate. Assist to move rt leg off of bed.  Transfers Overall transfer level: Needs assistance Equipment used: Rolling walker (2 wheeled) Transfers: Sit to/from UGI CorporationStand;Stand Pivot Transfers Sit to Stand: Mod assist Stand pivot transfers: Mod assist       General transfer comment: Assist to bring hips up and for balance. Pt unable to follow commands for hand placement. Rapid descent to chair. Short pivotal steps with walker.  Ambulation/Gait Ambulation/Gait assistance: Min assist;Mod assist Ambulation Distance (Feet): 5 Feet (forward and back) Assistive device: Rolling walker (2 wheeled) Gait Pattern/deviations: Step-to pattern;Decreased step length - right;Shuffle   Gait velocity interpretation: Below normal speed for age/gender General Gait Details: Pt with difficulty advancing RLE especially when stepping backwards.  Stairs            Wheelchair  Mobility    Modified Rankin (Stroke Patients Only)       Balance Overall balance assessment: Needs assistance Sitting-balance support: No upper extremity supported;Feet supported Sitting balance-Leahy Scale: Fair     Standing balance support: Bilateral upper extremity supported Standing balance-Leahy Scale: Poor Standing balance comment: support of walker and min A for static standing.                             Pertinent Vitals/Pain Pain Assessment: Faces Pain Score: 0-No pain    Home Living Family/patient expects to be discharged to:: Private residence                 Additional Comments: Unable to fully assess due to pt's aphasia. Pt has a caregiver but unsure how much caregiver is with pt.    Prior Function           Comments: Gait abnormality at baseline due to Huntington's but was independent. Pt denies use of assistive device but unsure if this is accurate.     Hand Dominance        Extremity/Trunk Assessment   Upper Extremity Assessment: Defer to OT evaluation           Lower Extremity Assessment: Difficult to assess due to impaired cognition;RLE deficits/detail (Pt unable to follow commands for muscle testing) RLE Deficits / Details: Functionally appears weaker than lt. Pt with some difficulty advancing during gait.       Communication   Communication: Receptive difficulties;Expressive difficulties  Cognition Arousal/Alertness: Awake/alert Behavior During Therapy: WFL for tasks assessed/performed Overall Cognitive Status: Difficult to assess  General Comments      Exercises        Assessment/Plan    PT Assessment Patient needs continued PT services  PT Diagnosis Difficulty walking;Abnormality of gait;Hemiplegia dominant side   PT Problem List Decreased strength;Decreased balance;Decreased mobility;Decreased knowledge of use of DME;Decreased knowledge of precautions  PT Treatment  Interventions DME instruction;Gait training;Functional mobility training;Stair training;Therapeutic activities;Therapeutic exercise;Patient/family education;Balance training;Neuromuscular re-education   PT Goals (Current goals can be found in the Care Plan section) Acute Rehab PT Goals Patient Stated Goal: Pt unable to state PT Goal Formulation: Patient unable to participate in goal setting Time For Goal Achievement: 12/01/14 Potential to Achieve Goals: Good    Frequency Min 4X/week   Barriers to discharge Decreased caregiver support Unsure of how much caregiver available.    Co-evaluation               End of Session Equipment Utilized During Treatment: Gait belt Activity Tolerance: Patient tolerated treatment well Patient left: in chair;with call bell/phone within reach;with chair alarm set Nurse Communication: Mobility status         Time: [id number] PT Time Calculation (min) (ACUTE ONLY): 24 min   Charges:   PT Evaluation $Initial PT Evaluation Tier I: 1 Procedure PT Treatments $Gait Training: 8-22 mins   PT G Codes:        Meir Elwood 11/17/2014, 9:12 AM  [facility] Karmin Kasprzak PT [phone]

## 2014-11-17 NOTE — Progress Notes (Signed)
OT Cancellation Note  Patient Details Name: Drue Stagerlonzo Carpino MRN: [id number] DOB: 05-Mar-1951   Cancelled Treatment:    Reason Eval/Treat Not Completed: Patient at procedure or test/ unavailable. Pt leaving floor with transport for procedure when OT arrived. OT will follow up with pt as available to complete evaluation.   Nena JordanMiller, Vietta Bonifield M   Carney LivingLeeAnn Marie Artis Buechele, OTR/L Occupational Therapist [phone] (pager)  11/17/2014, 2:26 PM

## 2014-11-17 NOTE — Progress Notes (Signed)
Occupational Therapy Evaluation Patient Details Name: Eric Waters Haller MRN: [id number] DOB: Jan 01, 1951 Today's Date: 11/17/2014    History of Present Illness Pt adm with aphasia and rt facial droop. CT neg and MRI pending. PMH - Huntington's Chorea   Clinical Impression   PTA pt lived alone and had assistance from a caregiver (PLOF unknown). Caregiver arrived at end of session and reports that pt was independent with ambulation but had gait abnormality and baseline. Pt currently requires min-mod (A) to stand (became fatigued by the end of the day) and requires assist with ADLs due to sequencing, decreased cognition, and decreased communication. Pt is highly motivated and would be an excellent candidate for CIR. Pt will continue to benefit from acute OT to address independence with ADLs.     Follow Up Recommendations  CIR;Supervision/Assistance - 24 hour    Equipment Recommendations  Other (comment) (TBD)    Recommendations for Other Services       Precautions / Restrictions Precautions Precautions: Fall Restrictions Weight Bearing Restrictions: No      Mobility Bed Mobility Overal bed mobility: Needs Assistance Bed Mobility: Supine to Sit;Sit to Supine     Supine to sit: Min assist;HOB elevated Sit to supine: Min assist   General bed mobility comments: Verbal/tactile/visual cues to initiate. Assist to move rt leg off of bed.  Transfers Overall transfer level: Needs assistance Equipment used: Rolling walker (2 wheeled) Transfers: Sit to/from Stand Sit to Stand: Min assist         General transfer comment: (A) to power up and to achieve balance in full standing. Tactile cues for hand placement.          ADL Overall ADL's : Needs assistance/impaired Eating/Feeding: NPO   Grooming: Wash/dry face;Set up;Sitting Grooming Details (indicate cue type and reason): pt indicated he was hot and provided with cool washrag to wipe his face Upper Body Bathing: Minimal  assitance;Sitting   Lower Body Bathing: Moderate assistance;Sit to/from stand   Upper Body Dressing : Moderate assistance;Sitting   Lower Body Dressing: Maximal assistance;Sit to/from stand   Toilet Transfer: Stand-pivot;RW;Minimal assistance             General ADL Comments: Pt having difficulty expressing himself due to aphasia. Able to answer yes to appropriate questions. Asked his name, pt required significant time and cueing with first letter before whispering "Lambert KetoAlonzo." Utilized communication sheet with pictures for pt to indicate needs. He pointed to "hot" and "bath" appropriately and emphatically said "yes" when asked if he was hot and wanted a bath. Educated pt's caregiver on use of communication cards and will try to get more for pt/family use.      Vision                 Additional Comments: vision to be further assessed as pt is able to follow more commands. No apparent deficits, however will continue to assess.   Perception Perception Perception Tested?: No   Praxis Praxis Praxis tested?: Within functional limits    Pertinent Vitals/Pain Pain Assessment: Faces Faces Pain Scale: No hurt     Hand Dominance Right   Extremity/Trunk Assessment Upper Extremity Assessment Upper Extremity Assessment: Difficult to assess due to impaired cognition   Lower Extremity Assessment Lower Extremity Assessment: Defer to PT evaluation       Communication Communication Communication: Receptive difficulties;Expressive difficulties   Cognition Arousal/Alertness: Awake/alert Behavior During Therapy: WFL for tasks assessed/performed Overall Cognitive Status: Difficult to assess  Home Living Family/patient expects to be discharged to:: Private residence Living Arrangements: Alone Available Help at Discharge: Family;Available PRN/intermittently (pt has caregiver (cousin's husband) who assists him)                                     Prior Functioning/Environment Level of Independence: Independent        Comments: pt reports he was independent with ADLs and gait, but has gait abnormality at baseline per chart.     OT Diagnosis: Generalized weakness;Cognitive deficits;Other (comment) (aphasia)   OT Problem List: Decreased strength;Decreased activity tolerance;Impaired balance (sitting and/or standing);Decreased cognition   OT Treatment/Interventions: Self-care/ADL training;Therapeutic exercise;Energy conservation;DME and/or AE instruction;Neuromuscular education;Therapeutic activities;Cognitive remediation/compensation;Patient/family education;Balance training    OT Goals(Current goals can be found in the care plan section) Acute Rehab OT Goals Patient Stated Goal: pt indicated he would like to bathe on communication board OT Goal Formulation: With patient Time For Goal Achievement: 12/01/14 Potential to Achieve Goals: Good ADL Goals Pt Will Perform Grooming: with supervision;with set-up;standing Pt Will Perform Upper Body Bathing: with set-up;with supervision;sitting Pt Will Perform Upper Body Dressing: with set-up;with supervision;sitting Pt Will Transfer to Toilet: with supervision;ambulating Pt Will Perform Toileting - Clothing Manipulation and hygiene: with supervision;sit to/from stand Additional ADL Goal #1: Pt will Independently verbally identify 2 ADL tasks to complete daily.   OT Frequency: Min 3X/week    End of Session Equipment Utilized During Treatment: Gait belt;Rolling walker;Other (comment) Retail banker(communication board (pictures)) Nurse Communication: Other (comment) (communication board for use with pt)  Activity Tolerance: Patient tolerated treatment well Patient left: in bed;with call bell/phone within reach;with bed alarm set;with family/visitor present   Time: 1701-1729 OT Time Calculation (min): 28 min Charges:  OT General Charges $OT Visit: 1 Procedure OT  Evaluation $Initial OT Evaluation Tier I: 1 Procedure OT Treatments $Self Care/Home Management : 8-22 mins G-Codes:    Nena JordanMiller, Kyllie Pettijohn M 11/17/2014, 5:52 PM   Yehuda MaoLeeAnn Guido SanderMarie Tasha Jindra, OTR/L Occupational Therapist [phone] (pager)

## 2014-11-17 NOTE — Progress Notes (Signed)
[facility] PROGRESS NOTE  Eric Waters RUE:[id number] DOB: 1951/10/02 DOA: 11/16/2014  PCP: Rene PaciValerie Leschber, MD  Brief HPI: [age] year old male with a history of Huntington's chorea, abnormal gait secondary to same, who presented with the complaints of difficulty speaking. There was also concern about right facial droop. Patient was admitted to the hospital for further workup.  Past medical history:  Past Medical History  Diagnosis Date  . Allergy   . GERD (gastroesophageal reflux disease)   . Huntington chorea     [facility] Shar[facility] [phone] Eric Canes(Christine)  . Chronic diarrhea     Consultants: Neurology  Procedures:  Carotid Doppler. Bilateral: 1-39% ICA stenosis. Vertebral artery flow is antegrade bilaterally, however the right vertebral is atypical with loss of diastolic component, suggesting a distal obstruction.   2-D echocardiogram. Pending  Antibiotics: None  Subjective: Patient still has difficulty talking. Denies any pain.  Objective: Vital Signs  Filed Vitals:   11/17/14 0000 11/17/14 0200 11/17/14 0400 11/17/14 0600  BP: 121/78 118/75 115/73 132/59  Pulse: 57 55 57 58  Temp: 97.7 F (36.5 C) 97.9 F (36.6 C) 97.7 F (36.5 C) 97.8 F (36.6 C)  TempSrc: Oral Oral Oral Oral  Resp: 16 16 16 16   Weight:      SpO2: 98% 100% 98% 100%   No intake or output data in the 24 hours ending 11/17/14 0853 Filed Weights   11/16/14 1804  Weight: 68.901 kg (151 lb 14.4 oz)    General appearance: alert, appears stated age, distracted and Not fully cooperative Head: Normocephalic, without obvious abnormality, atraumatic Resp: clear to auscultation bilaterally Cardio: regular rate and rhythm, S1, S2 normal, no murmur, click, rub or gallop GI: soft, non-tender; bowel sounds normal; no masses,  no organomegaly Extremities: extremities normal, atraumatic, no cyanosis or edema Right facial droop is noted. Possible right-sided  weakness. Although it was difficult to examine due to lack of cooperation.  Lab Results:  Basic Metabolic Panel:  Recent Labs Lab 11/16/14 1420 11/16/14 1432  NA 136 139  K 4.3 4.3  CL 104 104  CO2 22  --   GLUCOSE 114* 112*  BUN 9 10  CREATININE 1.33 1.10  CALCIUM 9.2  --    Liver Function Tests:  Recent Labs Lab 11/16/14 1420  AST 20  ALT 12  ALKPHOS 121*  BILITOT 0.8  PROT 6.9  ALBUMIN 3.7   CBC:  Recent Labs Lab 11/16/14 1420 11/16/14 1432  WBC 6.3  --   NEUTROABS 4.6  --   HGB 14.2 16.3  HCT 42.3 48.0  MCV 87.0  --   PLT 236  --    CBG:  Recent Labs Lab 11/16/14 1411  GLUCAP 102*    Studies/Results: Dg Chest 2 View  11/16/2014   CLINICAL DATA:  Stroke  EXAM: CHEST  2 VIEW  COMPARISON:  04/08/2013  FINDINGS: Normal heart size. Clear lungs. No pneumothorax. No pleural effusion.  IMPRESSION: No active cardiopulmonary disease.   Electronically Signed   By: Maryclare BeanArt  Hoss M.D.   On: 11/16/2014 19:33   Ct Head Wo Contrast  11/16/2014   CLINICAL DATA:  Right facial droop  EXAM: CT HEAD WITHOUT CONTRAST  TECHNIQUE: Contiguous axial images were obtained from the base of the skull through the vertex without intravenous contrast.  COMPARISON:  None.  FINDINGS: Global atrophy. Extensive chronic ischemic changes in the periventricular white matter and basal ganglia. No mass effect, midline shift, or acute intracranial hemorrhage. No evidence  of hyperdense MCA. Partial opacification of left posterior ethmoid air cells.  IMPRESSION: No acute intracranial pathology.  Chronic changes are noted.   Electronically Signed   By: Maryclare BeanArt  Hoss M.D.   On: 11/16/2014 15:45    Medications:  Scheduled: . aspirin  300 mg Rectal Daily  . enoxaparin (LOVENOX) injection  40 mg Subcutaneous Q24H   Continuous: . 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 Patient was seen by neurology. Stroke workup is in progress. Still waiting for MRI, MRA brain. Echocardiogram is pending. LDL is 151. He will need to be started on a statin once he is able to take orally. Await speech therapy evaluation. Continue aspirin for now. Await PT and OT.  Stage III chronic kidney disease Creatinine probably at baseline. Follow BMP  History of Huntington's chorea Stable. [facility].  History of recurrent major depression Prozac and Haldol currently held secondary to nothing by mouth status. Resume when able.   DVT Prophylaxis: Lovenox    Code Status: Full code  Family Communication: No family at bedside  Disposition Plan: CIR consulted    LOS: 1 day   Pali Momi Medical CenterKRISHNAN,Vernell Townley  [facility] Pager [phone] 11/17/2014, 8:53 AM  If 7PM-7AM, please contact night-coverage at [email or web address], password [email or web address]

## 2014-11-17 NOTE — Progress Notes (Signed)
STROKE TEAM PROGRESS NOTE   HISTORY Eric Waters is a 63 y.o. male with a history of Huntington's disease who presents with aphasia that is new today. His cousin who is with him states that he seemed in his normal state yesterday, but today he checked on him and found him to not be doing his usual routine. He therefore went over to check on him and person, and found him to not be speaking. On arrival here he has a dense aphasia as well as a right facial droop. Neurology was consulted for further evaluation and management   LKW: 11/15/14 7 PM tpa given?: no, outside of window   SUBJECTIVE (INTERVAL HISTORY) His family is not is at the bedside.  Patient is non-verbal so cannot assess how he feels today   OBJECTIVE Temp:  [97.7 F (36.5 C)-98.6 F (37 C)] 97.9 F (36.6 C) (12/26 1055) Pulse Rate:  [53-69] 69 (12/26 1055) Cardiac Rhythm:  [-] Normal sinus rhythm (12/26 0832) Resp:  [14-23] 18 (12/26 1055) BP: (110-132)/(59-82) 125/74 mmHg (12/26 1055) SpO2:  [96 %-100 %] 100 % (12/26 1055) Weight:  [151 lb 14.4 oz (68.901 kg)] 151 lb 14.4 oz (68.901 kg) (12/25 1804)   Recent Labs Lab 11/16/14 1411  GLUCAP 102*    Recent Labs Lab 11/16/14 1420 11/16/14 1432  NA 136 139  K 4.3 4.3  CL 104 104  CO2 22  --   GLUCOSE 114* 112*  BUN 9 10  CREATININE 1.33 1.10  CALCIUM 9.2  --     Recent Labs Lab 11/16/14 1420  AST 20  ALT 12  ALKPHOS 121*  BILITOT 0.8  PROT 6.9  ALBUMIN 3.7    Recent Labs Lab 11/16/14 1420 11/16/14 1432  WBC 6.3  --   NEUTROABS 4.6  --   HGB 14.2 16.3  HCT 42.3 48.0  MCV 87.0  --   PLT 236  --    No results for input(s): CKTOTAL, CKMB, CKMBINDEX, TROPONINI in the last 168 hours.  Recent Labs  11/16/14 1420  LABPROT 14.5  INR 1.12   No results for input(s): COLORURINE, LABSPEC, PHURINE, GLUCOSEU, HGBUR, BILIRUBINUR, KETONESUR, PROTEINUR, UROBILINOGEN, NITRITE, LEUKOCYTESUR in the last 72 hours.  Invalid input(s): APPERANCEUR      Component Value Date/Time   CHOL 214* 11/17/2014 0555   TRIG 106 11/17/2014 0555   HDL 42 11/17/2014 0555   CHOLHDL 5.1 11/17/2014 0555   VLDL 21 11/17/2014 0555   LDLCALC 151* 11/17/2014 0555   LDLCALC 19 09/24/2009   Lab Results  Component Value Date   HGBA1C 5.8* 04/09/2013      Component Value Date/Time   LABOPIA NONE DETECTED 05/06/2012 2138   COCAINSCRNUR NONE DETECTED 05/06/2012 2138   LABBENZ NONE DETECTED 05/06/2012 2138   AMPHETMU NONE DETECTED 05/06/2012 2138   THCU NONE DETECTED 05/06/2012 2138   LABBARB NONE DETECTED 05/06/2012 2138     Recent Labs Lab 11/16/14 1420  ETH <5    Dg Chest 2 View 11/16/2014    No active cardiopulmonary disease.    Ct Head Wo Contrast 11/16/2014    No acute intracranial pathology.  Chronic changes are noted.     PHYSICAL EXAM Physical exam: Exam: Gen: NAD Eyes: anicteric sclerae, moist conjunctivae                    CV: no MRG Mental Status: Alert, not answering questions. Follows some commands with visual cues.   Neuro: Detailed Neurologic Exam  Speech:  Non verbal  Cranial Nerves:    The pupils are equal, round, and reactive to light. Fundi not visualized.  EOMI. Conjugate gaze, No gaze preference.  Right facial droop.   Motor Observation:    Right arm tremor noted. Increased tone throughout.      Strength:    Difficult to perform full motor exam due to cognitive deficits. Right leg with drift, all other extremities sustained anti-gravity.      Sensation:  Withdraws to painful stim  DTRs/Clonus: 6 beats clonus left and 2 beats clonus right on ankle jerks. Hyperreflexia throughout.   Plantars right upgoing, left withdrawal.   Gait: Unable to test  Coordination: Unable to test      ASSESSMENT/PLAN Mr. Eric Waters is a 63 y.o. male with history of Huntington's disease presenting with aphasia. He did not receive IV t-PA as he was outside the window for therapeutic treatment.   Resultant  Right  facial droop and right-sided weakness leg > arm  MRI  pending  MRA  pending  Carotid Doppler pending  2D Echo  pending  LDL 151  HgbA1c pending  Lovenox for VTE prophylaxis  Diet NPO time specified no liquids  no antithrombotic prior to admission, now on aspirin 300 mg suppository daily  Ongoing aggressive stroke risk factor management  Therapy recommendations:  Pending  Disposition:  Pending  Hypertension  Home meds:  None  Stable  Hyperlipidemia  Home meds:  No lipid lowering medications prior to admission.  LDL 151, goal < 70  Add - statin when taking PO meds  Continue statin at discharge  Diabetes  HgbA1c pending goal < 7.0  Controlled  Other Stroke Risk Factors  Advanced age  Family hx stroke (maternal uncle)    Personally examined patient and images, and have documentes history, physical, neuro exam,assessment and plan as stated above.   Naomie DeanAntonia Jodeci Rini, MD Stroke Neurology [phone] [facility]       To contact Stroke Continuity provider, please refer to [email or web address]. After hours, contact General Neurology

## 2014-11-18 ENCOUNTER — Inpatient Hospital Stay (HOSPITAL_COMMUNITY): Payer: Medicare Other

## 2014-11-18 DIAGNOSIS — I63512 Cerebral infarction due to unspecified occlusion or stenosis of left middle cerebral artery: Secondary | ICD-10-CM

## 2014-11-18 LAB — BASIC METABOLIC PANEL
Anion gap: 9 (ref 5–15)
BUN: 12 mg/dL (ref 6–23)
CALCIUM: 8.9 mg/dL (ref 8.4–10.5)
CO2: 23 mmol/L (ref 19–32)
Chloride: 108 mEq/L (ref 96–112)
Creatinine, Ser: 1.13 mg/dL (ref 0.50–1.35)
GFR, EST AFRICAN AMERICAN: 78 mL/min — AB (ref >90)
GFR, EST NON AFRICAN AMERICAN: 67 mL/min — AB (ref >90)
Glucose, Bld: 77 mg/dL (ref 70–99)
POTASSIUM: 4 mmol/L (ref 3.5–5.1)
Sodium: 140 mmol/L (ref 135–145)

## 2014-11-18 LAB — CBC
HEMATOCRIT: 40.3 % (ref 39.0–52.0)
Hemoglobin: 13.4 g/dL (ref 13.0–17.0)
MCH: 29.3 pg (ref 26.0–34.0)
MCHC: 33.3 g/dL (ref 30.0–36.0)
MCV: 88 fL (ref 78.0–100.0)
Platelets: 261 10*3/uL (ref 150–400)
RBC: 4.58 MIL/uL (ref 4.22–5.81)
RDW: 13.2 % (ref 11.5–15.5)
WBC: 5 10*3/uL (ref 4.0–10.5)

## 2014-11-18 MED ORDER — RESOURCE THICKENUP CLEAR PO POWD
ORAL | Status: DC | PRN
  Filled 2014-11-18: qty 125

## 2014-11-18 NOTE — Progress Notes (Signed)
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 Patient was seen by neurology. MRI, MRA brain with L MCA M1 occlusion with patchy acute infarcts in the L MCA territory. Echocardiogram with EF of 65-70%. LDL is 151. He will need to be started on a statin once he is able to take orally. SLP recs for dysphagia 1 diet with honey thick liquids. Continue aspirin for now. PT and OT recs for CIR  Stage III chronic kidney disease Creatinine probably at baseline. Follow BMP  History of Huntington's chorea Stable. Follows with Dr. Boris Sharper at [facility].  History of recurrent major depression Prozac and Haldol currently held secondary to nothing by mouth status. Resume when able.   Code Status: full Family Communication:  Pt in room (indicate person spoken with, relationship, and if by phone, the number) Disposition Plan: Pending   Consultants:  Neurology  Procedures:    Antibiotics:   (indicate start date, and stop date if known)  HPI/Subjective: No acute events noted overnight  Objective: Filed Vitals:   11/18/14 0213 11/18/14 0734 11/18/14 1015 11/18/14 1352  BP: 124/74 130/70 128/75 116/66  Pulse: 61 64 67 86  Temp: 98.1 F (36.7 C) 98.1 F (36.7 C) 97.7 F (36.5 C) 97.8 F (36.6 C)  TempSrc: Oral Oral Oral Oral  Resp: 18 18 18 18   Weight:      SpO2: 100% 100% 99% 100%    Intake/Output Summary (Last 24 hours) at 11/18/14 1359 Last data filed at 11/18/14 1300  Gross per 24 hour  Intake    120 ml  Output      0 ml  Net    120 ml   Filed Weights   11/16/14 1804  Weight: 68.901 kg (151 lb 14.4 oz)    Exam:   General:  Awake, in nad  Cardiovascular: regular, s1, s2  Respiratory: normal resp effort, no wheezing  Abdomen: soft, nondistended  Musculoskeletal: perfused, no clubbing    Data Reviewed: Basic Metabolic Panel:  Recent Labs Lab 11/16/14 1420 11/16/14 1432 11/18/14 0421  NA 136 139 140  K 4.3 4.3 4.0  CL 104 104 108  CO2 22  --  23  GLUCOSE 114* 112* 77  BUN 9 10 12   CREATININE 1.33 1.10 1.13  CALCIUM 9.2  --  8.9   Liver Function Tests:  Recent Labs Lab 11/16/14 1420  AST 20  ALT 12  ALKPHOS 121*  BILITOT 0.8  PROT 6.9  ALBUMIN 3.7   No results for input(s): LIPASE, AMYLASE in the last 168 hours. No results for input(s): AMMONIA in the last 168 hours. CBC:  Recent Labs Lab 11/16/14 1420 11/16/14 1432 11/18/14 0421  WBC 6.3  --  5.0  NEUTROABS 4.6  --   --   HGB 14.2 16.3 13.4  HCT 42.3 48.0 40.3  MCV 87.0  --  88.0  PLT 236  --  261   Cardiac Enzymes: No results for input(s): CKTOTAL, CKMB, CKMBINDEX, TROPONINI in the last 168 hours. BNP (last 3 results) No results for input(s): PROBNP in the last 8760 hours. CBG:  Recent Labs Lab 11/16/14 1411  GLUCAP 102*    No results found for this or any previous visit (from the past 240 hour(s)).   Studies: Dg Chest 2 View  11/16/2014   CLINICAL DATA:  Stroke  EXAM: CHEST  2  VIEW  COMPARISON:  04/08/2013  FINDINGS: Normal heart size. Clear lungs. No pneumothorax. No pleural effusion.  IMPRESSION: No active cardiopulmonary disease.   Electronically Signed   By: Maryclare BeanArt  Hoss M.D.   On: 11/16/2014 19:33   Ct Head Wo Contrast  11/16/2014   CLINICAL DATA:  Right facial droop  EXAM: CT HEAD WITHOUT CONTRAST  TECHNIQUE: Contiguous axial images were obtained from the base of the skull through the vertex without intravenous contrast.  COMPARISON:  None.  FINDINGS: Global atrophy. Extensive chronic ischemic changes in the periventricular white matter and basal ganglia. No mass effect, midline shift, or acute intracranial hemorrhage. No evidence of hyperdense MCA. Partial opacification of left posterior ethmoid air cells.  IMPRESSION: No acute intracranial pathology.  Chronic changes are  noted.   Electronically Signed   By: Maryclare BeanArt  Hoss M.D.   On: 11/16/2014 15:45   Mr Brain Wo Contrast  11/17/2014   ADDENDUM REPORT: 11/17/2014 16:36  ADDENDUM: Study discussed by telephone with Dr. Osvaldo ShipperGokul Krishnan on 11/17/2014 at 16:34 .   Electronically Signed   By: Augusto GambleLee  Hall M.D.   On: 11/17/2014 16:36   11/17/2014   CLINICAL DATA:  63 year old male with severe confusion, loss of speech and right facial droop. Initial encounter. Current history of Huntington chorea.  EXAM: MRI HEAD WITHOUT CONTRAST  MRA HEAD WITHOUT CONTRAST  TECHNIQUE: Multiplanar, multiecho pulse sequences of the brain and surrounding structures were obtained without intravenous contrast. Angiographic images of the head were obtained using MRA technique without contrast.  COMPARISON:  Head CT without contrast 11/16/2014.  FINDINGS: MRI HEAD FINDINGS  The examination had to be discontinued prior to completion due to patient agitation.  Scattered Patchy and confluent areas of restricted diffusion in the left hemisphere, with confluent involvement of the left basal ganglia and posterior left temporal lobe (series 3).  No contralateral or posterior fossa restricted diffusion.  Cerebral volume loss with probable ex vacuo ventricular enlargement. No midline shift. T2 hyperintensity in the acutely affected areas compatible with cytotoxic edema. No intracranial mass effect.  MRA HEAD FINDINGS  Study is moderately degraded by motion artifact despite repeated imaging attempts.  Antegrade flow in the posterior circulation which appears to be supplied via the left vertebral artery. The basilar artery is patent. The PCA origins are patent.  Antegrade flow in both ICA siphons. Both carotid termini are patent. The right MCA M1 segment is patent, but the left M1 segment appears occluded about 8 mm beyond its origin. Both ACA origins are patent.  IMPRESSION: 1. Truncated and motion degraded exam due to patient agitation. 2. Left MCA M1 segment occlusion with  patchy acute infarcts in the left MCA territory. No evidence of hemorrhage or mass effect at this time.  Electronically Signed: By: Augusto GambleLee  Hall M.D. On: 11/17/2014 16:31   Dg Swallowing Func-speech Pathology  11/18/2014   Breck CoonsLisa Willis RivervaleLitaker, CCC-SLP     11/18/2014 11:55 AM Objective Swallowing Evaluation: Modified Barium Swallowing Study   Patient Details  Name: Eric Waters MRN: [id number] Date of Birth: [birth year]  Today's Date: 11/18/2014 Time: 4098-11911113-1131 SLP Time Calculation (min) (ACUTE ONLY): 18 min  Past Medical History:  Past Medical History  Diagnosis Date  . Allergy   . GERD (gastroesophageal reflux disease)   . Huntington chorea     [facility] Boris Sharper[facility] Walker [phone]  [name]  . Chronic diarrhea    Past Surgical History:  Past Surgical History  Procedure Laterality Date  . Tonsillectomy  55   HPI:  63 y.o. male with history of Huntington's Chorea, GERD admitted  with inability to speak. MRI Left MCA M1 segment occlusion with  patchy acute infarcts in the left MCA territory.  BSE recommended  MBS.     Assessment / Plan / Recommendation Clinical Impression  Dysphagia Diagnosis: Moderate oral phase dysphagia;Moderate  pharyngeal phase dysphagia Clinical impression: Pt. exhbited moderate oral and pharyngeal  dysphagia with sensory > motor impairments.  Decreased lingual  strength led to moderate oral residue spilling to valleculae and  filling pyriform sinuses frequently throughout study with  decreased awareness. Laryngeal penetration to vocal cords with  cup sip nectar (large sip despite frequent therapeutic cues for  small sips). Mild-moderate pyriform sinsus residue due to  decreased laryngeal elevation.  Nectar thick consistency poses a  greater aspiration risk and SLP recommends honey thick liquids  and Dys 1 texture, 2 swallows, SMALL sips and bites, full  supervision due to cognitive impairments and pills crushed.  ST  will continue to follow.     Treatment Recommendation   Therapy as outlined in treatment plan below    Diet Recommendation Dysphagia 1 (Puree);Honey-thick liquid   Liquid Administration via: Cup;No straw Medication Administration: Crushed with puree Supervision: Patient able to self feed;Staff to assist with self  feeding;Full supervision/cueing for compensatory strategies Compensations: Slow rate;Small sips/bites;Multiple dry swallows  after each bite/sip;Check for pocketing Postural Changes and/or Swallow Maneuvers: Seated upright 90  degrees;Upright 30-60 min after meal    Other  Recommendations Recommended Consults: MBS Oral Care Recommendations: Oral care BID Other Recommendations: Order thickener from pharmacy   Follow Up Recommendations   (TBD)    Frequency and Duration min 2x/week  2 weeks   Pertinent Vitals/Pain No evidence pain         Reason for Referral Objectively evaluate swallowing function   Oral Phase Oral Preparation/Oral Phase Oral Phase: Impaired Oral - Honey Oral - Honey Teaspoon: Delayed oral transit;Weak lingual  manipulation Oral - Honey Cup: Weak lingual manipulation;Delayed oral transit Oral - Nectar Oral - Nectar Teaspoon: Weak lingual manipulation;Reduced  posterior propulsion;Lingual/palatal residue Oral - Nectar Cup: Weak lingual manipulation;Reduced posterior  propulsion;Lingual/palatal residue Oral - Solids Oral - Regular: Delayed oral transit;Weak lingual  manipulation;Impaired mastication   Pharyngeal Phase Pharyngeal Phase Pharyngeal Phase: Impaired Pharyngeal - Honey Pharyngeal - Honey Teaspoon: Within functional limits Pharyngeal - Honey Cup: Pharyngeal residue - valleculae;Reduced  tongue base retraction (min-mild) Pharyngeal - Nectar Pharyngeal - Nectar Teaspoon: Delayed swallow  initiation;Premature spillage to valleculae Pharyngeal - Nectar Cup: Penetration/Aspiration during  swallow;Pharyngeal residue - pyriform sinuses;Reduced laryngeal  elevation Penetration/Aspiration details (nectar cup): Material enters  airway, CONTACTS  cords and not ejected out Pharyngeal - Thin Pharyngeal - Thin Teaspoon: Delayed swallow initiation;Premature  spillage to pyriform sinuses Pharyngeal - Solids Pharyngeal - Puree: Delayed swallow initiation;Premature spillage  to valleculae Pharyngeal - Regular: Within functional limits  Cervical Esophageal Phase    GO    Cervical Esophageal Phase Cervical Esophageal Phase: Leonarda Salon         Royce Macadamia 11/18/2014, 11:54 AM  [phone]    Mr Maxine Glenn Head/brain Wo Cm  11/17/2014   ADDENDUM REPORT: 11/17/2014 16:36  ADDENDUM: Study discussed by telephone with Dr. Osvaldo Shipper on 11/17/2014 at 16:34 .   Electronically Signed   By: Augusto Gamble M.D.   On: 11/17/2014 16:36   11/17/2014   CLINICAL DATA:  63 year old male with severe confusion, loss of speech and right facial droop. Initial encounter.  Current history of Huntington chorea.  EXAM: MRI HEAD WITHOUT CONTRAST  MRA HEAD WITHOUT CONTRAST  TECHNIQUE: Multiplanar, multiecho pulse sequences of the brain and surrounding structures were obtained without intravenous contrast. Angiographic images of the head were obtained using MRA technique without contrast.  COMPARISON:  Head CT without contrast 11/16/2014.  FINDINGS: MRI HEAD FINDINGS  The examination had to be discontinued prior to completion due to patient agitation.  Scattered Patchy and confluent areas of restricted diffusion in the left hemisphere, with confluent involvement of the left basal ganglia and posterior left temporal lobe (series 3).  No contralateral or posterior fossa restricted diffusion.  Cerebral volume loss with probable ex vacuo ventricular enlargement. No midline shift. T2 hyperintensity in the acutely affected areas compatible with cytotoxic edema. No intracranial mass effect.  MRA HEAD FINDINGS  Study is moderately degraded by motion artifact despite repeated imaging attempts.  Antegrade flow in the posterior circulation which appears to be supplied via the left vertebral artery. The  basilar artery is patent. The PCA origins are patent.  Antegrade flow in both ICA siphons. Both carotid termini are patent. The right MCA M1 segment is patent, but the left M1 segment appears occluded about 8 mm beyond its origin. Both ACA origins are patent.  IMPRESSION: 1. Truncated and motion degraded exam due to patient agitation. 2. 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 No evidence of hemorrhage or mass effect at this time.  Electronically Signed: By: Augusto GambleLee  Hall M.D. On: 11/17/2014 16:31    Scheduled Meds: . aspirin  300 mg Rectal Daily  . enoxaparin (LOVENOX) injection  40 mg Subcutaneous Q24H   Continuous Infusions: . sodium chloride 50 mL/hr at 11/16/14 [id number]    Active Problems:   HUNTINGTONS CHOREA   Major depressive disorder, recurrent   CKD (chronic kidney disease), stage III   CVA (cerebral infarction)   Aphasia    Time spent: 20min    CHIU, STEPHEN K  [facility] Pager [phone]. If 7PM-7AM, please contact night-coverage at [email or web address], password [email or web address] 11/18/2014, 1:59 PM  LOS: 2 days

## 2014-11-18 NOTE — Procedures (Signed)
Objective Swallowing Evaluation: Modified Barium Swallowing Study  Patient Details  Name: Eric Waters MRN: [id number] Date of Birth: 1951-08-10  Today's Date: 11/18/2014 Time: 4098-11911113-1131 SLP Time Calculation (min) (ACUTE ONLY): 18 min  Past Medical History:  Past Medical History  Diagnosis Date  . Allergy   . GERD (gastroesophageal reflux disease)   . Huntington chorea     [facility] Boris SharperFrancis Walker [phone] [name]  . Chronic diarrhea    Past Surgical History:  Past Surgical History  Procedure Laterality Date  . Tonsillectomy  1959   HPI:  63 y.o. male with history of Huntington's Chorea, GERD admitted with inability to speak. MRI Left MCA M1 segment occlusion with patchy acute infarcts in the left MCA territory.  BSE recommended MBS.     Assessment / Plan / Recommendation Clinical Impression  Dysphagia Diagnosis: Moderate oral phase dysphagia;Moderate pharyngeal phase dysphagia Clinical impression: Pt. exhbited moderate oral and pharyngeal dysphagia with sensory > motor impairments.  Decreased lingual strength led to moderate oral residue spilling to valleculae and filling pyriform sinuses frequently throughout study with decreased awareness. Laryngeal penetration to vocal cords with cup sip nectar (large sip despite frequent therapeutic cues for small sips). Mild-moderate pyriform sinsus residue due to decreased laryngeal elevation.  Nectar thick consistency poses a greater aspiration risk and SLP recommends honey thick liquids and Dys 1 texture, 2 swallows, SMALL sips and bites, full supervision due to cognitive impairments and pills crushed.  ST will continue to follow.     Treatment Recommendation  Therapy as outlined in treatment plan below    Diet Recommendation Dysphagia 1 (Puree);Honey-thick liquid   Liquid Administration via: Cup;No straw Medication Administration: Crushed with puree Supervision: Patient able to self feed;Staff to assist with  self feeding;Full supervision/cueing for compensatory strategies Compensations: Slow rate;Small sips/bites;Multiple dry swallows after each bite/sip;Check for pocketing Postural Changes and/or Swallow Maneuvers: Seated upright 90 degrees;Upright 30-60 min after meal    Other  Recommendations Recommended Consults: MBS Oral Care Recommendations: Oral care BID Other Recommendations: Order thickener from pharmacy   Follow Up Recommendations   (TBD)    Frequency and Duration min 2x/week  2 weeks   Pertinent Vitals/Pain No evidence pain         Reason for Referral Objectively evaluate swallowing function   Oral Phase Oral Preparation/Oral Phase Oral Phase: Impaired Oral - Honey Oral - Honey Teaspoon: Delayed oral transit;Weak lingual manipulation Oral - Honey Cup: Weak lingual manipulation;Delayed oral transit Oral - Nectar Oral - Nectar Teaspoon: Weak lingual manipulation;Reduced posterior propulsion;Lingual/palatal residue Oral - Nectar Cup: Weak lingual manipulation;Reduced posterior propulsion;Lingual/palatal residue Oral - Solids Oral - Regular: Delayed oral transit;Weak lingual manipulation;Impaired mastication   Pharyngeal Phase Pharyngeal Phase Pharyngeal Phase: Impaired Pharyngeal - Honey Pharyngeal - Honey Teaspoon: Within functional limits Pharyngeal - Honey Cup: Pharyngeal residue - valleculae;Reduced tongue base retraction (min-mild) Pharyngeal - Nectar Pharyngeal - Nectar Teaspoon: Delayed swallow initiation;Premature spillage to valleculae Pharyngeal - Nectar Cup: Penetration/Aspiration during swallow;Pharyngeal residue - pyriform sinuses;Reduced laryngeal elevation Penetration/Aspiration details (nectar cup): Material enters airway, CONTACTS cords and not ejected out Pharyngeal - Thin Pharyngeal - Thin Teaspoon: Delayed swallow initiation;Premature spillage to pyriform sinuses Pharyngeal - Solids Pharyngeal - Puree: Delayed swallow initiation;Premature spillage to  valleculae Pharyngeal - Regular: Within functional limits  Cervical Esophageal Phase    GO    Cervical Esophageal Phase Cervical Esophageal Phase: Leonarda SalonWFL         Royce MacadamiaLitaker, Brendaliz Kuk Willis 11/18/2014, 11:54 AM  [phone]

## 2014-11-18 NOTE — Progress Notes (Signed)
STROKE TEAM PROGRESS NOTE   HISTORY Eric Waters is a 63 y.o. male with a history of Huntington's disease who presents with aphasia that is new today. His cousin who is with him states that he seemed in his normal state yesterday, but today he checked on him and found him to not be doing his usual routine. He therefore went over to check on him and person, and found him to not be speaking. On arrival here he has a dense aphasia as well as a right facial droop. Neurology was consulted for further evaluation and management   LKW: 11/15/14 7 PM tpa given?: no, outside of window   SUBJECTIVE (INTERVAL HISTORY) His family is not is at the bedside.  Patient is non-verbal so cannot assess how he feels today   OBJECTIVE Temp:  [97.7 F (36.5 C)-99 F (37.2 C)] 97.7 F (36.5 C) (12/27 1015) Pulse Rate:  [60-77] 67 (12/27 1015) Cardiac Rhythm:  [-] Normal sinus rhythm (12/27 0816) Resp:  [18] 18 (12/27 1015) BP: (119-130)/(70-82) 128/75 mmHg (12/27 1015) SpO2:  [98 %-100 %] 99 % (12/27 1015)   Recent Labs Lab 11/16/14 1411  GLUCAP 102*    Recent Labs Lab 11/16/14 1420 11/16/14 1432 11/18/14 0421  NA 136 139 140  K 4.3 4.3 4.0  CL 104 104 108  CO2 22  --  23  GLUCOSE 114* 112* 77  BUN 9 10 12   CREATININE 1.33 1.10 1.13  CALCIUM 9.2  --  8.9    Recent Labs Lab 11/16/14 1420  AST 20  ALT 12  ALKPHOS 121*  BILITOT 0.8  PROT 6.9  ALBUMIN 3.7    Recent Labs Lab 11/16/14 1420 11/16/14 1432 11/18/14 0421  WBC 6.3  --  5.0  NEUTROABS 4.6  --   --   HGB 14.2 16.3 13.4  HCT 42.3 48.0 40.3  MCV 87.0  --  88.0  PLT 236  --  261   No results for input(s): CKTOTAL, CKMB, CKMBINDEX, TROPONINI in the last 168 hours.  Recent Labs  11/16/14 1420  LABPROT 14.5  INR 1.12   No results for input(s): COLORURINE, LABSPEC, PHURINE, GLUCOSEU, HGBUR, BILIRUBINUR, KETONESUR, PROTEINUR, UROBILINOGEN, NITRITE, LEUKOCYTESUR in the last 72 hours.  Invalid input(s): APPERANCEUR      Component Value Date/Time   CHOL 214* 11/17/2014 0555   TRIG 106 11/17/2014 0555   HDL 42 11/17/2014 0555   CHOLHDL 5.1 11/17/2014 0555   VLDL 21 11/17/2014 0555   LDLCALC 151* 11/17/2014 0555   LDLCALC 19 09/24/2009   Lab Results  Component Value Date   HGBA1C 6.1* 11/17/2014      Component Value Date/Time   LABOPIA NONE DETECTED 05/06/2012 2138   COCAINSCRNUR NONE DETECTED 05/06/2012 2138   LABBENZ NONE DETECTED 05/06/2012 2138   AMPHETMU NONE DETECTED 05/06/2012 2138   THCU NONE DETECTED 05/06/2012 2138   LABBARB NONE DETECTED 05/06/2012 2138     Recent Labs Lab 11/16/14 1420  ETH <5    Dg Chest 2 View 11/16/2014    No active cardiopulmonary disease.    Ct Head Wo Contrast 11/16/2014    No acute intracranial pathology.  Chronic changes are noted.     EEG 11/17/2014 EEG Abnormalities: 1) left temporoparietal delta activity Clinical Interpretation: This EEG is consistent with a left frontotemporal region of cerebral dysfunction. There was no seizure or seizure predisposition recorded on this study.    MRI / MRA Brain 11/17/2014 1. Truncated and motion degraded exam due to  patient agitation. 2. 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 No evidence of hemorrhage or mass effect at this time.  Carotid duplex 11/17/2014 - Findings consistent with 1- 39 percent stenosis involving the right internal carotid artery and the left internal carotid artery. - Antegrade vertebral flow bilaterally, however the right vertebral is atypical with loss of diastolic component, suggesting a distal obstruction. - ICA/CCA ratio. right = 0.66. left = 0.67.      PHYSICAL EXAM: No change from previous day Physical exam: Exam: Gen: NAD Eyes: anicteric sclerae, moist conjunctivae                    CV: no MRG Mental Status: Alert, not answering questions. Follows some commands with visual cues.   Neuro: Detailed Neurologic  Exam  Speech:    Non verbal  Cranial Nerves:    The pupils are equal, round, and reactive to light. Fundi not visualized.  EOMI. Conjugate gaze, No gaze preference.  Right facial droop.   Motor Observation:    Right arm tremor noted. Increased tone throughout.      Strength:    Difficult to perform full motor exam due to cognitive deficits. Right leg with drift, all other extremities sustained anti-gravity.      Sensation:  Withdraws to painful stim  DTRs/Clonus: 6 beats clonus left and 2 beats clonus right on ankle jerks. Hyperreflexia throughout.   Plantars right upgoing, left withdrawal.   Gait: Unable to test  Coordination: Unable to test      ASSESSMENT/PLAN Mr. Eric Waters is a 63 y.o. male with history of Huntington's disease presenting with aphasia. He did not receive IV t-PA as he was outside the window for therapeutic treatment.   Resultant  Right facial droop and right-sided weakness leg > arm  MRI -  Left MCA M1 segment occlusion with patchy acute infarcts in the left MCA territory.   MRA  - as above  Carotid Doppler - question distal obstruction right vertebral artery ( see above )   2D Echo - EF 65-70%. No cardiac source of emboli identified.  EEG - As above  LDL 151  HgbA1c - 6.1  Lovenox for VTE prophylaxis  DIET - DYS 1 no liquids  no antithrombotic prior to admission, now on aspirin 300 mg suppository daily.   Ongoing aggressive stroke risk factor management  Therapy recommendations:  CIR recommended  Disposition:  Pending  Hypertension  Home meds:  None  Stable  Hyperlipidemia  Home meds:  No lipid lowering medications prior to admission.  LDL 151, goal < 70  Add - statin when taking PO meds  Continue statin at discharge  Diabetes  HgbA1c 6.1 goal < 7.0  Controlled  Other Stroke Risk Factors  Advanced age  Family hx stroke (maternal uncle)  Stroke team will sign off please call if we can be of further  service  Personally examined patient and images, and have documentes history, physical, neuro exam,assessment and plan as stated above.   Naomie DeanAntonia Ahern, MD Stroke Neurology [phone] [facility]       To contact Stroke Continuity provider, please refer to [email or web address]. After hours, contact General Neurology

## 2014-11-18 NOTE — Evaluation (Signed)
Clinical/Bedside Swallow Evaluation Patient Details  Name: Drue Stagerlonzo Mantey MRN: [id number] Date of Birth: 1951/06/14  Today's Date: 11/18/2014 Time: [id number] SLP Time Calculation (min) (ACUTE ONLY): 18 min  Past Medical History:  Past Medical History  Diagnosis Date  . Allergy   . GERD (gastroesophageal reflux disease)   . Huntington chorea     [facility] Dr Boris SharperFrancis Walker [phone] [name]  . Chronic diarrhea    Past Surgical History:  Past Surgical History  Procedure Laterality Date  . Tonsillectomy  1959   HPI:  63 y.o. male with history of Huntington's Chorea, GERD admitted with inability to speak. MRI Left MCA M1 segment occlusion with patchy acute infarcts in the   Assessment / Plan / Recommendation Clinical Impression  Decreased hyolaryngeal elevation, multiple swallow, delayed throat clear, decreased labial seal lead to oral and possible pharyngeal residue incomplete laryngeal/tracheal protection. Risk increases with hx Huntington's and acute CVA. Objective assessment with MBS recommended scheduled at 10:45 today. Continue NPO until MBS.    Aspiration Risk   (mod-severe)    Diet Recommendation NPO        Other  Recommendations Recommended Consults: MBS Oral Care Recommendations: Oral care BID   Follow Up Recommendations   (TBD)    Frequency and Duration        Pertinent Vitals/Pain No pain         Swallow Study          Oral/Motor/Sensory Function Overall Oral Motor/Sensory Function: Impaired Labial ROM:  (unable to protrude) Labial Symmetry: Abnormal symmetry right Labial Strength: Within Functional Limits Lingual ROM:  (imprecise) Lingual Symmetry: Within Functional Limits Lingual Strength: Reduced   Ice Chips Ice chips: Impaired Oral Phase Functional Implications: Prolonged oral transit Pharyngeal Phase Impairments: Suspected delayed Swallow   Thin Liquid Thin Liquid: Impaired Presentation: Cup Oral Phase Impairments:  Reduced labial seal Oral Phase Functional Implications: Right anterior spillage Pharyngeal  Phase Impairments: Decreased hyoid-laryngeal movement;Multiple swallows;Throat Clearing - Delayed    Nectar Thick Nectar Thick Liquid: Not tested   Honey Thick Honey Thick Liquid: Not tested   Puree Puree: Impaired Pharyngeal Phase Impairments: Decreased hyoid-laryngeal movement;Multiple swallows   Solid   GO    Solid: Not tested       Royce MacadamiaLitaker, Chancy Claros Willis 11/18/2014,8:41 AM  Breck CoonsLisa Willis Lonell FaceLitaker M.Ed [employer]-SLP Pager [phone]

## 2014-11-19 DIAGNOSIS — F332 Major depressive disorder, recurrent severe without psychotic features: Secondary | ICD-10-CM

## 2014-11-19 DIAGNOSIS — I6330 Cerebral infarction due to thrombosis of unspecified cerebral artery: Secondary | ICD-10-CM

## 2014-11-19 LAB — RAPID URINE DRUG SCREEN, HOSP PERFORMED
Amphetamines: NOT DETECTED
Barbiturates: NOT DETECTED
Benzodiazepines: NOT DETECTED
Cocaine: NOT DETECTED
Opiates: NOT DETECTED
Tetrahydrocannabinol: NOT DETECTED

## 2014-11-19 LAB — URINALYSIS, ROUTINE W REFLEX MICROSCOPIC
Glucose, UA: 100 mg/dL — AB
Hgb urine dipstick: NEGATIVE
Ketones, ur: 40 mg/dL — AB
LEUKOCYTES UA: NEGATIVE
Nitrite: NEGATIVE
PH: 5.5 (ref 5.0–8.0)
Protein, ur: NEGATIVE mg/dL
Specific Gravity, Urine: 1.027 (ref 1.005–1.030)
UROBILINOGEN UA: 1 mg/dL (ref 0.0–1.0)

## 2014-11-19 MED ORDER — ATORVASTATIN CALCIUM 10 MG PO TABS
20.0000 mg | ORAL_TABLET | Freq: Every day | ORAL | Status: DC
  Administered 2014-11-19 – 2014-11-20 (×2): 20 mg via ORAL
  Filled 2014-11-19 (×2): qty 2

## 2014-11-19 NOTE — Progress Notes (Signed)
Rehab Admissions Coordinator Note:  Patient was screened by Trish MageLogue, Bruk Tumolo M for appropriateness for an Inpatient Acute Rehab Consult.  At this time, an inpatient rehab has been ordered and is pending completion.  We will follow up after consult is completed.  Trish MageLogue, Jenesys Casseus M 11/19/2014, 9:05 AM  I can be reached at [phone].

## 2014-11-19 NOTE — Progress Notes (Signed)
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 Patient had been followed by neurology. MRI, MRA brain with L MCA M1 occlusion with patchy acute infarcts in the L MCA territory. Echocardiogram with EF of 65-70%. LDL is 151. SLP recs for dysphagia 1 diet with honey thick liquids. Continue aspirin for now. PT and OT recs for CIR, pending. Started statin  Stage III chronic kidney disease Creatinine probably at baseline. Follow BMP  History of Huntington's chorea Stable. Follow[facility]is [facility].  History of recurrent major depression Prozac and Haldol currently held secondary to nothing by mouth status. Resume when able.   Code Status: full Family Communication:  Pt in room Disposition Plan: Pending CIR   Consultants:  Neurology  CIR  Procedures:    Antibiotics:     HPI/Subjective: Pt without acute events noted  Objective: Filed Vitals:   11/18/14 1855 11/18/14 2101 11/19/14 0222 11/19/14 0552  BP: 123/76 98/75 120/78 115/74  Pulse: 86 65 67 61  Temp: 98.2 F (36.8 C) 99 F (37.2 C) 98.4 F (36.9 C) 98.1 F (36.7 C)  TempSrc: Oral Oral Oral Axillary  Resp: 18 18 18 16   Weight:      SpO2: 95% 98% 99% 100%    Intake/Output Summary (Last 24 hours) at 11/19/14 0848 Last data filed at 11/18/14 1700  Gross per 24 hour  Intake    520 ml  Output      0 ml  Net    520 ml   Filed Weights   11/16/14 1804  Weight: 68.901 kg (151 lb 14.4 oz)    Exam:   General:  Asleep, easily arousable, in nad  Cardiovascular: regular, s1, s2  Respiratory: normal resp effort, no wheezing  Abdomen: soft, nondistended  Musculoskeletal: perfused, no clubbing   Data Reviewed: Basic Metabolic Panel:  Recent Labs Lab 11/16/14 1420 11/16/14 1432 11/18/14 0421  NA  136 139 140  K 4.3 4.3 4.0  CL 104 104 108  CO2 22  --  23  GLUCOSE 114* 112* 77  BUN 9 10 12   CREATININE 1.33 1.10 1.13  CALCIUM 9.2  --  8.9   Liver Function Tests:  Recent Labs Lab 11/16/14 1420  AST 20  ALT 12  ALKPHOS 121*  BILITOT 0.8  PROT 6.9  ALBUMIN 3.7   No results for input(s): LIPASE, AMYLASE in the last 168 hours. No results for input(s): AMMONIA in the last 168 hours. CBC:  Recent Labs Lab 11/16/14 1420 11/16/14 1432 11/18/14 0421  WBC 6.3  --  5.0  NEUTROABS 4.6  --   --   HGB 14.2 16.3 13.4  HCT 42.3 48.0 40.3  MCV 87.0  --  88.0  PLT 236  --  261   Cardiac Enzymes: No results for input(s): CKTOTAL, CKMB, CKMBINDEX, TROPONINI in the last 168 hours. BNP (last 3 results) No results for input(s): PROBNP in the last 8760 hours. CBG:  Recent Labs Lab 11/16/14 1411  GLUCAP 102*    No results found for this or any previous visit (from the past 240 hour(s)).   Studies: Mr Sherrin DaisyBrain Wo Contrast  11/17/2014   ADDENDUM REPORT: 11/17/2014 16:36  ADDENDUM: Study discussed by telephone with Dr. Osvaldo ShipperGokul Krishnan on 11/17/2014 at 16:34 .   Electronically Signed   By: Si GaulLee  Hall M.D.  On: 11/17/2014 16:36   11/17/2014   CLINICAL DATA:  63 year old male with severe confusion, loss of speech and right facial droop. Initial encounter. Current history of Huntington chorea.  EXAM: MRI HEAD WITHOUT CONTRAST  MRA HEAD WITHOUT CONTRAST  TECHNIQUE: Multiplanar, multiecho pulse sequences of the brain and surrounding structures were obtained without intravenous contrast. Angiographic images of the head were obtained using MRA technique without contrast.  COMPARISON:  Head CT without contrast 11/16/2014.  FINDINGS: MRI HEAD FINDINGS  The examination had to be discontinued prior to completion due to patient agitation.  Scattered Patchy and confluent areas of restricted diffusion in the left hemisphere, with confluent involvement of the left basal ganglia and posterior left  temporal lobe (series 3).  No contralateral or posterior fossa restricted diffusion.  Cerebral volume loss with probable ex vacuo ventricular enlargement. No midline shift. T2 hyperintensity in the acutely affected areas compatible with cytotoxic edema. No intracranial mass effect.  MRA HEAD FINDINGS  Study is moderately degraded by motion artifact despite repeated imaging attempts.  Antegrade flow in the posterior circulation which appears to be supplied via the left vertebral artery. The basilar artery is patent. The PCA origins are patent.  Antegrade flow in both ICA siphons. Both carotid termini are patent. The right MCA M1 segment is patent, but the left M1 segment appears occluded about 8 mm beyond its origin. Both ACA origins are patent.  IMPRESSION: 1. Truncated and motion degraded exam due to patient agitation. 2. 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 No evidence of hemorrhage or mass effect at this time.  Electronically Signed: By: Augusto Gamble M.D. On: 11/17/2014 16:31   Dg Swallowing Func-speech Pathology  11/18/2014   Breck Coons Logan, CCC-SLP     11/18/2014 11:55 AM Objective Swallowing Evaluation: Modified Barium Swallowing Study   Patient Details  Name: Eric Waters MRN: [id number] Date of Birth: [birth year]  Today's Date: 11/18/2014 Time: 2536-6440 SLP Time Calculation (min) (ACUTE ONLY): 18 min  Past Medical History:  Past Medical History  Diagnosis Date  . Allergy   . GERD (gastroesophageal reflux disease)   . Huntington chorea     previously followed at [facility]- Dr Boris Sharper [phone]  [name])  . Chronic diarrhea    Past Surgical History:  Past Surgical History  Procedure Laterality Date  . Tonsillectomy  1959   HPI:  63 y.o. male with history of Huntington's Chorea, GERD admitted  with inability to speak. MRI Left MCA M1 segment occlusion with  patchy acute infarcts in the left MCA territory.  BSE recommended  MBS.     Assessment / Plan /  Recommendation Clinical Impression  Dysphagia Diagnosis: Moderate oral phase dysphagia;Moderate  pharyngeal phase dysphagia Clinical impression: Pt. exhbited moderate oral and pharyngeal  dysphagia with sensory > motor impairments.  Decreased lingual  strength led to moderate oral residue spilling to valleculae and  filling pyriform sinuses frequently throughout study with  decreased awareness. Laryngeal penetration to vocal cords with  cup sip nectar (large sip despite frequent therapeutic cues for  small sips). Mild-moderate pyriform sinsus residue due to  decreased laryngeal elevation.  Nectar thick consistency poses a  greater aspiration risk and SLP recommends honey thick liquids  and Dys 1 texture, 2 swallows, SMALL sips and bites, full  supervision due to cognitive impairments and pills crushed.  ST  will continue to follow.     Treatment Recommendation  Therapy as outlined in treatment plan below  Diet Recommendation Dysphagia 1 (Puree);Honey-thick liquid   Liquid Administration via: Cup;No straw Medication Administration: Crushed with puree Supervision: Patient able to self feed;Staff to assist with self  feeding;Full supervision/cueing for compensatory strategies Compensations: Slow rate;Small sips/bites;Multiple dry swallows  after each bite/sip;Check for pocketing Postural Changes and/or Swallow Maneuvers: Seated upright 90  degrees;Upright 30-60 min after meal    Other  Recommendations Recommended Consults: MBS Oral Care Recommendations: Oral care BID Other Recommendations: Order thickener from pharmacy   Follow Up Recommendations   (TBD)    Frequency and Duration min 2x/week  2 weeks   Pertinent Vitals/Pain No evidence pain         Reason for Referral Objectively evaluate swallowing function   Oral Phase Oral Preparation/Oral Phase Oral Phase: Impaired Oral - Honey Oral - Honey Teaspoon: Delayed oral transit;Weak lingual  manipulation Oral - Honey Cup: Weak lingual manipulation;Delayed oral transit  Oral - Nectar Oral - Nectar Teaspoon: Weak lingual manipulation;Reduced  posterior propulsion;Lingual/palatal residue Oral - Nectar Cup: Weak lingual manipulation;Reduced posterior  propulsion;Lingual/palatal residue Oral - Solids Oral - Regular: Delayed oral transit;Weak lingual  manipulation;Impaired mastication   Pharyngeal Phase Pharyngeal Phase Pharyngeal Phase: Impaired Pharyngeal - Honey Pharyngeal - Honey Teaspoon: Within functional limits Pharyngeal - Honey Cup: Pharyngeal residue - valleculae;Reduced  tongue base retraction (min-mild) Pharyngeal - Nectar Pharyngeal - Nectar Teaspoon: Delayed swallow  initiation;Premature spillage to valleculae Pharyngeal - Nectar Cup: Penetration/Aspiration during  swallow;Pharyngeal residue - pyriform sinuses;Reduced laryngeal  elevation Penetration/Aspiration details (nectar cup): Material enters  airway, CONTACTS cords and not ejected out Pharyngeal - Thin Pharyngeal - Thin Teaspoon: Delayed swallow initiation;Premature  spillage to pyriform sinuses Pharyngeal - Solids Pharyngeal - Puree: Delayed swallow initiation;Premature spillage  to valleculae Pharyngeal - Regular: Within functional limits  Cervical Esophageal Phase    GO    Cervical Esophageal Phase Cervical Esophageal Phase: Leonarda Salon         Royce Macadamia 11/18/2014, 11:54 AM  [phone]    Mr Maxine Glenn Head/brain Wo Cm  11/17/2014   ADDENDUM REPORT: 11/17/2014 16:36  ADDENDUM: Study discussed by telephone with Dr. Osvaldo Shipper on 11/17/2014 at 16:34 .   Electronically Signed   By: Augusto Gamble M.D.   On: 11/17/2014 16:36   11/17/2014   CLINICAL DATA:  63 year old male with severe confusion, loss of speech and right facial droop. Initial encounter. Current history of Huntington chorea.  EXAM: MRI HEAD WITHOUT CONTRAST  MRA HEAD WITHOUT CONTRAST  TECHNIQUE: Multiplanar, multiecho pulse sequences of the brain and surrounding structures were obtained without intravenous contrast. Angiographic images of the head were  obtained using MRA technique without contrast.  COMPARISON:  Head CT without contrast 11/16/2014.  FINDINGS: MRI HEAD FINDINGS  The examination had to be discontinued prior to completion due to patient agitation.  Scattered Patchy and confluent areas of restricted diffusion in the left hemisphere, with confluent involvement of the left basal ganglia and posterior left temporal lobe (series 3).  No contralateral or posterior fossa restricted diffusion.  Cerebral volume loss with probable ex vacuo ventricular enlargement. No midline shift. T2 hyperintensity in the acutely affected areas compatible with cytotoxic edema. No intracranial mass effect.  MRA HEAD FINDINGS  Study is moderately degraded by motion artifact despite repeated imaging attempts.  Antegrade flow in the posterior circulation which appears to be supplied via the left vertebral artery. The basilar artery is patent. The PCA origins are patent.  Antegrade flow in both ICA siphons. Both carotid termini are patent. The right MCA  M1 segment is patent, but the left M1 segment appears occluded about 8 mm beyond its origin. Both ACA origins are patent.  IMPRESSION: 1. Truncated and motion degraded exam due to patient agitation. 2. 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 No evidence of hemorrhage or mass effect at this time.  Electronically Signed: By: Augusto GambleLee  Hall M.D. On: 11/17/2014 16:31    Scheduled Meds: . aspirin  300 mg Rectal Daily  . enoxaparin (LOVENOX) injection  40 mg Subcutaneous Q24H   Continuous Infusions: . sodium chloride 50 mL/hr at 11/16/14 [id number]    Active Problems:   HUNTINGTONS CHOREA   Major depressive disorder, recurrent   CKD (chronic kidney disease), stage III   CVA (cerebral infarction)   Aphasia  Time spent: 20min  Henley Blyth K  [facility] Pager [phone]. If 7PM-7AM, please contact night-coverage at [email or web address], password [email or web address] 11/19/2014, 8:48 AM  LOS: 3 days

## 2014-11-19 NOTE — Progress Notes (Signed)
Occupational Therapy Treatment Patient Details Name: Eric Waters MRN: [id number] DOB: January 28, 1951 Today's Date: 11/19/2014    History of present illness Pt adm with aphasia and rt facial droop. CT neg and MRI (+) Left MCA. PMH - Huntington's Chorea   OT comments  Pt demonstrates ataxic movements, R lateral lean and head tilt for visual scanning. Pt progressed with therapy ambulating short distance with neuromuscular input by therapist to facilitate. Pt with impaired communication and no expressive language provided.   Follow Up Recommendations  CIR;Supervision/Assistance - 24 hour    Equipment Recommendations   (defer)    Recommendations for Other Services Rehab consult    Precautions / Restrictions Precautions Precautions: Fall       Mobility Bed Mobility               General bed mobility comments: sitting EOB with PT on arrival  Transfers Overall transfer level: Needs assistance Equipment used: 2 person hand held assist Transfers: Sit to/from Stand Sit to Stand: +2 physical assistance;Max assist         General transfer comment: Pt requires (A) to complete sit<> satnd with strong R lateral lean    Balance Overall balance assessment: Needs assistance Sitting-balance support: Bilateral upper extremity supported;Feet supported Sitting balance-Leahy Scale: Poor Sitting balance - Comments: progressed to MIN guard with continued effort to help with tacitle input and sequence Postural control: Right lateral lean   Standing balance-Leahy Scale: Zero                     ADL Overall ADL's : Needs assistance/impaired     Grooming: Wash/dry face;Moderate assistance;Cueing for sequencing;Sitting Grooming Details (indicate cue type and reason): tapping face and R UE to initiate                 Toilet Transfer: +2 for physical assistance;Moderate assistance Toilet Transfer Details (indicate cue type and reason): R lateral lean          Functional mobility during ADLs: +2 for physical assistance;Maximal assistance General ADL Comments: Pt demonstrates R lateral lean and adduction bil LE. pt static sitting with neuromuscular input and tactile input. pt progressed to static sitting with MIN (A) input to R LE. Pt attempting ambulation using a bed side table to help with UE support so therapist could work on trunk L lateral input and R LE blocking to prevent adduction. (See pt note for gait and tactile input details)      Vision                 Additional Comments: pt with R lateral tilt to head when scanning . difficult to assess fully at this time. Pt using L eye with head rotated to the R   Perception     Praxis      Cognition   Behavior During Therapy: [facility] for tasks assessed/performed Overall Cognitive Status: Difficult to assess                       Extremity/Trunk Assessment               Exercises     Shoulder Instructions       General Comments      Pertinent Vitals/ Pain       Pain Assessment: No/denies pain  Home Living  Prior Functioning/Environment              Frequency Min 3X/week     Progress Toward Goals  OT Goals(current goals can now be found in the care plan section)  Progress towards OT goals: Progressing toward goals  Acute Rehab OT Goals Patient Stated Goal: none stated due to impaired communication OT Goal Formulation: Patient unable to participate in goal setting Time For Goal Achievement: 12/01/14 Potential to Achieve Goals: Good ADL Goals Pt Will Perform Grooming: with supervision;with set-up;standing Pt Will Perform Upper Body Bathing: with set-up;with supervision;sitting Pt Will Perform Upper Body Dressing: with set-up;with supervision;sitting Pt Will Transfer to Toilet: with supervision;ambulating Pt Will Perform Toileting - Clothing Manipulation and hygiene: with supervision;sit  to/from stand Additional ADL Goal #1: Pt will Independently verbally identify 2 ADL tasks to complete daily.   Plan Discharge plan remains appropriate    Co-evaluation                 End of Session Equipment Utilized During Treatment: Gait belt   Activity Tolerance Patient tolerated treatment well   Patient Left in chair;with call bell/phone within reach;with chair alarm set   Nurse Communication Mobility status;Precautions        Time: 1610-96041500-1519 OT Time Calculation (min): 19 min  Charges: OT General Charges $OT Visit: 1 Procedure OT Treatments $Neuromuscular Re-education: 8-22 mins  Harolyn RutherfordJones, Leniya Breit B 11/19/2014, 3:28 PM Pager: [phone]

## 2014-11-19 NOTE — Consult Note (Signed)
Physical Medicine and Rehabilitation Consult Reason for Consult: Suspect CVA Referring Physician: [facility]   HPI: Eric Waters is a 63 y.o. right handed male with past medical history of Huntington's chorea with gait disorder. Presented 11/16/2014 with aphasia. Initial cranial CT scan negative for acute changes. MRI of the brain with scattered patchy and confluent areas of restricted diffusion in the left hemisphere, involvement of left basal ganglia posterior left temporal lobe. 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 Echocardiogram with ejection fraction of 70% grade 1 diastolic dysfunction. Carotid Dopplers with no ICA stenosis. EEG consistent with the left frontotemporal region of cerebral dysfunction no seizure activity noted. Patient did not receive TPA. Neurology services consulted maintained on aspirin for CVA prophylaxis and subcutaneous Lovenox for DVT prophylaxis. Dysphagia 1 honey thick liquid diet followed by speech therapy. Physical occupational therapy evaluations completed with recommendations of physical medicine rehabilitation consult.   Review of Systems  Unable to perform ROS: language   Past Medical History  Diagnosis Date  . Allergy   . GERD (gastroesophageal reflux disease)   . Huntington chorea     previously followed at [facility]- Dr Boris Sharper [phone] [name])  . Chronic diarrhea    Past Surgical History  Procedure Laterality Date  . Tonsillectomy  1959   Family History  Problem Relation Age of Onset  . Cancer Father     Lung  . Alzheimer's disease Maternal Uncle   . Stroke Maternal Uncle     Alzheimer  . Alzheimer's disease Maternal Grandfather   . Huntington's disease Maternal Grandmother   . Huntington's disease Mother    Social History:  reports that he has never smoked. He has never used smokeless tobacco. He reports that he does not drink alcohol or use illicit  drugs. Allergies:  Allergies  Allergen Reactions  . Floxin [Ofloxacin]   . Haldol [Haloperidol Lactate]   . Codeine Hives and Rash   Medications Prior to Admission  Medication Sig Dispense Refill  . FLUoxetine (PROZAC) 20 MG tablet Take 20 mg by mouth daily.    . haloperidol (HALDOL) 1 MG tablet Take 1 mg by mouth 2 (two) times daily.      Home: Home Living Family/patient expects to be discharged to:: Private residence Living Arrangements: Alone Available Help at Discharge: Family, Available PRN/intermittently (pt has caregiver (cousin's husband) who assists him) Additional Comments: Unable to fully assess due to pt's aphasia. Pt has a caregiver but unsure how much caregiver is with pt.  Functional History: Prior Function Level of Independence: Independent Comments: pt reports he was independent with ADLs and gait, but has gait abnormality at baseline per chart.  Functional Status:  Mobility: Bed Mobility Overal bed mobility: Needs Assistance Bed Mobility: Supine to Sit, Sit to Supine Supine to sit: Min assist, HOB elevated Sit to supine: Min assist General bed mobility comments: Verbal/tactile/visual cues to initiate. Assist to move rt leg off of bed. Transfers Overall transfer level: Needs assistance Equipment used: Rolling walker (2 wheeled) Transfers: Sit to/from Stand Sit to Stand: Min assist Stand pivot transfers: Mod assist General transfer comment: (A) to power up and to achieve balance in full standing. Tactile cues for hand placement.  Ambulation/Gait Ambulation/Gait assistance: Min assist, Mod assist Ambulation Distance (Feet): 5 Feet (forward and back) Assistive device: Rolling walker (2 wheeled) Gait Pattern/deviations: Step-to pattern, Decreased step length - right, Shuffle Gait velocity interpretation: Below normal speed for  age/gender General Gait Details: Pt with difficulty advancing RLE especially when stepping backwards.    ADL: ADL Overall ADL's :  Needs assistance/impaired Eating/Feeding: NPO Grooming: Wash/dry face, Set up, Sitting Grooming Details (indicate cue type and reason): pt indicated he was hot and provided with cool washrag to wipe his face Upper Body Bathing: Minimal assitance, Sitting Lower Body Bathing: Moderate assistance, Sit to/from stand Upper Body Dressing : Moderate assistance, Sitting Lower Body Dressing: Maximal assistance, Sit to/from stand Toilet Transfer: Stand-pivot, RW, Minimal assistance General ADL Comments: Pt having difficulty expressing himself due to aphasia. Able to answer yes to appropriate questions. Asked his name, pt required significant time and cueing with first letter before whispering "Eric Waters." Utilized communication sheet with pictures for pt to indicate needs. He pointed to "hot" and "bath" appropriately and emphatically said "yes" when asked if he was hot and wanted a bath. Educated pt's caregiver on use of communication cards and will try to get more for pt/family use.   Cognition: Cognition Overall Cognitive Status: Difficult to assess Orientation Level: Disoriented X4 Cognition Arousal/Alertness: Awake/alert Behavior During Therapy: WFL for tasks assessed/performed Overall Cognitive Status: Difficult to assess Difficult to assess due to: Impaired communication  Blood pressure 115/74, pulse 61, temperature 98.1 F (36.7 C), temperature source Axillary, resp. rate 16, weight 68.901 kg (151 lb 14.4 oz), SpO2 100 %. Physical Exam  Vitals reviewed. HENT:  Head: Normocephalic.  Eyes: EOM are normal.  Neck: Normal range of motion. Neck supple. No thyromegaly present.  Cardiovascular: Normal rate and regular rhythm.   Respiratory: Effort normal and breath sounds normal. No respiratory distress.  GI: Soft. Bowel sounds are normal. He exhibits no distension.  Neurological:  Patient lethargic but arousable. He did make eye contact with examiner but was nonverbal and would not follow  commands. Withdraws to deep palpation. Ongoing tremors worst with intentional movements. Initiates less movement with right side but moves all 4's. Effort is inconsistent with MMT. Word finding deficits. Able to answer with one or two words usually when cued.  Skin: Skin is warm and dry.    No results found for this or any previous visit (from the past 24 hour(s)). Mr Brain Ilda Basset Contrast  11/17/2014   ADDENDUM REPORT: 11/17/2014 16:36  ADDENDUM: Study discussed by telephone with Dr. Osvaldo Shipper on 11/17/2014 at 16:34 .   Electronically Signed   By: Augusto Gamble M.D.   On: 11/17/2014 16:36   11/17/2014   CLINICAL DATA:  63 year old male with severe confusion, loss of speech and right facial droop. Initial encounter. Current history of Huntington chorea.  EXAM: MRI HEAD WITHOUT CONTRAST  MRA HEAD WITHOUT CONTRAST  TECHNIQUE: Multiplanar, multiecho pulse sequences of the brain and surrounding structures were obtained without intravenous contrast. Angiographic images of the head were obtained using MRA technique without contrast.  COMPARISON:  Head CT without contrast 11/16/2014.  FINDINGS: MRI HEAD FINDINGS  The examination had to be discontinued prior to completion due to patient agitation.  Scattered Patchy and confluent areas of restricted diffusion in the left hemisphere, with confluent involvement of the left basal ganglia and posterior left temporal lobe (series 3).  No contralateral or posterior fossa restricted diffusion.  Cerebral volume loss with probable ex vacuo ventricular enlargement. No midline shift. T2 hyperintensity in the acutely affected areas compatible with cytotoxic edema. No intracranial mass effect.  MRA HEAD FINDINGS  Study is moderately degraded by motion artifact despite repeated imaging attempts.  Antegrade flow in the posterior circulation which appears  to be supplied via the left vertebral artery. The basilar artery is patent. The PCA origins are patent.  Antegrade flow in both ICA  siphons. Both carotid termini are patent. The right MCA M1 segment is patent, but the left M1 segment appears occluded about 8 mm beyond its origin. Both ACA origins are patent.  IMPRESSION: 1. Truncated and motion degraded exam due to patient agitation. 2. 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 No evidence of hemorrhage or mass effect at this time.  Electronically Signed: By: Augusto Gamble M.D. On: 11/17/2014 16:31   Dg Swallowing Func-speech Pathology  11/18/2014   Breck Coons Douglas, CCC-SLP     11/18/2014 11:55 AM Objective Swallowing Evaluation: Modified Barium Swallowing Study   Patient Details  Name: Eric Waters MRN: [id number] Date of Birth: [birth year]  Today's Date: 11/18/2014 Time: 4098-1191 SLP Time Calculation (min) (ACUTE ONLY): 18 min  Past Medical History:  Past Medical History  Diagnosis Date  . Allergy   . GERD (gastroesophageal reflux disease)   . Huntington chorea     previously followed at [facility]- Dr Boris Sharper [phone]  [name])  . Chronic diarrhea    Past Surgical History:  Past Surgical History  Procedure Laterality Date  . Tonsillectomy  1959   HPI:  63 y.o. male with history of Huntington's Chorea, GERD admitted  with inability to speak. MRI Left MCA M1 segment occlusion with  patchy acute infarcts in the left MCA territory.  BSE recommended  MBS.     Assessment / Plan / Recommendation Clinical Impression  Dysphagia Diagnosis: Moderate oral phase dysphagia;Moderate  pharyngeal phase dysphagia Clinical impression: Pt. exhbited moderate oral and pharyngeal  dysphagia with sensory > motor impairments.  Decreased lingual  strength led to moderate oral residue spilling to valleculae and  filling pyriform sinuses frequently throughout study with  decreased awareness. Laryngeal penetration to vocal cords with  cup sip nectar (large sip despite frequent therapeutic cues for  small sips). Mild-moderate pyriform sinsus residue due to  decreased  laryngeal elevation.  Nectar thick consistency poses a  greater aspiration risk and SLP recommends honey thick liquids  and Dys 1 texture, 2 swallows, SMALL sips and bites, full  supervision due to cognitive impairments and pills crushed.  ST  will continue to follow.     Treatment Recommendation  Therapy as outlined in treatment plan below    Diet Recommendation Dysphagia 1 (Puree);Honey-thick liquid   Liquid Administration via: Cup;No straw Medication Administration: Crushed with puree Supervision: Patient able to self feed;Staff to assist with self  feeding;Full supervision/cueing for compensatory strategies Compensations: Slow rate;Small sips/bites;Multiple dry swallows  after each bite/sip;Check for pocketing Postural Changes and/or Swallow Maneuvers: Seated upright 90  degrees;Upright 30-60 min after meal    Other  Recommendations Recommended Consults: MBS Oral Care Recommendations: Oral care BID Other Recommendations: Order thickener from pharmacy   Follow Up Recommendations   (TBD)    Frequency and Duration min 2x/week  2 weeks   Pertinent Vitals/Pain No evidence pain         Reason for Referral Objectively evaluate swallowing function   Oral Phase Oral Preparation/Oral Phase Oral Phase: Impaired Oral - Honey Oral - Honey Teaspoon: Delayed oral transit;Weak lingual  manipulation Oral - Honey Cup: Weak lingual manipulation;Delayed oral transit Oral - Nectar Oral - Nectar Teaspoon: Weak lingual manipulation;Reduced  posterior propulsion;Lingual/palatal residue Oral - Nectar Cup: Weak lingual manipulation;Reduced posterior  propulsion;Lingual/palatal residue Oral - Solids Oral -  Regular: Delayed oral transit;Weak lingual  manipulation;Impaired mastication   Pharyngeal Phase Pharyngeal Phase Pharyngeal Phase: Impaired Pharyngeal - Honey Pharyngeal - Honey Teaspoon: Within functional limits Pharyngeal - Honey Cup: Pharyngeal residue - valleculae;Reduced  tongue base retraction (min-mild) Pharyngeal - Nectar  Pharyngeal - Nectar Teaspoon: Delayed swallow  initiation;Premature spillage to valleculae Pharyngeal - Nectar Cup: Penetration/Aspiration during  swallow;Pharyngeal residue - pyriform sinuses;Reduced laryngeal  elevation Penetration/Aspiration details (nectar cup): Material enters  airway, CONTACTS cords and not ejected out Pharyngeal - Thin Pharyngeal - Thin Teaspoon: Delayed swallow initiation;Premature  spillage to pyriform sinuses Pharyngeal - Solids Pharyngeal - Puree: Delayed swallow initiation;Premature spillage  to valleculae Pharyngeal - Regular: Within functional limits  Cervical Esophageal Phase    GO    Cervical Esophageal Phase Cervical Esophageal Phase: Leonarda SalonWFL         Royce MacadamiaLitaker, Lisa Willis 11/18/2014, 11:54 AM  [phone]    Mr Maxine GlennMra Head/brain Wo Cm  11/17/2014   ADDENDUM REPORT: 11/17/2014 16:36  ADDENDUM: Study discussed by telephone with Dr. Osvaldo ShipperGokul Krishnan on 11/17/2014 at 16:34 .   Electronically Signed   By: Augusto GambleLee  Hall M.D.   On: 11/17/2014 16:36   11/17/2014   CLINICAL DATA:  63 year old male with severe confusion, loss of speech and right facial droop. Initial encounter. Current history of Huntington chorea.  EXAM: MRI HEAD WITHOUT CONTRAST  MRA HEAD WITHOUT CONTRAST  TECHNIQUE: Multiplanar, multiecho pulse sequences of the brain and surrounding structures were obtained without intravenous contrast. Angiographic images of the head were obtained using MRA technique without contrast.  COMPARISON:  Head CT without contrast 11/16/2014.  FINDINGS: MRI HEAD FINDINGS  The examination had to be discontinued prior to completion due to patient agitation.  Scattered Patchy and confluent areas of restricted diffusion in the left hemisphere, with confluent involvement of the left basal ganglia and posterior left temporal lobe (series 3).  No contralateral or posterior fossa restricted diffusion.  Cerebral volume loss with probable ex vacuo ventricular enlargement. No midline shift. T2 hyperintensity in the  acutely affected areas compatible with cytotoxic edema. No intracranial mass effect.  MRA HEAD FINDINGS  Study is moderately degraded by motion artifact despite repeated imaging attempts.  Antegrade flow in the posterior circulation which appears to be supplied via the left vertebral artery. The basilar artery is patent. The PCA origins are patent.  Antegrade flow in both ICA siphons. Both carotid termini are patent. The right MCA M1 segment is patent, but the left M1 segment appears occluded about 8 mm beyond its origin. Both ACA origins are patent.  IMPRESSION: 1. Truncated and motion degraded exam due to patient agitation. 2. 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 No evidence of hemorrhage or mass effect at this time.  Electronically Signed: By: Augusto GambleLee  Hall M.D. On: 11/17/2014 16:31    Assessment/Plan: Diagnosis: left BG/temporal--MCA infarcts 1. Does the need for close, 24 hr/day medical supervision in concert with the patient's rehab needs make it unreasonable for this patient to be served in a less intensive setting? Yes and Potentially 2. Co-Morbidities requiring supervision/potential complications: huntington's chorea 3. Due to bladder management, bowel management, safety, skin/wound care, disease management, medication administration, pain management and patient education, does the patient require 24 hr/day rehab nursing? Yes and Potentially 4. Does the patient require coordinated care of a physician, rehab nurse, PT (1-2 hrs/day, 5 days/week), OT (1-2 hrs/day, 5 days/week) and SLP (1-2 hrs/day, 5 days/week) to address physical and functional deficits in  the context of the above medical diagnosis(es)? Yes Addressing deficits in the following areas: balance, endurance, locomotion, strength, transferring, bowel/bladder control, bathing, dressing, feeding, grooming, toileting, cognition, speech, language, swallowing and psychosocial support 5. Can the  patient actively participate in an intensive therapy program of at least 3 hrs of therapy per day at least 5 days per week? Yes 6. The potential for patient to make measurable gains while on inpatient rehab is good 7. Anticipated functional outcomes upon discharge from inpatient rehab are supervision  with PT, supervision with OT, supervision to min assist with SLP. 8. Estimated rehab length of stay to reach the above functional goals is: 15-18 days 9. Does the patient have adequate social supports and living environment to accommodate these discharge functional goals? No and Potentially 10. Anticipated D/C setting: Home 11. Anticipated post D/C treatments: HH therapy 12. Overall Rehab/Functional Prognosis: good  RECOMMENDATIONS: This patient's condition is appropriate for continued rehabilitative care in the following setting: CIR pending dispo  Patient has agreed to participate in recommended program. Yes Note that insurance prior authorization may be required for reimbursement for recommended care.  Comment: Rehab Admissions Coordinator to follow up.  Thanks,  Ranelle OysterZachary T. Swartz, MD, [place] DomFAAPMR     11/19/2014

## 2014-11-19 NOTE — Progress Notes (Signed)
Physical Therapy Treatment Patient Details Name: Eric Waters MRN: [id number] DOB: [birth year] Today's Date: 11/19/2014    History of Present Illness Pt adm with aphasia and rt facial droop. CT neg and MRI (+) Left MCA. PMH - Huntington's Chorea    PT Comments    Pt with strong R lateral lean in sitting and standing but improved once proximal stability provided. Pt con't to have aphasia/impaired communication. Pt was able to sit EOB x 10 min for various task. Pt was able to amb with maxAx2 x 5 feet. Pt con't to benefit from CIR upon d/c for maximal functional recovery.   Follow Up Recommendations  CIR     Equipment Recommendations       Recommendations for Other Services       Precautions / Restrictions Precautions Precautions: Fall Restrictions Weight Bearing Restrictions: No    Mobility  Bed Mobility Overal bed mobility: Needs Assistance Bed Mobility: Supine to Sit     Supine to sit: Mod assist;HOB elevated     General bed mobility comments: max directional cues, assist for R LE, assist to bring hips to EOB  Transfers Overall transfer level: Needs assistance Equipment used: 2 person hand held assist Transfers: Sit to/from Stand Sit to Stand: +2 physical assistance;Max assist         General transfer comment: pt with strong R lateral lean and R LE adduction. Pt required increased tactile cues proximally to stabilize to improve posture and ability to stand  Ambulation/Gait Ambulation/Gait assistance: Max assist;+2 physical assistance Ambulation Distance (Feet): 5 Feet Assistive device:  (pushed elevated tray table) Gait Pattern/deviations: Step-to pattern Gait velocity: slow   General Gait Details: pt required assist at hips and trunk to maintain midline position, assist at R LE to prevent R LE adduction   Stairs            Wheelchair Mobility    Modified Rankin (Stroke Patients Only)       Balance Overall balance assessment: Needs  assistance Sitting-balance support: Bilateral upper extremity supported;Feet supported Sitting balance-Leahy Scale: Poor Sitting balance - Comments: R lateral lean, max tactile cues at R LE to maintain R foot flat and no adducted. tactile cues at scapula to bring chest up Postural control: Right lateral lean   Standing balance-Leahy Scale: Zero                      Cognition Arousal/Alertness: Awake/alert Behavior During Therapy: WFL for tasks assessed/performed Overall Cognitive Status: Difficult to assess                      Exercises General Exercises - Lower Extremity Quad Sets: Both;10 reps;Supine;AAROM Short Arc Quad: AROM;Both;10 reps;Supine Heel Slides: AAROM;Both;10 reps;Supine    General Comments        Pertinent Vitals/Pain Pain Assessment: No/denies pain    Home Living                      Prior Function            PT Goals (current goals can now be found in the care plan section) Acute Rehab PT Goals Patient Stated Goal: none stated due to impaired communication Progress towards PT goals: Progressing toward goals    Frequency  Min 4X/week    PT Plan Current plan remains appropriate    Co-evaluation             End of Session Equipment Utilized During Treatment:  Gait belt Activity Tolerance: Patient tolerated treatment well Patient left: in chair;with call bell/phone within reach;with chair alarm set     Time: 1445-1516 PT Time Calculation (min) (ACUTE ONLY): 31 min  Charges:  $Gait Training: 8-22 mins $Therapeutic Exercise: 8-22 mins                    G Codes:      Marcene BrawnChadwell, Delvon Chipps Marie 11/19/2014, 4:29 PM   Lewis ShockAshly Roselee Tayloe, PT, DPT Pager #: [phone] Office #: [phone]

## 2014-11-19 NOTE — Progress Notes (Signed)
UR COMPLETED  

## 2014-11-19 NOTE — Progress Notes (Signed)
Rehab admissions - I met with pt in follow up in rehab consult and observed pt working with therapy. Pt with aphasia and was not able to answer my questions. I then called pt's cousin, Jaci Standard and he shared some baseline details with me. Jaci Standard stated that he and his wife could not provide supervision at the end of possible rehab stay as they will be taking care of his wife's mother (who recently broke her pelvis).  Discussion was had between the differences between SNF and acute rehab. I will discuss pt's case with rehab team, including social workers and rehab MD to discuss the difficult DC options. Pt has [employer] and we would need insurance authorization for inpatient rehab as well.  I will share update with pt/family tomorrow. Thanks.  Nanetta Batty, PT Rehabilitation Admissions Coordinator [phone]

## 2014-11-19 NOTE — Progress Notes (Signed)
Rehab admissions - I spoke with Dr. Riley KillSwartz.  Patient does not have caregiver support after a potential inpatient rehab stay.  Patient will require 24/7 supervision after rehab.  Therefore, Dr. Riley KillSwartz recommends that we move forward with SNF placement rather that CIR for rehab.  Call for questions.  #[phone]

## 2014-11-20 DIAGNOSIS — I63039 Cerebral infarction due to thrombosis of unspecified carotid artery: Secondary | ICD-10-CM

## 2014-11-20 MED ORDER — HALOPERIDOL 1 MG PO TABS: 1.0000 mg | ORAL_TABLET | Freq: Two times a day (BID) | ORAL | Status: DC

## 2014-11-20 MED ORDER — ASPIRIN EC 325 MG PO TBEC: 325.0000 mg | DELAYED_RELEASE_TABLET | Freq: Every day | ORAL | Status: DC

## 2014-11-20 MED ORDER — HALOPERIDOL 1 MG PO TABS
1.0000 mg | ORAL_TABLET | Freq: Two times a day (BID) | ORAL | Status: DC
  Administered 2014-11-20: 1 mg via ORAL
  Filled 2014-11-20: qty 1

## 2014-11-20 MED ORDER — FLUOXETINE HCL 20 MG PO CAPS
20.0000 mg | ORAL_CAPSULE | Freq: Every day | ORAL | Status: DC
  Administered 2014-11-20: 20 mg via ORAL
  Filled 2014-11-20 (×2): qty 1

## 2014-11-20 MED ORDER — ATORVASTATIN CALCIUM 20 MG PO TABS: 20.0000 mg | ORAL_TABLET | Freq: Every day | ORAL | Status: AC

## 2014-11-20 NOTE — Clinical Social Work Note (Signed)
Clinical Social Worker facilitated patient discharge including contacting patient family and facility to confirm patient discharge plans.  Clinical information faxed to facility and family agreeable with plan.  ED CSW to arrange [facility].  RN has called report.  Clinical Social Worker will sign off for now as social work intervention is no longer needed. Please consult us again if new need arises.  Derenda FennelBashira Flonnie Wierman, MSW, LCSWA [phone] 11/20/2014 4:39 PM

## 2014-11-20 NOTE — Progress Notes (Signed)
Report given to  Jonny RuizJohn, RN at [facility]. Pt to be transported to facility per ambulance with ambulance personnel, as arranged by SW. Will monitor    Andrew AuVafiadis, Rivers Gassmann I 11/20/2014 2:58 PM

## 2014-11-20 NOTE — Progress Notes (Signed)
Patient discharged to [name]. Transported via [facility]. All belonging sent with patient. Monia PouchShakenna Tava Peery, RN

## 2014-11-20 NOTE — Progress Notes (Signed)
[facility] transportation arranged on behalf of pt.  Pt's contact informed that authorization had been received and pt would be going to [name] this pm.

## 2014-11-20 NOTE — Discharge Summary (Signed)
Physician Discharge Summary  Eric Waters NFA:[id number] DOB: 10/26/1951 DOA: 11/16/2014  PCP: Rene Paci, MD  Admit date: 11/16/2014 Discharge date: 11/20/2014  Time spent: 45 minutes  Recommendations for Outpatient Follow-up:  1. Going to SNF for rehab 2. Possible liver cyst seen on ECHO- abdominal ultrasound recommended  Discharge Condition: stable Diet recommendation: SLP recs for dysphagia 1 diet with honey thick liquids.   Discharge Diagnoses:  Principal Problem:   CVA (cerebral infarction) Active Problems:   Aphasia   HUNTINGTONS CHOREA   Major depressive disorder, recurrent   CKD (chronic kidney disease), stage III   History of present illness:  Eric Waters is a 63 y.o. male with history of Huntington's Chorea, abnormal gait secondary to same, MDD, presented to the ED with above complaints. Patient is minimally verbal and is unable to provide any history. History is obtained from patient's caregiver/health care power of attorney Mr. Tori Milks. Patient was is his usual state of health until 7 PM on 11/15/14. Patient has multiple cameras in his room and the caregiver is able to contact him through that. He is coherent at baseline with some gait abnormality. At approximately 12 PM on 11/16/14 when caregiver tried to contact him, he noticed that the television in patient's house was turned off which is very unusual for him. Patient would not respond to his call. Mr. Fraser Din then went by to patient's room and noticed that patient was not speaking and had facial droop. There was no reported asymmetrical limb weakness. He was brought to the emergency room where CT head was negative. Neurology was consulted by EDP and recommend hospitalist admission for stroke workup.  Hospital Course:  Acute L MCA CVA with receptive and expressive aphasia Presenting CT head was negative. Patient had been followed by neurology. MRI, MRA brain with L MCA M1 occlusion with patchy acute  infarcts in the L MCA territory. Echocardiogram with EF of 65-70%. LDL is 151. SLP recs for dysphagia 1 diet with honey thick liquids. LDL 151 HgbA1c - 6.1 Started statin and full dose aspirin -    Stage III chronic kidney disease Creatinine normalized  History of Huntington's chorea Stable. Follows with Dr. Boris Sharper at [facility].  History of recurrent major depression Prozac and Haldol resumed   Procedures:  none  Consultations: Vascular Ultrasound Carotid Duplex (Doppler) has been completed. Preliminary findings: Bilateral: 1-39% ICA stenosis. Vertebral artery flow is antegrade bilaterally, however the right vertebral is atypical with loss of diastolic component, suggesting a distal obstruction.   EEG:  1) left temporoparietal delta activity Clinical Interpretation: This EEG is consistent with a left frontotemporal region of cerebral dysfunction. There was no seizure or seizure predisposition recorded on this study.   ECHO: - Hyperdynamic LV function; moderate LVH; grade 1 diastolic dysfunction; probable liver cyst; suggest abdominal ultrasound to further assess.    Discharge Exam: Filed Weights   11/16/14 1804  Weight: 68.901 kg (151 lb 14.4 oz)   Filed Vitals:   11/20/14 1428  BP: 111/70  Pulse: 72  Temp: 98.3 F (36.8 C)  Resp: 18    General: AAO x 3, no distress Cardiovascular: RRR, no murmurs  Respiratory: clear to auscultation bilaterally GI: soft, non-tender, non-distended, bowel sound positive  Discharge Instructions You were cared for by a hospitalist during your hospital stay. If you have any questions about your discharge medications or the care you received while you were in the hospital after you are discharged, you can call the unit and asked to  speak with the hospitalist on call if the hospitalist that took care of you is not available. Once you are discharged, your primary care physician will handle any further medical  issues. Please note that NO REFILLS for any discharge medications will be authorized once you are discharged, as it is imperative that you return to your primary care physician (or establish a relationship with a primary care physician if you do not have one) for your aftercare needs so that they can reassess your need for medications and monitor your lab values.      Discharge Instructions    Diet - low sodium heart healthy    Complete by:  As directed      Increase activity slowly    Complete by:  As directed             Medication List    TAKE these medications        aspirin EC 325 MG tablet  Take 1 tablet (325 mg total) by mouth daily.     atorvastatin 20 MG tablet  Commonly known as:  LIPITOR  Take 1 tablet (20 mg total) by mouth daily at 6 PM.     FLUoxetine 20 MG tablet  Commonly known as:  PROZAC  Take 20 mg by mouth daily.     haloperidol 1 MG tablet  Commonly known as:  HALDOL  Take 1 mg by mouth 2 (two) times daily.       Allergies  Allergen Reactions  . Floxin [Ofloxacin]   . Haldol [Haloperidol Lactate]   . Codeine Hives and Rash      The results of significant diagnostics from this hospitalization (including imaging, microbiology, ancillary and laboratory) are listed below for reference.    Significant Diagnostic Studies: Dg Chest 2 View  11/16/2014   CLINICAL DATA:  Stroke  EXAM: CHEST  2 VIEW  COMPARISON:  04/08/2013  FINDINGS: Normal heart size. Clear lungs. No pneumothorax. No pleural effusion.  IMPRESSION: No active cardiopulmonary disease.   Electronically Signed   By: Maryclare Bean M.D.   On: 11/16/2014 19:33   Ct Head Wo Contrast  11/16/2014   CLINICAL DATA:  Right facial droop  EXAM: CT HEAD WITHOUT CONTRAST  TECHNIQUE: Contiguous axial images were obtained from the base of the skull through the vertex without intravenous contrast.  COMPARISON:  None.  FINDINGS: Global atrophy. Extensive chronic ischemic changes in the periventricular white  matter and basal ganglia. No mass effect, midline shift, or acute intracranial hemorrhage. No evidence of hyperdense MCA. Partial opacification of left posterior ethmoid air cells.  IMPRESSION: No acute intracranial pathology.  Chronic changes are noted.   Electronically Signed   By: Maryclare Bean M.D.   On: 11/16/2014 15:45   Mr Brain Wo Contrast  11/17/2014   ADDENDUM REPORT: 11/17/2014 16:36  ADDENDUM: Study discussed by telephone with Dr. Osvaldo Shipper on 11/17/2014 at 16:34 .   Electronically Signed   By: Augusto Gamble M.D.   On: 11/17/2014 16:36   11/17/2014   CLINICAL DATA:  63 year old male with severe confusion, loss of speech and right facial droop. Initial encounter. Current history of Huntington chorea.  EXAM: MRI HEAD WITHOUT CONTRAST  MRA HEAD WITHOUT CONTRAST  TECHNIQUE: Multiplanar, multiecho pulse sequences of the brain and surrounding structures were obtained without intravenous contrast. Angiographic images of the head were obtained using MRA technique without contrast.  COMPARISON:  Head CT without contrast 11/16/2014.  FINDINGS: MRI HEAD FINDINGS  The examination had  to be discontinued prior to completion due to patient agitation.  Scattered Patchy and confluent areas of restricted diffusion in the left hemisphere, with confluent involvement of the left basal ganglia and posterior left temporal lobe (series 3).  No contralateral or posterior fossa restricted diffusion.  Cerebral volume loss with probable ex vacuo ventricular enlargement. No midline shift. T2 hyperintensity in the acutely affected areas compatible with cytotoxic edema. No intracranial mass effect.  MRA HEAD FINDINGS  Study is moderately degraded by motion artifact despite repeated imaging attempts.  Antegrade flow in the posterior circulation which appears to be supplied via the left vertebral artery. The basilar artery is patent. The PCA origins are patent.  Antegrade flow in both ICA siphons. Both carotid termini are patent. The  right MCA M1 segment is patent, but the left M1 segment appears occluded about 8 mm beyond its origin. Both ACA origins are patent.  IMPRESSION: 1. Truncated and motion degraded exam due to patient agitation. 2. 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 No evidence of hemorrhage or mass effect at this time.  Electronically Signed: By: Augusto Gamble M.D. On: 11/17/2014 16:31   Dg Swallowing Func-speech Pathology  11/18/2014   Breck Coons [place], CCC-SLP     11/18/2014 11:55 AM Objective Swallowing Evaluation: Modified Barium Swallowing Study   Patient Details  Name: Eric Waters MRN: [id number] Date of Birth: 07/14/1951  Today's Date: 11/18/2014 Time: 5366-4403 SLP Time Calculation (min) (ACUTE ONLY): 18 min  Past Medical History:  Past Medical History  Diagnosis Date  . Allergy   . GERD (gastroesophageal reflux disease)   . Huntington chorea     previously followed at [facility]- Dr Boris Sharper [phone]  [name])  . Chronic diarrhea    Past Surgical History:  Past Surgical History  Procedure Laterality Date  . Tonsillectomy  1959   HPI:  63 y.o. male with history of Huntington's Chorea, GERD admitted  with inability to speak. MRI Left MCA M1 segment occlusion with  patchy acute infarcts in the left MCA territory.  BSE recommended  MBS.     Assessment / Plan / Recommendation Clinical Impression  Dysphagia Diagnosis: Moderate oral phase dysphagia;Moderate  pharyngeal phase dysphagia Clinical impression: Pt. exhbited moderate oral and pharyngeal  dysphagia with sensory > motor impairments.  Decreased lingual  strength led to moderate oral residue spilling to valleculae and  filling pyriform sinuses frequently throughout study with  decreased awareness. Laryngeal penetration to vocal cords with  cup sip nectar (large sip despite frequent therapeutic cues for  small sips). Mild-moderate pyriform sinsus residue due to  decreased laryngeal elevation.  Nectar thick consistency poses  a  greater aspiration risk and SLP recommends honey thick liquids  and Dys 1 texture, 2 swallows, SMALL sips and bites, full  supervision due to cognitive impairments and pills crushed.  ST  will continue to follow.     Treatment Recommendation  Therapy as outlined in treatment plan below    Diet Recommendation Dysphagia 1 (Puree);Honey-thick liquid   Liquid Administration via: Cup;No straw Medication Administration: Crushed with puree Supervision: Patient able to self feed;Staff to assist with self  feeding;Full supervision/cueing for compensatory strategies Compensations: Slow rate;Small sips/bites;Multiple dry swallows  after each bite/sip;Check for pocketing Postural Changes and/or Swallow Maneuvers: Seated upright 90  degrees;Upright 30-60 min after meal    Other  Recommendations Recommended Consults: MBS Oral Care Recommendations: Oral care BID Other Recommendations: Order thickener from pharmacy   Follow  Up Recommendations   (TBD)    Frequency and Duration min 2x/week  2 weeks   Pertinent Vitals/Pain No evidence pain         Reason for Referral Objectively evaluate swallowing function   Oral Phase Oral Preparation/Oral Phase Oral Phase: Impaired Oral - Honey Oral - Honey Teaspoon: Delayed oral transit;Weak lingual  manipulation Oral - Honey Cup: Weak lingual manipulation;Delayed oral transit Oral - Nectar Oral - Nectar Teaspoon: Weak lingual manipulation;Reduced  posterior propulsion;Lingual/palatal residue Oral - Nectar Cup: Weak lingual manipulation;Reduced posterior  propulsion;Lingual/palatal residue Oral - Solids Oral - Regular: Delayed oral transit;Weak lingual  manipulation;Impaired mastication   Pharyngeal Phase Pharyngeal Phase Pharyngeal Phase: Impaired Pharyngeal - Honey Pharyngeal - Honey Teaspoon: Within functional limits Pharyngeal - Honey Cup: Pharyngeal residue - valleculae;Reduced  tongue base retraction (min-mild) Pharyngeal - Nectar Pharyngeal - Nectar Teaspoon: Delayed swallow   initiation;Premature spillage to valleculae Pharyngeal - Nectar Cup: Penetration/Aspiration during  swallow;Pharyngeal residue - pyriform sinuses;Reduced laryngeal  elevation Penetration/Aspiration details (nectar cup): Material enters  airway, CONTACTS cords and not ejected out Pharyngeal - Thin Pharyngeal - Thin Teaspoon: Delayed swallow initiation;Premature  spillage to pyriform sinuses Pharyngeal - Solids Pharyngeal - Puree: Delayed swallow initiation;Premature spillage  to valleculae Pharyngeal - Regular: Within functional limits  Cervical Esophageal Phase    GO    Cervical Esophageal Phase Cervical Esophageal Phase: Leonarda SalonWFL         Royce MacadamiaLitaker, Lisa Willis 11/18/2014, 11:54 AM  [phone]    Mr Maxine GlennMra Head/brain Wo Cm  11/17/2014   ADDENDUM REPORT: 11/17/2014 16:36  ADDENDUM: Study discussed by telephone with Dr. Osvaldo ShipperGokul Krishnan on 11/17/2014 at 16:34 .   Electronically Signed   By: Augusto GambleLee  Hall M.D.   On: 11/17/2014 16:36   11/17/2014   CLINICAL DATA:  63 year old male with severe confusion, loss of speech and right facial droop. Initial encounter. Current history of Huntington chorea.  EXAM: MRI HEAD WITHOUT CONTRAST  MRA HEAD WITHOUT CONTRAST  TECHNIQUE: Multiplanar, multiecho pulse sequences of the brain and surrounding structures were obtained without intravenous contrast. Angiographic images of the head were obtained using MRA technique without contrast.  COMPARISON:  Head CT without contrast 11/16/2014.  FINDINGS: MRI HEAD FINDINGS  The examination had to be discontinued prior to completion due to patient agitation.  Scattered Patchy and confluent areas of restricted diffusion in the left hemisphere, with confluent involvement of the left basal ganglia and posterior left temporal lobe (series 3).  No contralateral or posterior fossa restricted diffusion.  Cerebral volume loss with probable ex vacuo ventricular enlargement. No midline shift. T2 hyperintensity in the acutely affected areas compatible with cytotoxic  edema. No intracranial mass effect.  MRA HEAD FINDINGS  Study is moderately degraded by motion artifact despite repeated imaging attempts.  Antegrade flow in the posterior circulation which appears to be supplied via the left vertebral artery. The basilar artery is patent. The PCA origins are patent.  Antegrade flow in both ICA siphons. Both carotid termini are patent. The right MCA M1 segment is patent, but the left M1 segment appears occluded about 8 mm beyond its origin. Both ACA origins are patent.  IMPRESSION: 1. Truncated and motion degraded exam due to patient agitation. 2. 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 No evidence of hemorrhage or mass effect at this time.  Electronically Signed: By: Augusto GambleLee  Hall M.D. On: 11/17/2014 16:31    Microbiology: No results found for this or any previous visit (from  the past 240 hour(s)).   Labs: Basic Metabolic Panel:  Recent Labs Lab 11/16/14 1420 11/16/14 1432 11/18/14 0421  NA 136 139 140  K 4.3 4.3 4.0  CL 104 104 108  CO2 22  --  23  GLUCOSE 114* 112* 77  BUN 9 10 12   CREATININE 1.33 1.10 1.13  CALCIUM 9.2  --  8.9   Liver Function Tests:  Recent Labs Lab 11/16/14 1420  AST 20  ALT 12  ALKPHOS 121*  BILITOT 0.8  PROT 6.9  ALBUMIN 3.7   No results for input(s): LIPASE, AMYLASE in the last 168 hours. No results for input(s): AMMONIA in the last 168 hours. CBC:  Recent Labs Lab 11/16/14 1420 11/16/14 1432 11/18/14 0421  WBC 6.3  --  5.0  NEUTROABS 4.6  --   --   HGB 14.2 16.3 13.4  HCT 42.3 48.0 40.3  MCV 87.0  --  88.0  PLT 236  --  261   Cardiac Enzymes: No results for input(s): CKTOTAL, CKMB, CKMBINDEX, TROPONINI in the last 168 hours. BNP: BNP (last 3 results) No results for input(s): PROBNP in the last 8760 hours. CBG:  Recent Labs Lab 11/16/14 1411  GLUCAP 102*       SignedCalvert Cantor:  Mariella Blackwelder, MD [facility] 11/20/2014, 2:45 PM

## 2014-11-20 NOTE — Clinical Social Work Placement (Addendum)
    Clinical Social Work Department CLINICAL SOCIAL WORK PLACEMENT NOTE 11/20/2014  Patient:  Eric Waters,Eric Waters  Account Number:  [id number] Admit date:  11/16/2014  Clinical Social Worker:  Derenda FennelBASHIRA Destiny Hagin, CLINICAL SOCIAL WORKER  Date/time:  11/20/2014 01:08 PM  Clinical Social Work is seeking post-discharge placement for this patient at the following level of care:   SKILLED NURSING   (*CSW will update this form in Epic as items are completed)   11/20/2014  Patient/family provided with [facility] Department of Clinical Social Work's list of facilities offering this level of care within the geographic area requested by the patient (or if unable, by the patient's family).  11/20/2014  Patient/family informed of their freedom to choose among providers that offer the needed level of care, that participate in Medicare, Medicaid or managed care program needed by the patient, have an available bed and are willing to accept the patient.  11/20/2014  Patient/family informed of [facility]' ownership interest in [facility], as well as of the fact that they are under no obligation to receive care at this facility.  PASARR submitted to EDS on 11/20/2014 PASARR number received on 11/20/2014  FL2 transmitted to all facilities in geographic area requested by pt/family on  11/20/2014 FL2 transmitted to all facilities within larger geographic area on   Patient informed that his/her managed care company has contracts with or will negotiate with  certain facilities, including the following:   YES     Patient/family informed of bed offers received:  11/20/2014 [facility] Physician recommends and patient chooses bed at    [facility] on  11/20/2014 Patient to be transferred to facility by [facility] Patient and family notified of transfer on 11/20/2014 Name of family member notified:  Eric MilksPreston  Waters  The following physician request were entered in Epic:   Additional Comments:  Derenda FennelBashira Rhyann Berton, MSW, LCSWA [phone] 11/20/2014 1:11 PM

## 2014-11-20 NOTE — Clinical Social Work Note (Signed)
Patient has been discharged. [facility]. Clinical Social Worker currently awaiting [employer] insurance authorization. [employer] agent reported she will have approval today after review and will contact CSW with approval information.   Pt's son will meet with [facility] admissions office to complete admissions paperwork [facility].  FL-2 on chart for MD signature.   Eric Waters, MSW, LCSWA [phone] 11/20/2014 2:55 PM

## 2014-11-20 NOTE — Clinical Social Work Psychosocial (Addendum)
Clinical Social Work Department BRIEF PSYCHOSOCIAL ASSESSMENT 11/20/2014  Patient:  Eric Waters, Eric Waters     Account Number:  [id number]     Admit date:  11/16/2014  Clinical Social Worker:  Glendon Axe, CLINICAL SOCIAL WORKER  Date/Time:  11/20/2014 12:52 PM  Referred by:  Physician  Date Referred:  11/20/2014 Referred for  SNF Placement   Other Referral:   Interview type:  Other - See comment Other interview type:   CSW met with patient's son, Eric Waters at bedside.    PSYCHOSOCIAL DATA Living Status:  FAMILY Admitted from facility:   Level of care:   Primary support name:  Vivia Budge Primary support relationship to patient:  CHILD, ADULT Degree of support available:   Strong    CURRENT CONCERNS Current Concerns  Post-Acute Placement   Other Concerns:    SOCIAL WORK ASSESSMENT / PLAN Clinical Social Worker met with patient's son, Eric Waters in reference to post-acute placement for SNF. CSW explained CSW role and SNF process. CSW also provided and reviewed bed offers on SNF list. Pt's son reported that medically patient is progressing and further stated he is agreeable to SNF placement for today. Pt's son chooses bed at [facility]. CSW explained discharge plans. Pt's son plans to complete admissions paperwork at 3 PM at facility. CSW notified SNF. CSW has submitted clinicals to [facility] for review and updated insurance company with facility. CSW will continue to follow pt and pt's family for continued support and to facilitate pt's discharge needs once.   Assessment/plan status:  Psychosocial Support/Ongoing Assessment of Needs Other assessment/ plan:   Information/referral to community resources:   SNF information/ list provided.    PATIENT'S/FAMILY'S RESPONSE TO PLAN OF CARE: Pt is alert/ disoriented and smiling. Pt is sitting up in bed feeding himself and speaking to relatives. Pt and pt's son agreeable to SNF placement and appreicated social work  intervention.   Glendon Axe, MSW, LCSWA [phone] 11/20/2014 1:08 PM

## 2014-11-20 NOTE — Clinical Social Work Note (Signed)
Clinical Social Worker contacted [employer] agent, Lucious GrovesKesha Fisher to check status of [employer] approval and left a message.   DC packet with required documents is on chart for transport. CSW currently awaiting insurance approval from [facility].   Eric Waters, MSW, LCSWA [phone] 11/20/2014 3:49 PM

## 2014-11-23 DIAGNOSIS — M6281 Muscle weakness (generalized): Secondary | ICD-10-CM | POA: Diagnosis not present

## 2014-11-23 DIAGNOSIS — R482 Apraxia: Secondary | ICD-10-CM | POA: Diagnosis not present

## 2014-11-23 DIAGNOSIS — M545 Low back pain: Secondary | ICD-10-CM | POA: Diagnosis not present

## 2014-11-23 DIAGNOSIS — R4789 Other speech disturbances: Secondary | ICD-10-CM | POA: Diagnosis not present

## 2014-11-23 DIAGNOSIS — I639 Cerebral infarction, unspecified: Secondary | ICD-10-CM | POA: Diagnosis not present

## 2014-11-23 DIAGNOSIS — G10 Huntington's disease: Secondary | ICD-10-CM | POA: Diagnosis not present

## 2014-11-23 DIAGNOSIS — I69320 Aphasia following cerebral infarction: Secondary | ICD-10-CM | POA: Diagnosis not present

## 2014-11-23 DIAGNOSIS — R4701 Aphasia: Secondary | ICD-10-CM | POA: Diagnosis not present

## 2014-11-23 DIAGNOSIS — R2689 Other abnormalities of gait and mobility: Secondary | ICD-10-CM | POA: Diagnosis not present

## 2014-11-23 DIAGNOSIS — R1312 Dysphagia, oropharyngeal phase: Secondary | ICD-10-CM | POA: Diagnosis not present

## 2014-11-23 DIAGNOSIS — M25552 Pain in left hip: Secondary | ICD-10-CM | POA: Diagnosis not present

## 2014-11-23 DIAGNOSIS — M25551 Pain in right hip: Secondary | ICD-10-CM | POA: Diagnosis not present

## 2014-11-23 DIAGNOSIS — F339 Major depressive disorder, recurrent, unspecified: Secondary | ICD-10-CM | POA: Diagnosis not present

## 2014-11-23 DIAGNOSIS — I6350 Cerebral infarction due to unspecified occlusion or stenosis of unspecified cerebral artery: Secondary | ICD-10-CM | POA: Diagnosis not present

## 2014-11-23 DIAGNOSIS — F0390 Unspecified dementia without behavioral disturbance: Secondary | ICD-10-CM | POA: Diagnosis not present

## 2014-11-23 DIAGNOSIS — N183 Chronic kidney disease, stage 3 (moderate): Secondary | ICD-10-CM | POA: Diagnosis not present

## 2014-11-23 DIAGNOSIS — I69391 Dysphagia following cerebral infarction: Secondary | ICD-10-CM | POA: Diagnosis not present

## 2014-11-23 DIAGNOSIS — I82403 Acute embolism and thrombosis of unspecified deep veins of lower extremity, bilateral: Secondary | ICD-10-CM | POA: Diagnosis not present

## 2014-11-23 DIAGNOSIS — I69390 Apraxia following cerebral infarction: Secondary | ICD-10-CM | POA: Diagnosis not present

## 2014-11-30 DIAGNOSIS — R4789 Other speech disturbances: Secondary | ICD-10-CM | POA: Diagnosis not present

## 2014-11-30 DIAGNOSIS — G10 Huntington's disease: Secondary | ICD-10-CM | POA: Diagnosis not present

## 2014-11-30 DIAGNOSIS — I6350 Cerebral infarction due to unspecified occlusion or stenosis of unspecified cerebral artery: Secondary | ICD-10-CM | POA: Diagnosis not present

## 2014-11-30 DIAGNOSIS — F339 Major depressive disorder, recurrent, unspecified: Secondary | ICD-10-CM | POA: Diagnosis not present

## 2014-12-24 ENCOUNTER — Ambulatory Visit: Payer: Self-pay | Admitting: Internal Medicine

## 2014-12-27 DIAGNOSIS — R4701 Aphasia: Secondary | ICD-10-CM | POA: Diagnosis not present

## 2014-12-27 DIAGNOSIS — F0390 Unspecified dementia without behavioral disturbance: Secondary | ICD-10-CM | POA: Diagnosis not present

## 2014-12-27 DIAGNOSIS — G10 Huntington's disease: Secondary | ICD-10-CM | POA: Diagnosis not present

## 2014-12-27 DIAGNOSIS — I82403 Acute embolism and thrombosis of unspecified deep veins of lower extremity, bilateral: Secondary | ICD-10-CM | POA: Diagnosis not present

## 2014-12-27 DIAGNOSIS — I639 Cerebral infarction, unspecified: Secondary | ICD-10-CM | POA: Diagnosis not present

## 2015-01-01 DIAGNOSIS — R4701 Aphasia: Secondary | ICD-10-CM | POA: Diagnosis not present

## 2015-01-01 DIAGNOSIS — R482 Apraxia: Secondary | ICD-10-CM | POA: Diagnosis not present

## 2015-01-01 DIAGNOSIS — I69320 Aphasia following cerebral infarction: Secondary | ICD-10-CM | POA: Diagnosis not present

## 2015-01-01 DIAGNOSIS — G10 Huntington's disease: Secondary | ICD-10-CM | POA: Diagnosis not present

## 2015-01-01 DIAGNOSIS — N183 Chronic kidney disease, stage 3 (moderate): Secondary | ICD-10-CM | POA: Diagnosis not present

## 2015-01-01 DIAGNOSIS — I69391 Dysphagia following cerebral infarction: Secondary | ICD-10-CM | POA: Diagnosis not present

## 2015-01-01 DIAGNOSIS — R1312 Dysphagia, oropharyngeal phase: Secondary | ICD-10-CM | POA: Diagnosis not present

## 2015-01-01 DIAGNOSIS — R2689 Other abnormalities of gait and mobility: Secondary | ICD-10-CM | POA: Diagnosis not present

## 2015-01-01 DIAGNOSIS — I69390 Apraxia following cerebral infarction: Secondary | ICD-10-CM | POA: Diagnosis not present

## 2015-01-01 DIAGNOSIS — M6281 Muscle weakness (generalized): Secondary | ICD-10-CM | POA: Diagnosis not present

## 2015-01-02 DIAGNOSIS — R2689 Other abnormalities of gait and mobility: Secondary | ICD-10-CM | POA: Diagnosis not present

## 2015-01-02 DIAGNOSIS — R4701 Aphasia: Secondary | ICD-10-CM | POA: Diagnosis not present

## 2015-01-02 DIAGNOSIS — G10 Huntington's disease: Secondary | ICD-10-CM | POA: Diagnosis not present

## 2015-01-02 DIAGNOSIS — I69390 Apraxia following cerebral infarction: Secondary | ICD-10-CM | POA: Diagnosis not present

## 2015-01-02 DIAGNOSIS — R1312 Dysphagia, oropharyngeal phase: Secondary | ICD-10-CM | POA: Diagnosis not present

## 2015-01-02 DIAGNOSIS — I69391 Dysphagia following cerebral infarction: Secondary | ICD-10-CM | POA: Diagnosis not present

## 2015-01-02 DIAGNOSIS — N183 Chronic kidney disease, stage 3 (moderate): Secondary | ICD-10-CM | POA: Diagnosis not present

## 2015-01-02 DIAGNOSIS — R482 Apraxia: Secondary | ICD-10-CM | POA: Diagnosis not present

## 2015-01-02 DIAGNOSIS — I69320 Aphasia following cerebral infarction: Secondary | ICD-10-CM | POA: Diagnosis not present

## 2015-01-02 DIAGNOSIS — M6281 Muscle weakness (generalized): Secondary | ICD-10-CM | POA: Diagnosis not present

## 2015-01-03 DIAGNOSIS — M6281 Muscle weakness (generalized): Secondary | ICD-10-CM | POA: Diagnosis not present

## 2015-01-03 DIAGNOSIS — R2689 Other abnormalities of gait and mobility: Secondary | ICD-10-CM | POA: Diagnosis not present

## 2015-01-03 DIAGNOSIS — I69391 Dysphagia following cerebral infarction: Secondary | ICD-10-CM | POA: Diagnosis not present

## 2015-01-03 DIAGNOSIS — I69390 Apraxia following cerebral infarction: Secondary | ICD-10-CM | POA: Diagnosis not present

## 2015-01-03 DIAGNOSIS — R4701 Aphasia: Secondary | ICD-10-CM | POA: Diagnosis not present

## 2015-01-03 DIAGNOSIS — G10 Huntington's disease: Secondary | ICD-10-CM | POA: Diagnosis not present

## 2015-01-03 DIAGNOSIS — N183 Chronic kidney disease, stage 3 (moderate): Secondary | ICD-10-CM | POA: Diagnosis not present

## 2015-01-03 DIAGNOSIS — I69320 Aphasia following cerebral infarction: Secondary | ICD-10-CM | POA: Diagnosis not present

## 2015-01-03 DIAGNOSIS — R482 Apraxia: Secondary | ICD-10-CM | POA: Diagnosis not present

## 2015-01-03 DIAGNOSIS — R1312 Dysphagia, oropharyngeal phase: Secondary | ICD-10-CM | POA: Diagnosis not present

## 2015-01-04 DIAGNOSIS — I69391 Dysphagia following cerebral infarction: Secondary | ICD-10-CM | POA: Diagnosis not present

## 2015-01-04 DIAGNOSIS — I69320 Aphasia following cerebral infarction: Secondary | ICD-10-CM | POA: Diagnosis not present

## 2015-01-04 DIAGNOSIS — R2689 Other abnormalities of gait and mobility: Secondary | ICD-10-CM | POA: Diagnosis not present

## 2015-01-04 DIAGNOSIS — G10 Huntington's disease: Secondary | ICD-10-CM | POA: Diagnosis not present

## 2015-01-04 DIAGNOSIS — R4701 Aphasia: Secondary | ICD-10-CM | POA: Diagnosis not present

## 2015-01-04 DIAGNOSIS — R482 Apraxia: Secondary | ICD-10-CM | POA: Diagnosis not present

## 2015-01-04 DIAGNOSIS — N183 Chronic kidney disease, stage 3 (moderate): Secondary | ICD-10-CM | POA: Diagnosis not present

## 2015-01-04 DIAGNOSIS — M6281 Muscle weakness (generalized): Secondary | ICD-10-CM | POA: Diagnosis not present

## 2015-01-04 DIAGNOSIS — R1312 Dysphagia, oropharyngeal phase: Secondary | ICD-10-CM | POA: Diagnosis not present

## 2015-01-04 DIAGNOSIS — I69390 Apraxia following cerebral infarction: Secondary | ICD-10-CM | POA: Diagnosis not present

## 2015-01-05 DIAGNOSIS — R2689 Other abnormalities of gait and mobility: Secondary | ICD-10-CM | POA: Diagnosis not present

## 2015-01-05 DIAGNOSIS — I69391 Dysphagia following cerebral infarction: Secondary | ICD-10-CM | POA: Diagnosis not present

## 2015-01-05 DIAGNOSIS — R4701 Aphasia: Secondary | ICD-10-CM | POA: Diagnosis not present

## 2015-01-05 DIAGNOSIS — I69390 Apraxia following cerebral infarction: Secondary | ICD-10-CM | POA: Diagnosis not present

## 2015-01-05 DIAGNOSIS — I69320 Aphasia following cerebral infarction: Secondary | ICD-10-CM | POA: Diagnosis not present

## 2015-01-05 DIAGNOSIS — R1312 Dysphagia, oropharyngeal phase: Secondary | ICD-10-CM | POA: Diagnosis not present

## 2015-01-05 DIAGNOSIS — G10 Huntington's disease: Secondary | ICD-10-CM | POA: Diagnosis not present

## 2015-01-05 DIAGNOSIS — N183 Chronic kidney disease, stage 3 (moderate): Secondary | ICD-10-CM | POA: Diagnosis not present

## 2015-01-05 DIAGNOSIS — M6281 Muscle weakness (generalized): Secondary | ICD-10-CM | POA: Diagnosis not present

## 2015-01-05 DIAGNOSIS — R482 Apraxia: Secondary | ICD-10-CM | POA: Diagnosis not present

## 2015-01-06 DIAGNOSIS — I69390 Apraxia following cerebral infarction: Secondary | ICD-10-CM | POA: Diagnosis not present

## 2015-01-06 DIAGNOSIS — R482 Apraxia: Secondary | ICD-10-CM | POA: Diagnosis not present

## 2015-01-06 DIAGNOSIS — R2689 Other abnormalities of gait and mobility: Secondary | ICD-10-CM | POA: Diagnosis not present

## 2015-01-06 DIAGNOSIS — R4701 Aphasia: Secondary | ICD-10-CM | POA: Diagnosis not present

## 2015-01-06 DIAGNOSIS — M6281 Muscle weakness (generalized): Secondary | ICD-10-CM | POA: Diagnosis not present

## 2015-01-06 DIAGNOSIS — I69391 Dysphagia following cerebral infarction: Secondary | ICD-10-CM | POA: Diagnosis not present

## 2015-01-06 DIAGNOSIS — G10 Huntington's disease: Secondary | ICD-10-CM | POA: Diagnosis not present

## 2015-01-06 DIAGNOSIS — R1312 Dysphagia, oropharyngeal phase: Secondary | ICD-10-CM | POA: Diagnosis not present

## 2015-01-06 DIAGNOSIS — I69320 Aphasia following cerebral infarction: Secondary | ICD-10-CM | POA: Diagnosis not present

## 2015-01-06 DIAGNOSIS — N183 Chronic kidney disease, stage 3 (moderate): Secondary | ICD-10-CM | POA: Diagnosis not present

## 2015-01-08 DIAGNOSIS — I69320 Aphasia following cerebral infarction: Secondary | ICD-10-CM | POA: Diagnosis not present

## 2015-01-08 DIAGNOSIS — I69390 Apraxia following cerebral infarction: Secondary | ICD-10-CM | POA: Diagnosis not present

## 2015-01-08 DIAGNOSIS — R4701 Aphasia: Secondary | ICD-10-CM | POA: Diagnosis not present

## 2015-01-08 DIAGNOSIS — M6281 Muscle weakness (generalized): Secondary | ICD-10-CM | POA: Diagnosis not present

## 2015-01-08 DIAGNOSIS — R2689 Other abnormalities of gait and mobility: Secondary | ICD-10-CM | POA: Diagnosis not present

## 2015-01-08 DIAGNOSIS — R1312 Dysphagia, oropharyngeal phase: Secondary | ICD-10-CM | POA: Diagnosis not present

## 2015-01-08 DIAGNOSIS — R482 Apraxia: Secondary | ICD-10-CM | POA: Diagnosis not present

## 2015-01-08 DIAGNOSIS — I69391 Dysphagia following cerebral infarction: Secondary | ICD-10-CM | POA: Diagnosis not present

## 2015-01-08 DIAGNOSIS — G10 Huntington's disease: Secondary | ICD-10-CM | POA: Diagnosis not present

## 2015-01-08 DIAGNOSIS — N183 Chronic kidney disease, stage 3 (moderate): Secondary | ICD-10-CM | POA: Diagnosis not present

## 2015-01-09 DIAGNOSIS — N183 Chronic kidney disease, stage 3 (moderate): Secondary | ICD-10-CM | POA: Diagnosis not present

## 2015-01-09 DIAGNOSIS — R4701 Aphasia: Secondary | ICD-10-CM | POA: Diagnosis not present

## 2015-01-09 DIAGNOSIS — G10 Huntington's disease: Secondary | ICD-10-CM | POA: Diagnosis not present

## 2015-01-09 DIAGNOSIS — I69390 Apraxia following cerebral infarction: Secondary | ICD-10-CM | POA: Diagnosis not present

## 2015-01-09 DIAGNOSIS — R1312 Dysphagia, oropharyngeal phase: Secondary | ICD-10-CM | POA: Diagnosis not present

## 2015-01-09 DIAGNOSIS — I69320 Aphasia following cerebral infarction: Secondary | ICD-10-CM | POA: Diagnosis not present

## 2015-01-09 DIAGNOSIS — I69391 Dysphagia following cerebral infarction: Secondary | ICD-10-CM | POA: Diagnosis not present

## 2015-01-09 DIAGNOSIS — M6281 Muscle weakness (generalized): Secondary | ICD-10-CM | POA: Diagnosis not present

## 2015-01-09 DIAGNOSIS — R2689 Other abnormalities of gait and mobility: Secondary | ICD-10-CM | POA: Diagnosis not present

## 2015-01-09 DIAGNOSIS — R482 Apraxia: Secondary | ICD-10-CM | POA: Diagnosis not present

## 2015-01-10 DIAGNOSIS — R482 Apraxia: Secondary | ICD-10-CM | POA: Diagnosis not present

## 2015-01-10 DIAGNOSIS — I69320 Aphasia following cerebral infarction: Secondary | ICD-10-CM | POA: Diagnosis not present

## 2015-01-10 DIAGNOSIS — R2689 Other abnormalities of gait and mobility: Secondary | ICD-10-CM | POA: Diagnosis not present

## 2015-01-10 DIAGNOSIS — I69390 Apraxia following cerebral infarction: Secondary | ICD-10-CM | POA: Diagnosis not present

## 2015-01-10 DIAGNOSIS — G10 Huntington's disease: Secondary | ICD-10-CM | POA: Diagnosis not present

## 2015-01-10 DIAGNOSIS — R1312 Dysphagia, oropharyngeal phase: Secondary | ICD-10-CM | POA: Diagnosis not present

## 2015-01-10 DIAGNOSIS — R4701 Aphasia: Secondary | ICD-10-CM | POA: Diagnosis not present

## 2015-01-10 DIAGNOSIS — M6281 Muscle weakness (generalized): Secondary | ICD-10-CM | POA: Diagnosis not present

## 2015-01-10 DIAGNOSIS — N183 Chronic kidney disease, stage 3 (moderate): Secondary | ICD-10-CM | POA: Diagnosis not present

## 2015-01-10 DIAGNOSIS — I69391 Dysphagia following cerebral infarction: Secondary | ICD-10-CM | POA: Diagnosis not present

## 2015-01-11 DIAGNOSIS — I69390 Apraxia following cerebral infarction: Secondary | ICD-10-CM | POA: Diagnosis not present

## 2015-01-11 DIAGNOSIS — R1312 Dysphagia, oropharyngeal phase: Secondary | ICD-10-CM | POA: Diagnosis not present

## 2015-01-11 DIAGNOSIS — N183 Chronic kidney disease, stage 3 (moderate): Secondary | ICD-10-CM | POA: Diagnosis not present

## 2015-01-11 DIAGNOSIS — R2689 Other abnormalities of gait and mobility: Secondary | ICD-10-CM | POA: Diagnosis not present

## 2015-01-11 DIAGNOSIS — R4701 Aphasia: Secondary | ICD-10-CM | POA: Diagnosis not present

## 2015-01-11 DIAGNOSIS — G10 Huntington's disease: Secondary | ICD-10-CM | POA: Diagnosis not present

## 2015-01-11 DIAGNOSIS — I69320 Aphasia following cerebral infarction: Secondary | ICD-10-CM | POA: Diagnosis not present

## 2015-01-11 DIAGNOSIS — R482 Apraxia: Secondary | ICD-10-CM | POA: Diagnosis not present

## 2015-01-11 DIAGNOSIS — M6281 Muscle weakness (generalized): Secondary | ICD-10-CM | POA: Diagnosis not present

## 2015-01-11 DIAGNOSIS — I69391 Dysphagia following cerebral infarction: Secondary | ICD-10-CM | POA: Diagnosis not present

## 2015-01-14 DIAGNOSIS — R2689 Other abnormalities of gait and mobility: Secondary | ICD-10-CM | POA: Diagnosis not present

## 2015-01-14 DIAGNOSIS — N183 Chronic kidney disease, stage 3 (moderate): Secondary | ICD-10-CM | POA: Diagnosis not present

## 2015-01-14 DIAGNOSIS — I69391 Dysphagia following cerebral infarction: Secondary | ICD-10-CM | POA: Diagnosis not present

## 2015-01-14 DIAGNOSIS — I69390 Apraxia following cerebral infarction: Secondary | ICD-10-CM | POA: Diagnosis not present

## 2015-01-14 DIAGNOSIS — R1312 Dysphagia, oropharyngeal phase: Secondary | ICD-10-CM | POA: Diagnosis not present

## 2015-01-14 DIAGNOSIS — I69320 Aphasia following cerebral infarction: Secondary | ICD-10-CM | POA: Diagnosis not present

## 2015-01-14 DIAGNOSIS — G10 Huntington's disease: Secondary | ICD-10-CM | POA: Diagnosis not present

## 2015-01-14 DIAGNOSIS — R4701 Aphasia: Secondary | ICD-10-CM | POA: Diagnosis not present

## 2015-01-14 DIAGNOSIS — R482 Apraxia: Secondary | ICD-10-CM | POA: Diagnosis not present

## 2015-01-14 DIAGNOSIS — M6281 Muscle weakness (generalized): Secondary | ICD-10-CM | POA: Diagnosis not present

## 2015-01-15 DIAGNOSIS — I69391 Dysphagia following cerebral infarction: Secondary | ICD-10-CM | POA: Diagnosis not present

## 2015-01-15 DIAGNOSIS — R2689 Other abnormalities of gait and mobility: Secondary | ICD-10-CM | POA: Diagnosis not present

## 2015-01-15 DIAGNOSIS — I69390 Apraxia following cerebral infarction: Secondary | ICD-10-CM | POA: Diagnosis not present

## 2015-01-15 DIAGNOSIS — M6281 Muscle weakness (generalized): Secondary | ICD-10-CM | POA: Diagnosis not present

## 2015-01-15 DIAGNOSIS — I69320 Aphasia following cerebral infarction: Secondary | ICD-10-CM | POA: Diagnosis not present

## 2015-01-15 DIAGNOSIS — R1312 Dysphagia, oropharyngeal phase: Secondary | ICD-10-CM | POA: Diagnosis not present

## 2015-01-15 DIAGNOSIS — R4701 Aphasia: Secondary | ICD-10-CM | POA: Diagnosis not present

## 2015-01-15 DIAGNOSIS — N183 Chronic kidney disease, stage 3 (moderate): Secondary | ICD-10-CM | POA: Diagnosis not present

## 2015-01-15 DIAGNOSIS — G10 Huntington's disease: Secondary | ICD-10-CM | POA: Diagnosis not present

## 2015-01-15 DIAGNOSIS — R482 Apraxia: Secondary | ICD-10-CM | POA: Diagnosis not present

## 2015-01-16 DIAGNOSIS — G10 Huntington's disease: Secondary | ICD-10-CM | POA: Diagnosis not present

## 2015-01-16 DIAGNOSIS — I69320 Aphasia following cerebral infarction: Secondary | ICD-10-CM | POA: Diagnosis not present

## 2015-01-16 DIAGNOSIS — R2689 Other abnormalities of gait and mobility: Secondary | ICD-10-CM | POA: Diagnosis not present

## 2015-01-16 DIAGNOSIS — I69390 Apraxia following cerebral infarction: Secondary | ICD-10-CM | POA: Diagnosis not present

## 2015-01-16 DIAGNOSIS — N183 Chronic kidney disease, stage 3 (moderate): Secondary | ICD-10-CM | POA: Diagnosis not present

## 2015-01-16 DIAGNOSIS — R482 Apraxia: Secondary | ICD-10-CM | POA: Diagnosis not present

## 2015-01-16 DIAGNOSIS — R4701 Aphasia: Secondary | ICD-10-CM | POA: Diagnosis not present

## 2015-01-16 DIAGNOSIS — R1312 Dysphagia, oropharyngeal phase: Secondary | ICD-10-CM | POA: Diagnosis not present

## 2015-01-16 DIAGNOSIS — M6281 Muscle weakness (generalized): Secondary | ICD-10-CM | POA: Diagnosis not present

## 2015-01-16 DIAGNOSIS — I69391 Dysphagia following cerebral infarction: Secondary | ICD-10-CM | POA: Diagnosis not present

## 2015-01-17 DIAGNOSIS — R4701 Aphasia: Secondary | ICD-10-CM | POA: Diagnosis not present

## 2015-01-17 DIAGNOSIS — M6281 Muscle weakness (generalized): Secondary | ICD-10-CM | POA: Diagnosis not present

## 2015-01-17 DIAGNOSIS — R482 Apraxia: Secondary | ICD-10-CM | POA: Diagnosis not present

## 2015-01-17 DIAGNOSIS — G10 Huntington's disease: Secondary | ICD-10-CM | POA: Diagnosis not present

## 2015-01-17 DIAGNOSIS — R1312 Dysphagia, oropharyngeal phase: Secondary | ICD-10-CM | POA: Diagnosis not present

## 2015-01-17 DIAGNOSIS — R2689 Other abnormalities of gait and mobility: Secondary | ICD-10-CM | POA: Diagnosis not present

## 2015-01-17 DIAGNOSIS — I69390 Apraxia following cerebral infarction: Secondary | ICD-10-CM | POA: Diagnosis not present

## 2015-01-17 DIAGNOSIS — I69320 Aphasia following cerebral infarction: Secondary | ICD-10-CM | POA: Diagnosis not present

## 2015-01-17 DIAGNOSIS — I69391 Dysphagia following cerebral infarction: Secondary | ICD-10-CM | POA: Diagnosis not present

## 2015-01-17 DIAGNOSIS — N183 Chronic kidney disease, stage 3 (moderate): Secondary | ICD-10-CM | POA: Diagnosis not present

## 2015-01-19 DIAGNOSIS — I69320 Aphasia following cerebral infarction: Secondary | ICD-10-CM | POA: Diagnosis not present

## 2015-01-19 DIAGNOSIS — G10 Huntington's disease: Secondary | ICD-10-CM | POA: Diagnosis not present

## 2015-01-19 DIAGNOSIS — N183 Chronic kidney disease, stage 3 (moderate): Secondary | ICD-10-CM | POA: Diagnosis not present

## 2015-01-19 DIAGNOSIS — I69391 Dysphagia following cerebral infarction: Secondary | ICD-10-CM | POA: Diagnosis not present

## 2015-01-19 DIAGNOSIS — R482 Apraxia: Secondary | ICD-10-CM | POA: Diagnosis not present

## 2015-01-19 DIAGNOSIS — M6281 Muscle weakness (generalized): Secondary | ICD-10-CM | POA: Diagnosis not present

## 2015-01-19 DIAGNOSIS — R4701 Aphasia: Secondary | ICD-10-CM | POA: Diagnosis not present

## 2015-01-19 DIAGNOSIS — R1312 Dysphagia, oropharyngeal phase: Secondary | ICD-10-CM | POA: Diagnosis not present

## 2015-01-19 DIAGNOSIS — I69390 Apraxia following cerebral infarction: Secondary | ICD-10-CM | POA: Diagnosis not present

## 2015-01-19 DIAGNOSIS — R2689 Other abnormalities of gait and mobility: Secondary | ICD-10-CM | POA: Diagnosis not present

## 2015-01-21 DIAGNOSIS — R2689 Other abnormalities of gait and mobility: Secondary | ICD-10-CM | POA: Diagnosis not present

## 2015-01-21 DIAGNOSIS — I69390 Apraxia following cerebral infarction: Secondary | ICD-10-CM | POA: Diagnosis not present

## 2015-01-21 DIAGNOSIS — M6281 Muscle weakness (generalized): Secondary | ICD-10-CM | POA: Diagnosis not present

## 2015-01-21 DIAGNOSIS — G10 Huntington's disease: Secondary | ICD-10-CM | POA: Diagnosis not present

## 2015-01-21 DIAGNOSIS — I69391 Dysphagia following cerebral infarction: Secondary | ICD-10-CM | POA: Diagnosis not present

## 2015-01-21 DIAGNOSIS — R482 Apraxia: Secondary | ICD-10-CM | POA: Diagnosis not present

## 2015-01-21 DIAGNOSIS — R1312 Dysphagia, oropharyngeal phase: Secondary | ICD-10-CM | POA: Diagnosis not present

## 2015-01-21 DIAGNOSIS — I69320 Aphasia following cerebral infarction: Secondary | ICD-10-CM | POA: Diagnosis not present

## 2015-01-21 DIAGNOSIS — N183 Chronic kidney disease, stage 3 (moderate): Secondary | ICD-10-CM | POA: Diagnosis not present

## 2015-01-21 DIAGNOSIS — R4701 Aphasia: Secondary | ICD-10-CM | POA: Diagnosis not present

## 2015-01-22 DIAGNOSIS — R482 Apraxia: Secondary | ICD-10-CM | POA: Diagnosis not present

## 2015-01-22 DIAGNOSIS — R1312 Dysphagia, oropharyngeal phase: Secondary | ICD-10-CM | POA: Diagnosis not present

## 2015-01-22 DIAGNOSIS — R4701 Aphasia: Secondary | ICD-10-CM | POA: Diagnosis not present

## 2015-01-22 DIAGNOSIS — I69391 Dysphagia following cerebral infarction: Secondary | ICD-10-CM | POA: Diagnosis not present

## 2015-01-22 DIAGNOSIS — N183 Chronic kidney disease, stage 3 (moderate): Secondary | ICD-10-CM | POA: Diagnosis not present

## 2015-01-22 DIAGNOSIS — G10 Huntington's disease: Secondary | ICD-10-CM | POA: Diagnosis not present

## 2015-01-22 DIAGNOSIS — I69390 Apraxia following cerebral infarction: Secondary | ICD-10-CM | POA: Diagnosis not present

## 2015-01-22 DIAGNOSIS — M6281 Muscle weakness (generalized): Secondary | ICD-10-CM | POA: Diagnosis not present

## 2015-01-23 DIAGNOSIS — N183 Chronic kidney disease, stage 3 (moderate): Secondary | ICD-10-CM | POA: Diagnosis not present

## 2015-01-23 DIAGNOSIS — R482 Apraxia: Secondary | ICD-10-CM | POA: Diagnosis not present

## 2015-01-23 DIAGNOSIS — G10 Huntington's disease: Secondary | ICD-10-CM | POA: Diagnosis not present

## 2015-01-23 DIAGNOSIS — I69390 Apraxia following cerebral infarction: Secondary | ICD-10-CM | POA: Diagnosis not present

## 2015-01-23 DIAGNOSIS — M6281 Muscle weakness (generalized): Secondary | ICD-10-CM | POA: Diagnosis not present

## 2015-01-23 DIAGNOSIS — I69391 Dysphagia following cerebral infarction: Secondary | ICD-10-CM | POA: Diagnosis not present

## 2015-01-23 DIAGNOSIS — R1312 Dysphagia, oropharyngeal phase: Secondary | ICD-10-CM | POA: Diagnosis not present

## 2015-01-23 DIAGNOSIS — R4701 Aphasia: Secondary | ICD-10-CM | POA: Diagnosis not present

## 2015-01-24 DIAGNOSIS — R4701 Aphasia: Secondary | ICD-10-CM | POA: Diagnosis not present

## 2015-01-24 DIAGNOSIS — I69391 Dysphagia following cerebral infarction: Secondary | ICD-10-CM | POA: Diagnosis not present

## 2015-01-24 DIAGNOSIS — I69390 Apraxia following cerebral infarction: Secondary | ICD-10-CM | POA: Diagnosis not present

## 2015-01-24 DIAGNOSIS — M6281 Muscle weakness (generalized): Secondary | ICD-10-CM | POA: Diagnosis not present

## 2015-01-24 DIAGNOSIS — R482 Apraxia: Secondary | ICD-10-CM | POA: Diagnosis not present

## 2015-01-24 DIAGNOSIS — R1312 Dysphagia, oropharyngeal phase: Secondary | ICD-10-CM | POA: Diagnosis not present

## 2015-01-24 DIAGNOSIS — N183 Chronic kidney disease, stage 3 (moderate): Secondary | ICD-10-CM | POA: Diagnosis not present

## 2015-01-24 DIAGNOSIS — G10 Huntington's disease: Secondary | ICD-10-CM | POA: Diagnosis not present

## 2015-01-25 DIAGNOSIS — N183 Chronic kidney disease, stage 3 (moderate): Secondary | ICD-10-CM | POA: Diagnosis not present

## 2015-01-25 DIAGNOSIS — I69391 Dysphagia following cerebral infarction: Secondary | ICD-10-CM | POA: Diagnosis not present

## 2015-01-25 DIAGNOSIS — R1312 Dysphagia, oropharyngeal phase: Secondary | ICD-10-CM | POA: Diagnosis not present

## 2015-01-25 DIAGNOSIS — I69390 Apraxia following cerebral infarction: Secondary | ICD-10-CM | POA: Diagnosis not present

## 2015-01-25 DIAGNOSIS — R482 Apraxia: Secondary | ICD-10-CM | POA: Diagnosis not present

## 2015-01-25 DIAGNOSIS — R4701 Aphasia: Secondary | ICD-10-CM | POA: Diagnosis not present

## 2015-01-25 DIAGNOSIS — G10 Huntington's disease: Secondary | ICD-10-CM | POA: Diagnosis not present

## 2015-01-25 DIAGNOSIS — M6281 Muscle weakness (generalized): Secondary | ICD-10-CM | POA: Diagnosis not present

## 2015-01-28 DIAGNOSIS — M6281 Muscle weakness (generalized): Secondary | ICD-10-CM | POA: Diagnosis not present

## 2015-01-28 DIAGNOSIS — R4701 Aphasia: Secondary | ICD-10-CM | POA: Diagnosis not present

## 2015-01-28 DIAGNOSIS — N183 Chronic kidney disease, stage 3 (moderate): Secondary | ICD-10-CM | POA: Diagnosis not present

## 2015-01-28 DIAGNOSIS — G10 Huntington's disease: Secondary | ICD-10-CM | POA: Diagnosis not present

## 2015-01-28 DIAGNOSIS — I69391 Dysphagia following cerebral infarction: Secondary | ICD-10-CM | POA: Diagnosis not present

## 2015-01-28 DIAGNOSIS — R1312 Dysphagia, oropharyngeal phase: Secondary | ICD-10-CM | POA: Diagnosis not present

## 2015-01-28 DIAGNOSIS — R482 Apraxia: Secondary | ICD-10-CM | POA: Diagnosis not present

## 2015-01-28 DIAGNOSIS — I69390 Apraxia following cerebral infarction: Secondary | ICD-10-CM | POA: Diagnosis not present

## 2015-01-29 DIAGNOSIS — R4701 Aphasia: Secondary | ICD-10-CM | POA: Diagnosis not present

## 2015-01-29 DIAGNOSIS — R482 Apraxia: Secondary | ICD-10-CM | POA: Diagnosis not present

## 2015-01-29 DIAGNOSIS — I69391 Dysphagia following cerebral infarction: Secondary | ICD-10-CM | POA: Diagnosis not present

## 2015-01-29 DIAGNOSIS — M6281 Muscle weakness (generalized): Secondary | ICD-10-CM | POA: Diagnosis not present

## 2015-01-29 DIAGNOSIS — G10 Huntington's disease: Secondary | ICD-10-CM | POA: Diagnosis not present

## 2015-01-29 DIAGNOSIS — I69390 Apraxia following cerebral infarction: Secondary | ICD-10-CM | POA: Diagnosis not present

## 2015-01-29 DIAGNOSIS — N183 Chronic kidney disease, stage 3 (moderate): Secondary | ICD-10-CM | POA: Diagnosis not present

## 2015-01-29 DIAGNOSIS — R1312 Dysphagia, oropharyngeal phase: Secondary | ICD-10-CM | POA: Diagnosis not present

## 2015-01-30 DIAGNOSIS — N183 Chronic kidney disease, stage 3 (moderate): Secondary | ICD-10-CM | POA: Diagnosis not present

## 2015-01-30 DIAGNOSIS — I69390 Apraxia following cerebral infarction: Secondary | ICD-10-CM | POA: Diagnosis not present

## 2015-01-30 DIAGNOSIS — M6281 Muscle weakness (generalized): Secondary | ICD-10-CM | POA: Diagnosis not present

## 2015-01-30 DIAGNOSIS — R1312 Dysphagia, oropharyngeal phase: Secondary | ICD-10-CM | POA: Diagnosis not present

## 2015-01-30 DIAGNOSIS — R4701 Aphasia: Secondary | ICD-10-CM | POA: Diagnosis not present

## 2015-01-30 DIAGNOSIS — G10 Huntington's disease: Secondary | ICD-10-CM | POA: Diagnosis not present

## 2015-01-30 DIAGNOSIS — R482 Apraxia: Secondary | ICD-10-CM | POA: Diagnosis not present

## 2015-01-30 DIAGNOSIS — I69391 Dysphagia following cerebral infarction: Secondary | ICD-10-CM | POA: Diagnosis not present

## 2015-01-31 DIAGNOSIS — I69391 Dysphagia following cerebral infarction: Secondary | ICD-10-CM | POA: Diagnosis not present

## 2015-01-31 DIAGNOSIS — R1312 Dysphagia, oropharyngeal phase: Secondary | ICD-10-CM | POA: Diagnosis not present

## 2015-01-31 DIAGNOSIS — M6281 Muscle weakness (generalized): Secondary | ICD-10-CM | POA: Diagnosis not present

## 2015-01-31 DIAGNOSIS — G10 Huntington's disease: Secondary | ICD-10-CM | POA: Diagnosis not present

## 2015-01-31 DIAGNOSIS — N183 Chronic kidney disease, stage 3 (moderate): Secondary | ICD-10-CM | POA: Diagnosis not present

## 2015-01-31 DIAGNOSIS — R482 Apraxia: Secondary | ICD-10-CM | POA: Diagnosis not present

## 2015-01-31 DIAGNOSIS — R4701 Aphasia: Secondary | ICD-10-CM | POA: Diagnosis not present

## 2015-01-31 DIAGNOSIS — I69390 Apraxia following cerebral infarction: Secondary | ICD-10-CM | POA: Diagnosis not present

## 2015-02-01 DIAGNOSIS — R4701 Aphasia: Secondary | ICD-10-CM | POA: Diagnosis not present

## 2015-02-01 DIAGNOSIS — M6281 Muscle weakness (generalized): Secondary | ICD-10-CM | POA: Diagnosis not present

## 2015-02-01 DIAGNOSIS — G10 Huntington's disease: Secondary | ICD-10-CM | POA: Diagnosis not present

## 2015-02-01 DIAGNOSIS — N183 Chronic kidney disease, stage 3 (moderate): Secondary | ICD-10-CM | POA: Diagnosis not present

## 2015-02-01 DIAGNOSIS — I69391 Dysphagia following cerebral infarction: Secondary | ICD-10-CM | POA: Diagnosis not present

## 2015-02-01 DIAGNOSIS — I69390 Apraxia following cerebral infarction: Secondary | ICD-10-CM | POA: Diagnosis not present

## 2015-02-01 DIAGNOSIS — R482 Apraxia: Secondary | ICD-10-CM | POA: Diagnosis not present

## 2015-02-01 DIAGNOSIS — R1312 Dysphagia, oropharyngeal phase: Secondary | ICD-10-CM | POA: Diagnosis not present

## 2015-02-04 DIAGNOSIS — G10 Huntington's disease: Secondary | ICD-10-CM | POA: Diagnosis not present

## 2015-02-04 DIAGNOSIS — R482 Apraxia: Secondary | ICD-10-CM | POA: Diagnosis not present

## 2015-02-04 DIAGNOSIS — N183 Chronic kidney disease, stage 3 (moderate): Secondary | ICD-10-CM | POA: Diagnosis not present

## 2015-02-04 DIAGNOSIS — I69391 Dysphagia following cerebral infarction: Secondary | ICD-10-CM | POA: Diagnosis not present

## 2015-02-04 DIAGNOSIS — M6281 Muscle weakness (generalized): Secondary | ICD-10-CM | POA: Diagnosis not present

## 2015-02-04 DIAGNOSIS — I69390 Apraxia following cerebral infarction: Secondary | ICD-10-CM | POA: Diagnosis not present

## 2015-02-04 DIAGNOSIS — R1312 Dysphagia, oropharyngeal phase: Secondary | ICD-10-CM | POA: Diagnosis not present

## 2015-02-04 DIAGNOSIS — R4701 Aphasia: Secondary | ICD-10-CM | POA: Diagnosis not present

## 2015-02-06 DIAGNOSIS — R4701 Aphasia: Secondary | ICD-10-CM | POA: Diagnosis not present

## 2015-02-06 DIAGNOSIS — R1312 Dysphagia, oropharyngeal phase: Secondary | ICD-10-CM | POA: Diagnosis not present

## 2015-02-06 DIAGNOSIS — I69390 Apraxia following cerebral infarction: Secondary | ICD-10-CM | POA: Diagnosis not present

## 2015-02-06 DIAGNOSIS — G10 Huntington's disease: Secondary | ICD-10-CM | POA: Diagnosis not present

## 2015-02-06 DIAGNOSIS — R482 Apraxia: Secondary | ICD-10-CM | POA: Diagnosis not present

## 2015-02-06 DIAGNOSIS — I69391 Dysphagia following cerebral infarction: Secondary | ICD-10-CM | POA: Diagnosis not present

## 2015-02-06 DIAGNOSIS — N183 Chronic kidney disease, stage 3 (moderate): Secondary | ICD-10-CM | POA: Diagnosis not present

## 2015-02-06 DIAGNOSIS — M6281 Muscle weakness (generalized): Secondary | ICD-10-CM | POA: Diagnosis not present

## 2015-02-07 DIAGNOSIS — N183 Chronic kidney disease, stage 3 (moderate): Secondary | ICD-10-CM | POA: Diagnosis not present

## 2015-02-07 DIAGNOSIS — R1312 Dysphagia, oropharyngeal phase: Secondary | ICD-10-CM | POA: Diagnosis not present

## 2015-02-07 DIAGNOSIS — I69390 Apraxia following cerebral infarction: Secondary | ICD-10-CM | POA: Diagnosis not present

## 2015-02-07 DIAGNOSIS — M6281 Muscle weakness (generalized): Secondary | ICD-10-CM | POA: Diagnosis not present

## 2015-02-07 DIAGNOSIS — I69391 Dysphagia following cerebral infarction: Secondary | ICD-10-CM | POA: Diagnosis not present

## 2015-02-07 DIAGNOSIS — R4701 Aphasia: Secondary | ICD-10-CM | POA: Diagnosis not present

## 2015-02-07 DIAGNOSIS — R482 Apraxia: Secondary | ICD-10-CM | POA: Diagnosis not present

## 2015-02-07 DIAGNOSIS — G10 Huntington's disease: Secondary | ICD-10-CM | POA: Diagnosis not present

## 2015-02-08 DIAGNOSIS — G10 Huntington's disease: Secondary | ICD-10-CM | POA: Diagnosis not present

## 2015-02-08 DIAGNOSIS — N183 Chronic kidney disease, stage 3 (moderate): Secondary | ICD-10-CM | POA: Diagnosis not present

## 2015-02-08 DIAGNOSIS — R1312 Dysphagia, oropharyngeal phase: Secondary | ICD-10-CM | POA: Diagnosis not present

## 2015-02-08 DIAGNOSIS — M6281 Muscle weakness (generalized): Secondary | ICD-10-CM | POA: Diagnosis not present

## 2015-02-08 DIAGNOSIS — R482 Apraxia: Secondary | ICD-10-CM | POA: Diagnosis not present

## 2015-02-08 DIAGNOSIS — I69391 Dysphagia following cerebral infarction: Secondary | ICD-10-CM | POA: Diagnosis not present

## 2015-02-08 DIAGNOSIS — I69390 Apraxia following cerebral infarction: Secondary | ICD-10-CM | POA: Diagnosis not present

## 2015-02-08 DIAGNOSIS — R4701 Aphasia: Secondary | ICD-10-CM | POA: Diagnosis not present

## 2015-02-11 DIAGNOSIS — N183 Chronic kidney disease, stage 3 (moderate): Secondary | ICD-10-CM | POA: Diagnosis not present

## 2015-02-11 DIAGNOSIS — I69390 Apraxia following cerebral infarction: Secondary | ICD-10-CM | POA: Diagnosis not present

## 2015-02-11 DIAGNOSIS — R1312 Dysphagia, oropharyngeal phase: Secondary | ICD-10-CM | POA: Diagnosis not present

## 2015-02-11 DIAGNOSIS — M6281 Muscle weakness (generalized): Secondary | ICD-10-CM | POA: Diagnosis not present

## 2015-02-11 DIAGNOSIS — I69391 Dysphagia following cerebral infarction: Secondary | ICD-10-CM | POA: Diagnosis not present

## 2015-02-11 DIAGNOSIS — G10 Huntington's disease: Secondary | ICD-10-CM | POA: Diagnosis not present

## 2015-02-11 DIAGNOSIS — R482 Apraxia: Secondary | ICD-10-CM | POA: Diagnosis not present

## 2015-02-11 DIAGNOSIS — R4701 Aphasia: Secondary | ICD-10-CM | POA: Diagnosis not present

## 2015-02-15 DIAGNOSIS — R4701 Aphasia: Secondary | ICD-10-CM | POA: Diagnosis not present

## 2015-02-15 DIAGNOSIS — G10 Huntington's disease: Secondary | ICD-10-CM | POA: Diagnosis not present

## 2015-02-15 DIAGNOSIS — R1312 Dysphagia, oropharyngeal phase: Secondary | ICD-10-CM | POA: Diagnosis not present

## 2015-02-15 DIAGNOSIS — I69390 Apraxia following cerebral infarction: Secondary | ICD-10-CM | POA: Diagnosis not present

## 2015-02-15 DIAGNOSIS — I69391 Dysphagia following cerebral infarction: Secondary | ICD-10-CM | POA: Diagnosis not present

## 2015-02-15 DIAGNOSIS — M6281 Muscle weakness (generalized): Secondary | ICD-10-CM | POA: Diagnosis not present

## 2015-02-15 DIAGNOSIS — N183 Chronic kidney disease, stage 3 (moderate): Secondary | ICD-10-CM | POA: Diagnosis not present

## 2015-02-15 DIAGNOSIS — R482 Apraxia: Secondary | ICD-10-CM | POA: Diagnosis not present

## 2015-02-16 DIAGNOSIS — R4701 Aphasia: Secondary | ICD-10-CM | POA: Diagnosis not present

## 2015-02-16 DIAGNOSIS — R1312 Dysphagia, oropharyngeal phase: Secondary | ICD-10-CM | POA: Diagnosis not present

## 2015-02-16 DIAGNOSIS — G10 Huntington's disease: Secondary | ICD-10-CM | POA: Diagnosis not present

## 2015-02-16 DIAGNOSIS — M6281 Muscle weakness (generalized): Secondary | ICD-10-CM | POA: Diagnosis not present

## 2015-02-16 DIAGNOSIS — R482 Apraxia: Secondary | ICD-10-CM | POA: Diagnosis not present

## 2015-02-16 DIAGNOSIS — I69391 Dysphagia following cerebral infarction: Secondary | ICD-10-CM | POA: Diagnosis not present

## 2015-02-16 DIAGNOSIS — I69390 Apraxia following cerebral infarction: Secondary | ICD-10-CM | POA: Diagnosis not present

## 2015-02-16 DIAGNOSIS — N183 Chronic kidney disease, stage 3 (moderate): Secondary | ICD-10-CM | POA: Diagnosis not present

## 2015-02-18 DIAGNOSIS — I69390 Apraxia following cerebral infarction: Secondary | ICD-10-CM | POA: Diagnosis not present

## 2015-02-18 DIAGNOSIS — R4701 Aphasia: Secondary | ICD-10-CM | POA: Diagnosis not present

## 2015-02-18 DIAGNOSIS — I69391 Dysphagia following cerebral infarction: Secondary | ICD-10-CM | POA: Diagnosis not present

## 2015-02-18 DIAGNOSIS — M6281 Muscle weakness (generalized): Secondary | ICD-10-CM | POA: Diagnosis not present

## 2015-02-18 DIAGNOSIS — G10 Huntington's disease: Secondary | ICD-10-CM | POA: Diagnosis not present

## 2015-02-18 DIAGNOSIS — R1312 Dysphagia, oropharyngeal phase: Secondary | ICD-10-CM | POA: Diagnosis not present

## 2015-02-18 DIAGNOSIS — R482 Apraxia: Secondary | ICD-10-CM | POA: Diagnosis not present

## 2015-02-18 DIAGNOSIS — N183 Chronic kidney disease, stage 3 (moderate): Secondary | ICD-10-CM | POA: Diagnosis not present

## 2015-02-19 DIAGNOSIS — R1312 Dysphagia, oropharyngeal phase: Secondary | ICD-10-CM | POA: Diagnosis not present

## 2015-02-19 DIAGNOSIS — R4701 Aphasia: Secondary | ICD-10-CM | POA: Diagnosis not present

## 2015-02-19 DIAGNOSIS — R482 Apraxia: Secondary | ICD-10-CM | POA: Diagnosis not present

## 2015-02-19 DIAGNOSIS — I69391 Dysphagia following cerebral infarction: Secondary | ICD-10-CM | POA: Diagnosis not present

## 2015-02-19 DIAGNOSIS — M6281 Muscle weakness (generalized): Secondary | ICD-10-CM | POA: Diagnosis not present

## 2015-02-19 DIAGNOSIS — I69390 Apraxia following cerebral infarction: Secondary | ICD-10-CM | POA: Diagnosis not present

## 2015-02-19 DIAGNOSIS — G10 Huntington's disease: Secondary | ICD-10-CM | POA: Diagnosis not present

## 2015-02-19 DIAGNOSIS — N183 Chronic kidney disease, stage 3 (moderate): Secondary | ICD-10-CM | POA: Diagnosis not present

## 2015-02-20 DIAGNOSIS — R1312 Dysphagia, oropharyngeal phase: Secondary | ICD-10-CM | POA: Diagnosis not present

## 2015-02-20 DIAGNOSIS — R4701 Aphasia: Secondary | ICD-10-CM | POA: Diagnosis not present

## 2015-02-20 DIAGNOSIS — I69390 Apraxia following cerebral infarction: Secondary | ICD-10-CM | POA: Diagnosis not present

## 2015-02-20 DIAGNOSIS — M6281 Muscle weakness (generalized): Secondary | ICD-10-CM | POA: Diagnosis not present

## 2015-02-20 DIAGNOSIS — G10 Huntington's disease: Secondary | ICD-10-CM | POA: Diagnosis not present

## 2015-02-20 DIAGNOSIS — N183 Chronic kidney disease, stage 3 (moderate): Secondary | ICD-10-CM | POA: Diagnosis not present

## 2015-02-20 DIAGNOSIS — I69391 Dysphagia following cerebral infarction: Secondary | ICD-10-CM | POA: Diagnosis not present

## 2015-02-20 DIAGNOSIS — R482 Apraxia: Secondary | ICD-10-CM | POA: Diagnosis not present

## 2015-02-21 DIAGNOSIS — R4701 Aphasia: Secondary | ICD-10-CM | POA: Diagnosis not present

## 2015-02-21 DIAGNOSIS — N183 Chronic kidney disease, stage 3 (moderate): Secondary | ICD-10-CM | POA: Diagnosis not present

## 2015-02-21 DIAGNOSIS — R482 Apraxia: Secondary | ICD-10-CM | POA: Diagnosis not present

## 2015-02-21 DIAGNOSIS — G10 Huntington's disease: Secondary | ICD-10-CM | POA: Diagnosis not present

## 2015-02-21 DIAGNOSIS — M6281 Muscle weakness (generalized): Secondary | ICD-10-CM | POA: Diagnosis not present

## 2015-02-21 DIAGNOSIS — R1312 Dysphagia, oropharyngeal phase: Secondary | ICD-10-CM | POA: Diagnosis not present

## 2015-02-21 DIAGNOSIS — I69391 Dysphagia following cerebral infarction: Secondary | ICD-10-CM | POA: Diagnosis not present

## 2015-02-21 DIAGNOSIS — I69390 Apraxia following cerebral infarction: Secondary | ICD-10-CM | POA: Diagnosis not present

## 2015-03-01 DIAGNOSIS — F339 Major depressive disorder, recurrent, unspecified: Secondary | ICD-10-CM | POA: Diagnosis not present

## 2015-03-01 DIAGNOSIS — I82403 Acute embolism and thrombosis of unspecified deep veins of lower extremity, bilateral: Secondary | ICD-10-CM | POA: Diagnosis not present

## 2015-03-01 DIAGNOSIS — N183 Chronic kidney disease, stage 3 (moderate): Secondary | ICD-10-CM | POA: Diagnosis not present

## 2015-03-01 DIAGNOSIS — R4701 Aphasia: Secondary | ICD-10-CM | POA: Diagnosis not present

## 2015-03-01 DIAGNOSIS — I639 Cerebral infarction, unspecified: Secondary | ICD-10-CM | POA: Diagnosis not present

## 2015-03-14 DIAGNOSIS — M6281 Muscle weakness (generalized): Secondary | ICD-10-CM | POA: Diagnosis not present

## 2015-03-15 DIAGNOSIS — M6281 Muscle weakness (generalized): Secondary | ICD-10-CM | POA: Diagnosis not present

## 2015-03-18 DIAGNOSIS — M6281 Muscle weakness (generalized): Secondary | ICD-10-CM | POA: Diagnosis not present

## 2015-03-19 DIAGNOSIS — M6281 Muscle weakness (generalized): Secondary | ICD-10-CM | POA: Diagnosis not present

## 2015-03-20 DIAGNOSIS — M6281 Muscle weakness (generalized): Secondary | ICD-10-CM | POA: Diagnosis not present

## 2015-03-21 DIAGNOSIS — M6281 Muscle weakness (generalized): Secondary | ICD-10-CM | POA: Diagnosis not present

## 2015-03-22 DIAGNOSIS — E559 Vitamin D deficiency, unspecified: Secondary | ICD-10-CM | POA: Diagnosis not present

## 2015-03-23 DIAGNOSIS — M6281 Muscle weakness (generalized): Secondary | ICD-10-CM | POA: Diagnosis not present

## 2015-03-25 DIAGNOSIS — F339 Major depressive disorder, recurrent, unspecified: Secondary | ICD-10-CM | POA: Diagnosis not present

## 2015-03-25 DIAGNOSIS — M6281 Muscle weakness (generalized): Secondary | ICD-10-CM | POA: Diagnosis not present

## 2015-03-25 DIAGNOSIS — E559 Vitamin D deficiency, unspecified: Secondary | ICD-10-CM | POA: Diagnosis not present

## 2015-03-25 DIAGNOSIS — G10 Huntington's disease: Secondary | ICD-10-CM | POA: Diagnosis not present

## 2015-03-26 DIAGNOSIS — M6281 Muscle weakness (generalized): Secondary | ICD-10-CM | POA: Diagnosis not present

## 2015-03-27 DIAGNOSIS — M6281 Muscle weakness (generalized): Secondary | ICD-10-CM | POA: Diagnosis not present

## 2015-03-28 DIAGNOSIS — M6281 Muscle weakness (generalized): Secondary | ICD-10-CM | POA: Diagnosis not present

## 2015-03-29 DIAGNOSIS — M6281 Muscle weakness (generalized): Secondary | ICD-10-CM | POA: Diagnosis not present

## 2015-04-01 DIAGNOSIS — M6281 Muscle weakness (generalized): Secondary | ICD-10-CM | POA: Diagnosis not present

## 2015-04-02 DIAGNOSIS — M6281 Muscle weakness (generalized): Secondary | ICD-10-CM | POA: Diagnosis not present

## 2015-04-03 DIAGNOSIS — M6281 Muscle weakness (generalized): Secondary | ICD-10-CM | POA: Diagnosis not present

## 2015-04-04 DIAGNOSIS — M6281 Muscle weakness (generalized): Secondary | ICD-10-CM | POA: Diagnosis not present

## 2015-04-05 DIAGNOSIS — M6281 Muscle weakness (generalized): Secondary | ICD-10-CM | POA: Diagnosis not present

## 2015-04-08 DIAGNOSIS — M6281 Muscle weakness (generalized): Secondary | ICD-10-CM | POA: Diagnosis not present

## 2015-04-09 DIAGNOSIS — M6281 Muscle weakness (generalized): Secondary | ICD-10-CM | POA: Diagnosis not present

## 2015-04-10 DIAGNOSIS — M6281 Muscle weakness (generalized): Secondary | ICD-10-CM | POA: Diagnosis not present

## 2015-04-11 DIAGNOSIS — M6281 Muscle weakness (generalized): Secondary | ICD-10-CM | POA: Diagnosis not present

## 2015-04-12 DIAGNOSIS — M6281 Muscle weakness (generalized): Secondary | ICD-10-CM | POA: Diagnosis not present

## 2015-04-15 DIAGNOSIS — M6281 Muscle weakness (generalized): Secondary | ICD-10-CM | POA: Diagnosis not present

## 2015-04-16 DIAGNOSIS — M6281 Muscle weakness (generalized): Secondary | ICD-10-CM | POA: Diagnosis not present

## 2015-04-17 DIAGNOSIS — M6281 Muscle weakness (generalized): Secondary | ICD-10-CM | POA: Diagnosis not present

## 2016-10-14 IMAGING — DX DG CHEST 2V
2 series · 2 of 2 positions shown · non-contrast
Comparison: 04/08/2013

CLINICAL DATA: Stroke

EXAM:
CHEST  2 VIEW

[chest lat]
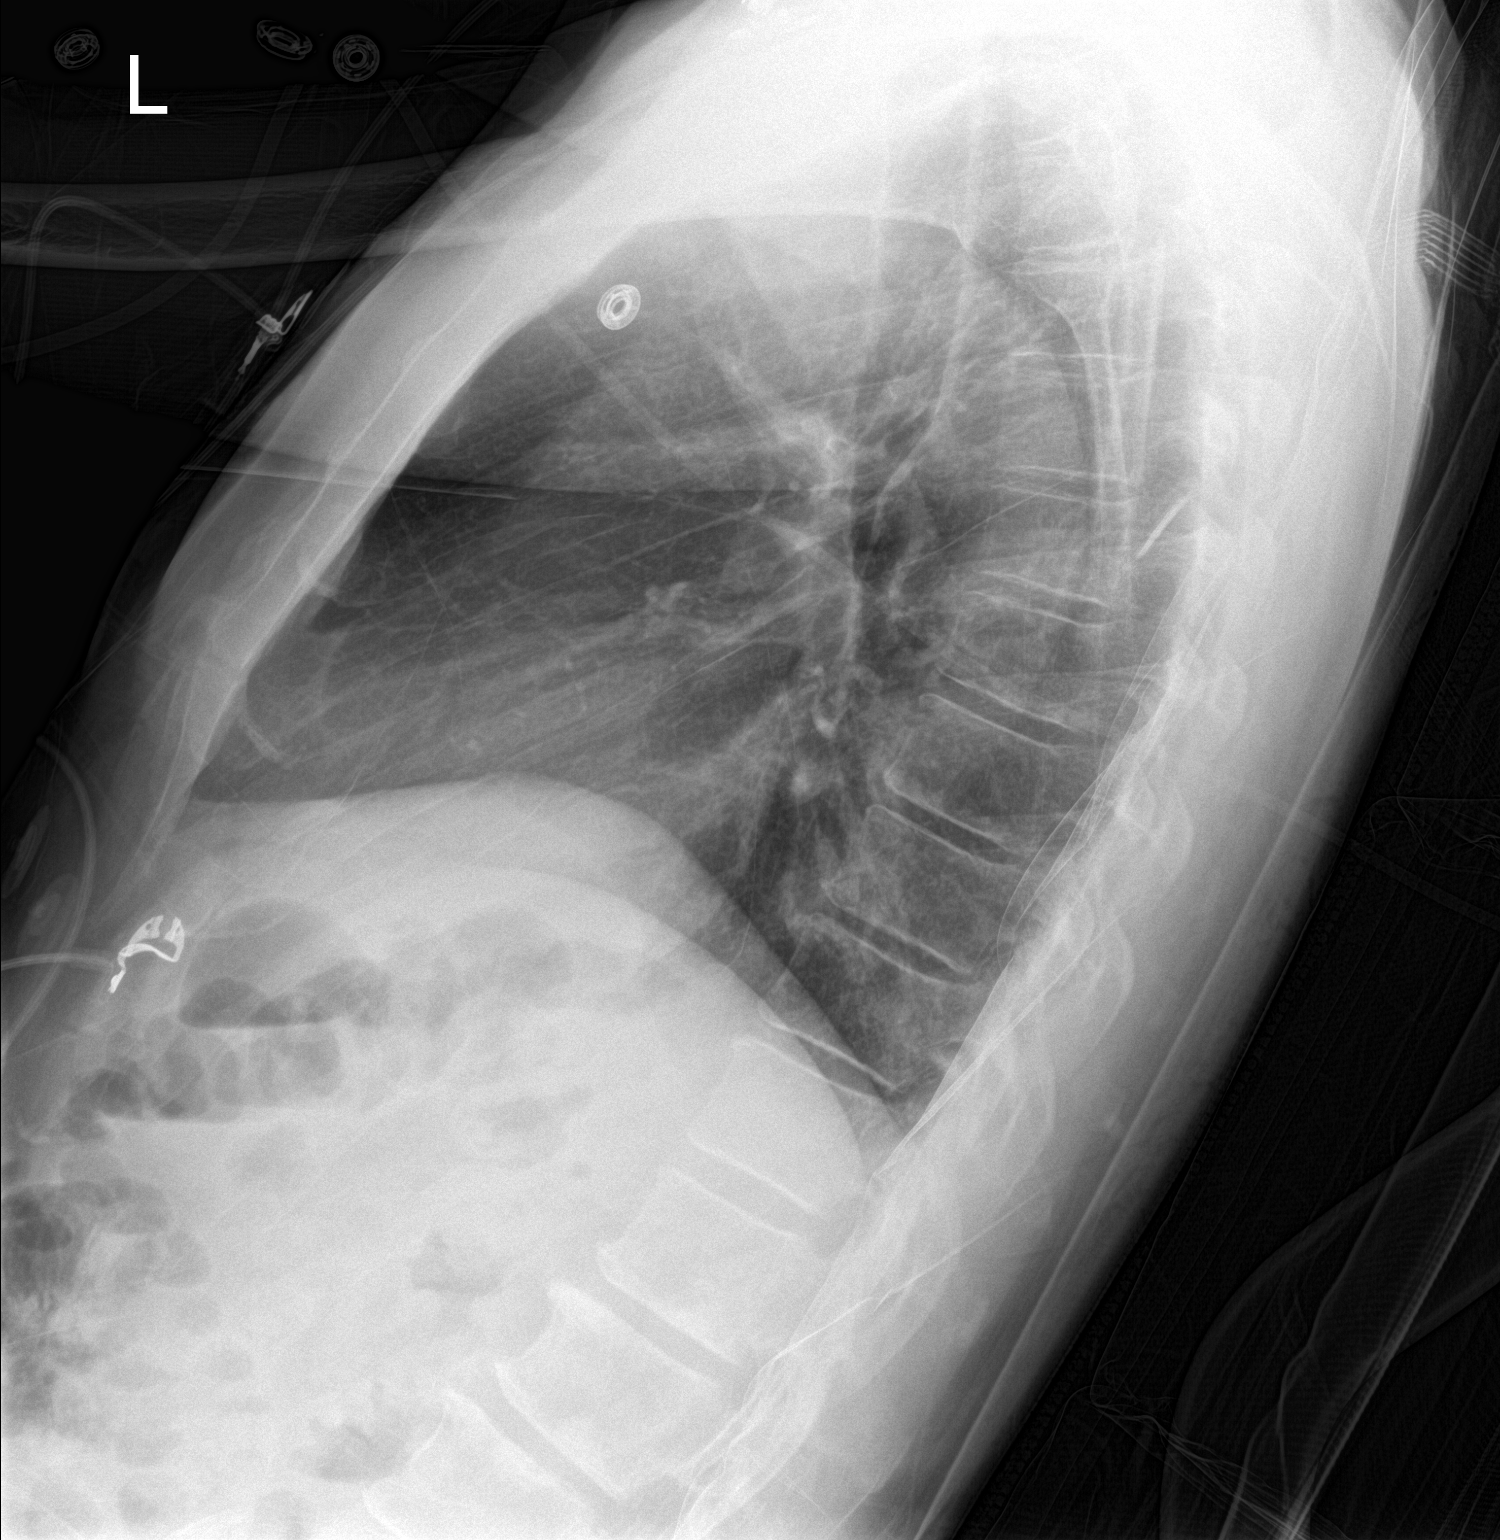

[chest ap]
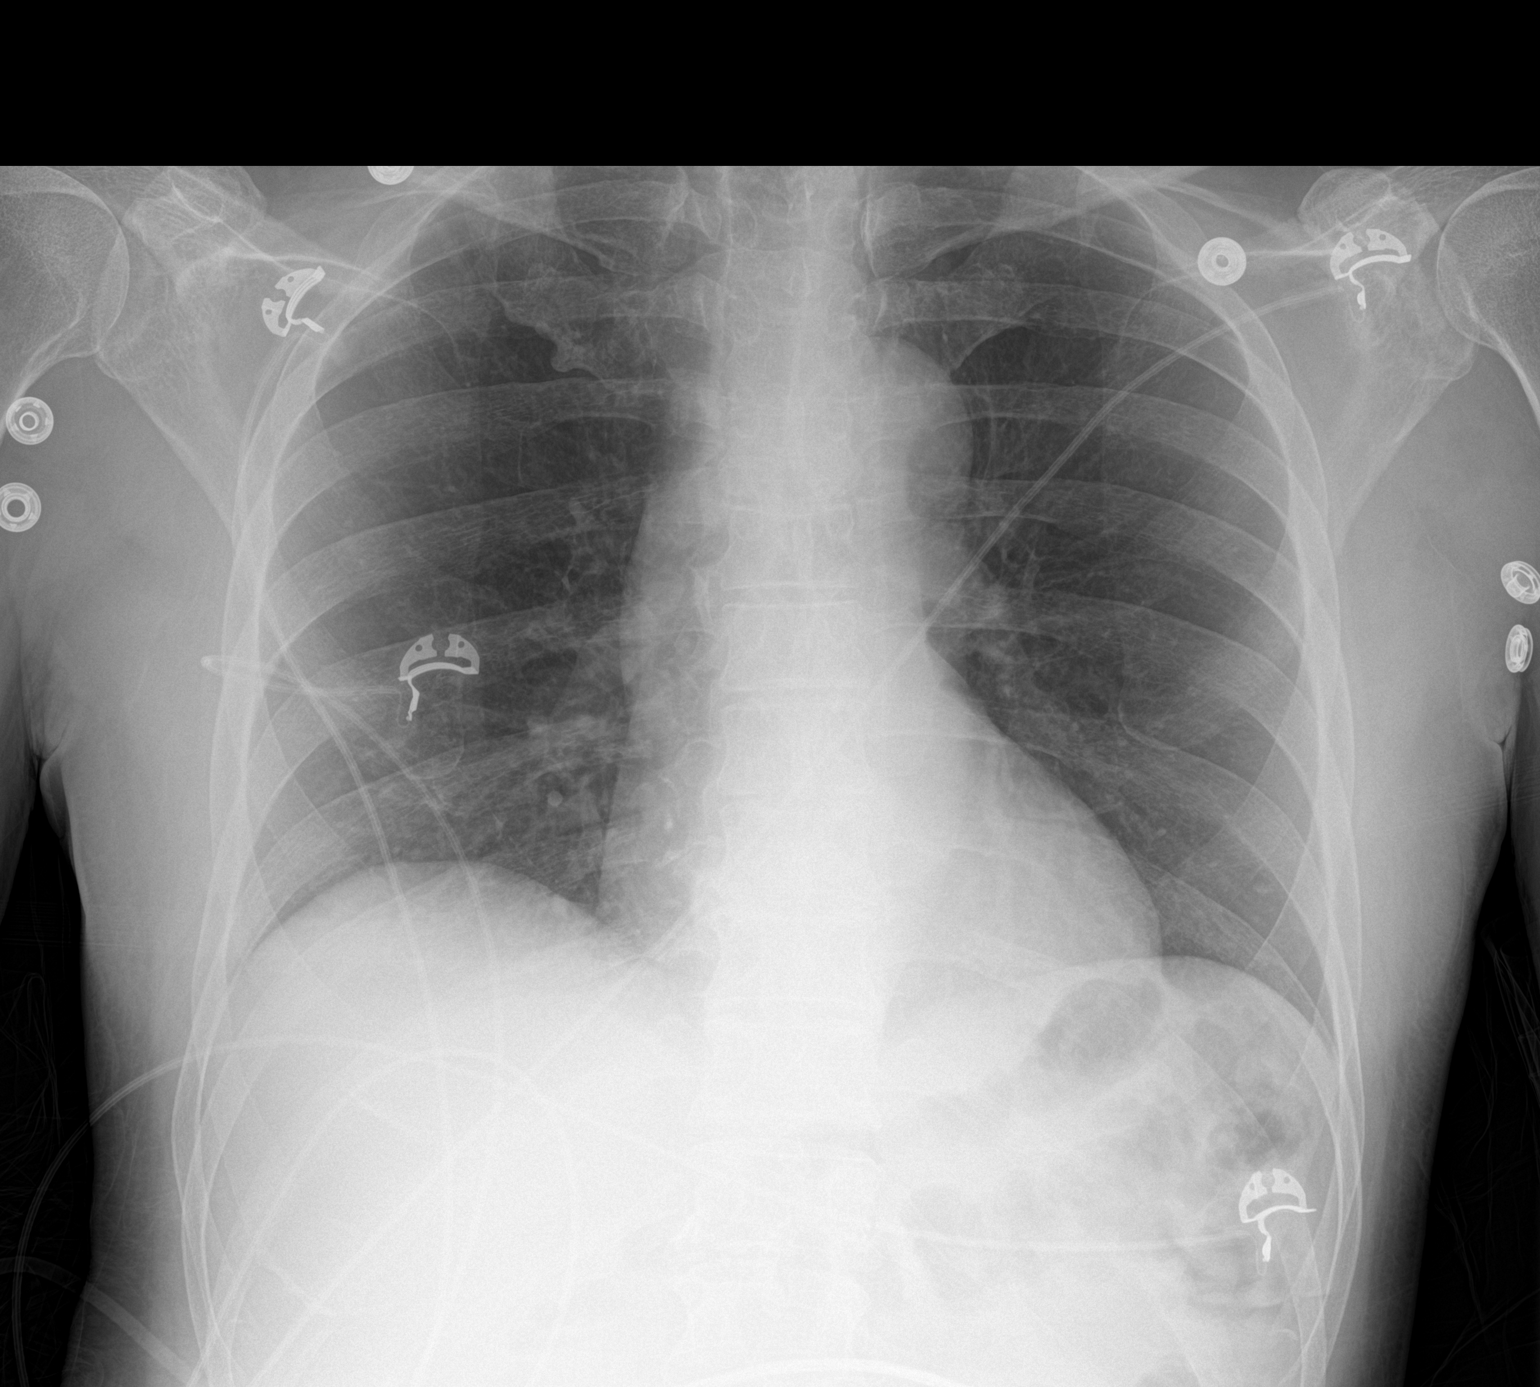

[2 of 2 positions shown; findings below may reference images not displayed]

FINDINGS: Normal heart size. Clear lungs. No pneumothorax. No pleural
effusion.
IMPRESSION: No active cardiopulmonary disease.

## 2016-10-14 IMAGING — CT CT HEAD W/O CM
1 series · 16 of 30 positions shown, 20 images · non-contrast
Comparison: None.

CLINICAL DATA: Right facial droop

EXAM:
CT HEAD WITHOUT CONTRAST
TECHNIQUE: Contiguous axial images were obtained from the base of the skull
through the vertex without intravenous contrast.

[Series 2: head 5.0 h30s · axial · 0.42mm/px · z∈[-120,+30]mm · 16 of 34 slices shown, 20 images]
[im 2/34  brain]
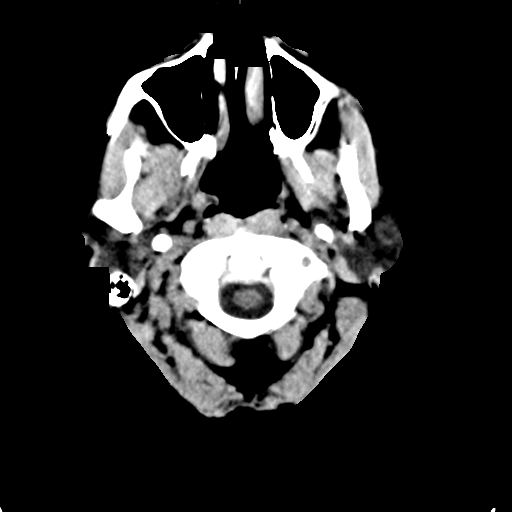
[im 2/34  bone]
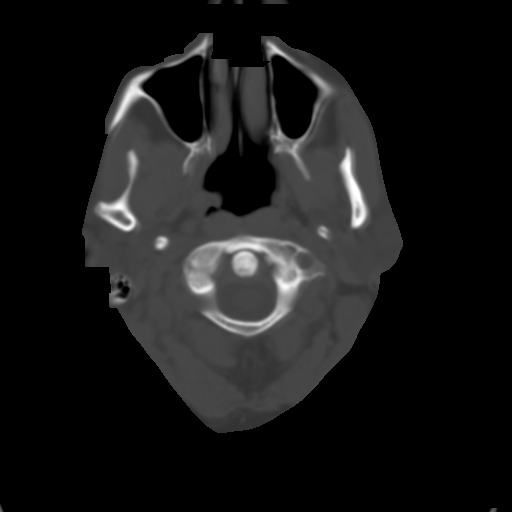
[im 4/34  brain]
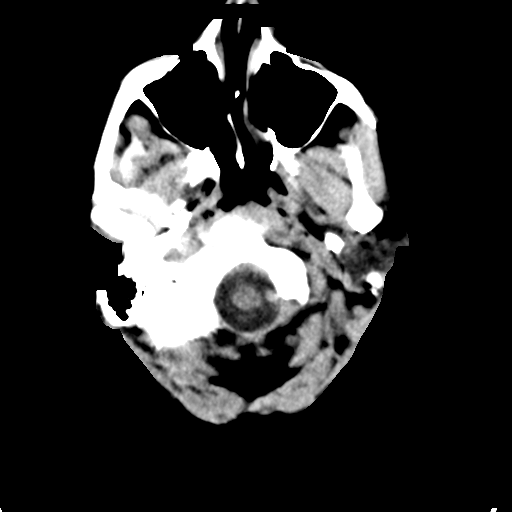
[im 6/34  brain]
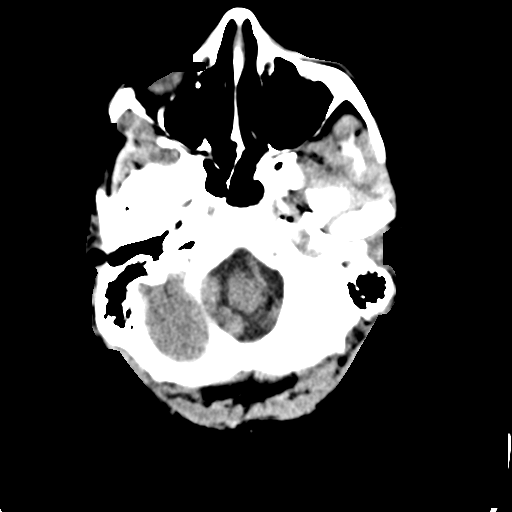
[im 8/34  brain]
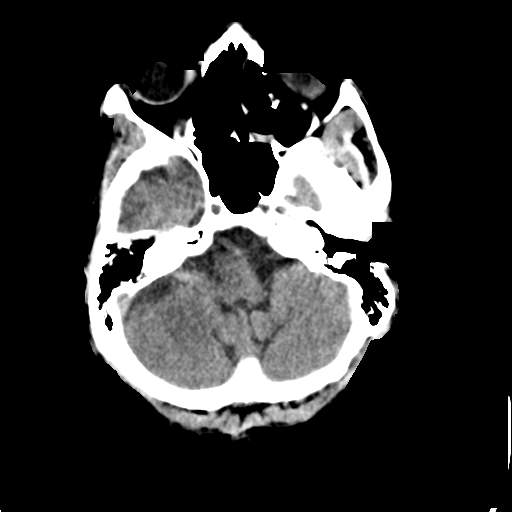
[im 10/34  brain]
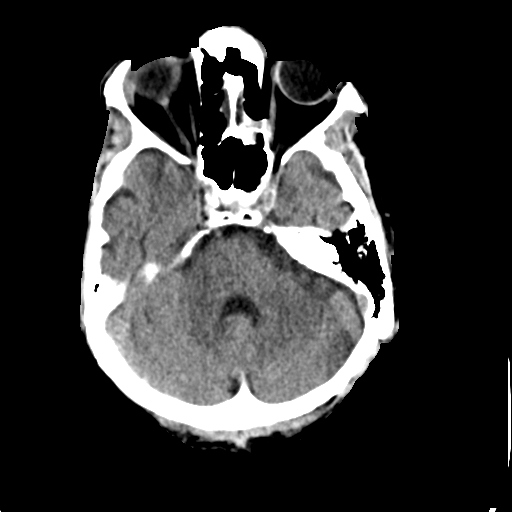
[im 10/34  bone]
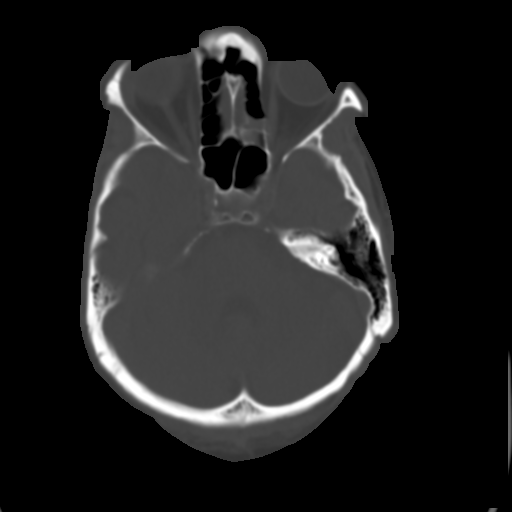
[im 12/34  brain]
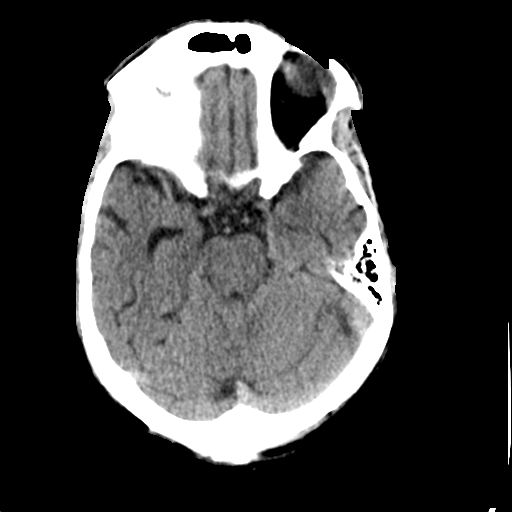
[im 14/34  brain]
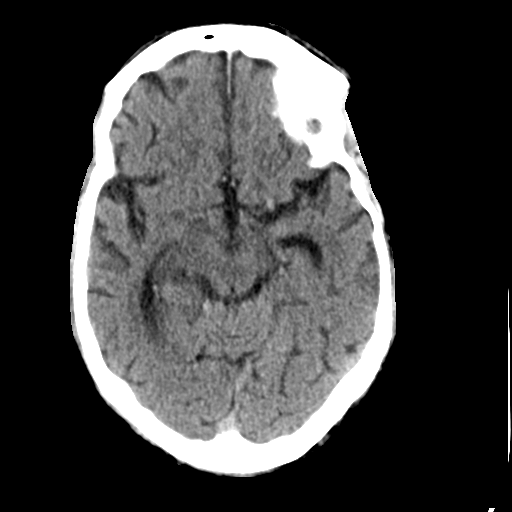
[im 16/34  brain]
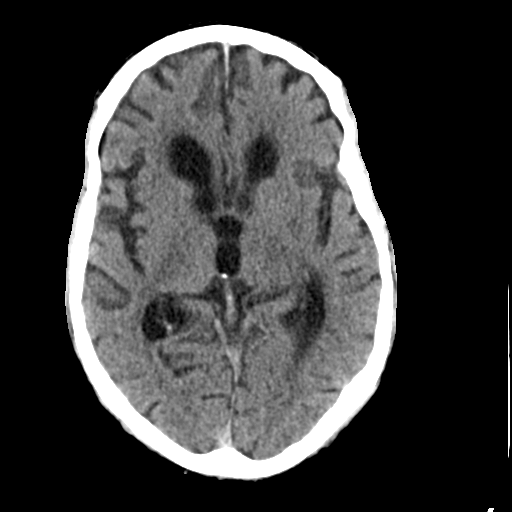
[im 18/34  brain]
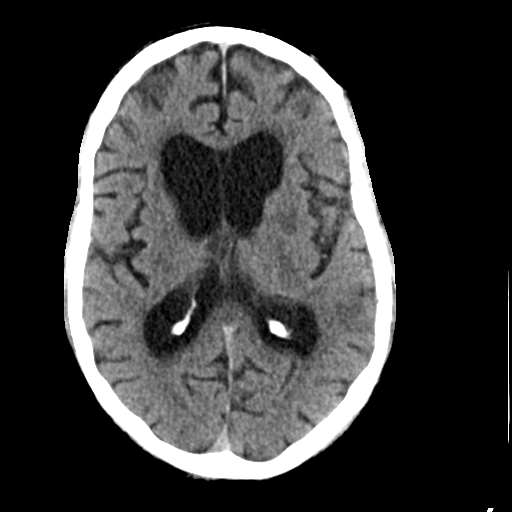
[im 18/34  bone]
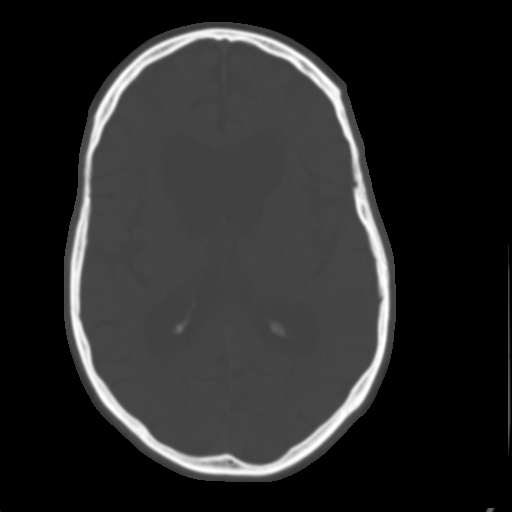
[im 20/34  brain]
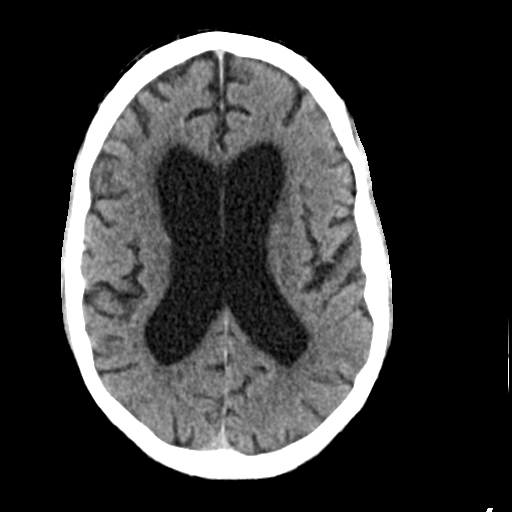
[im 22/34  brain]
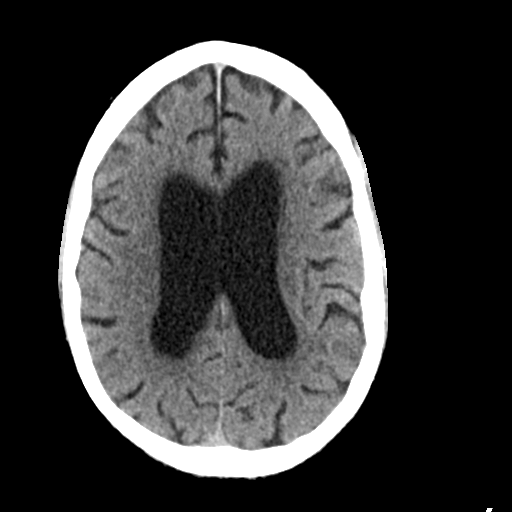
[im 24/34  brain]
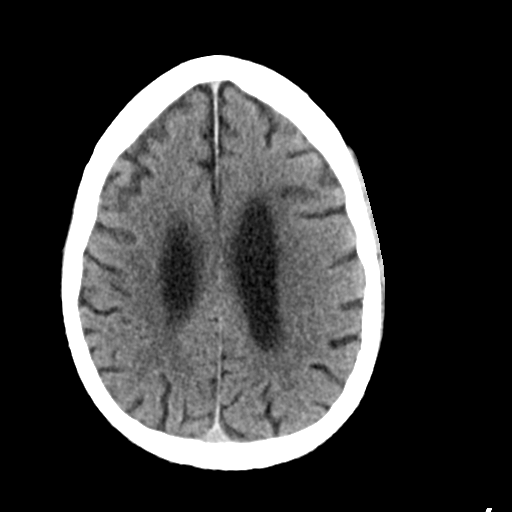
[im 26/34  brain]
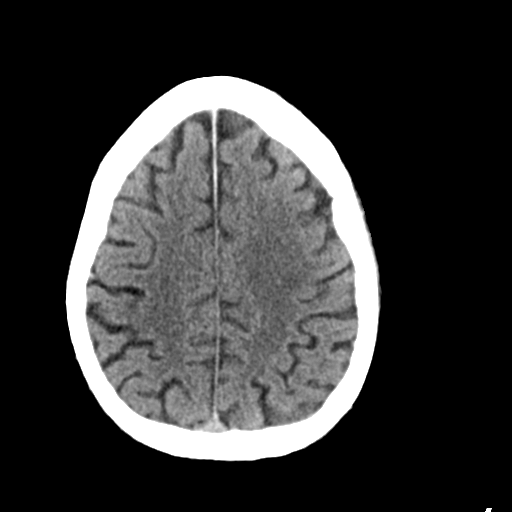
[im 26/34  bone]
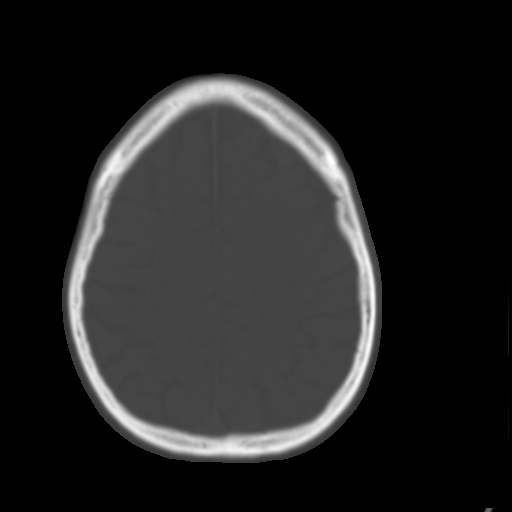
[im 28/34  brain]
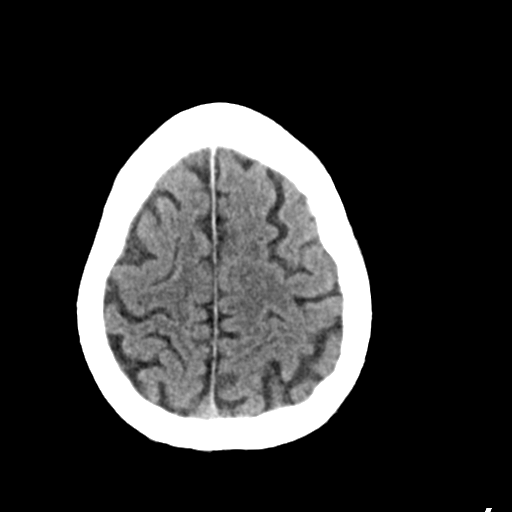
[im 30/34  brain]
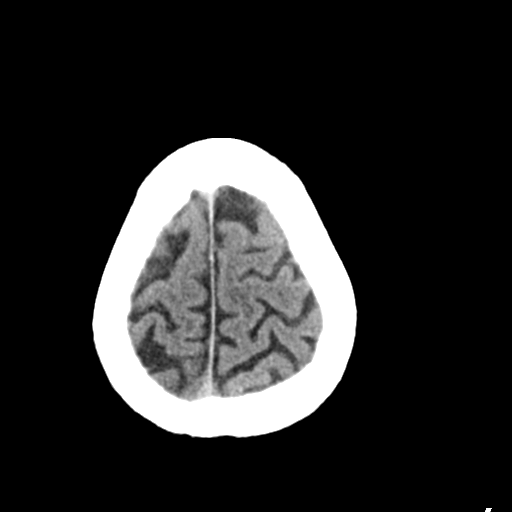
[im 32/34  brain]
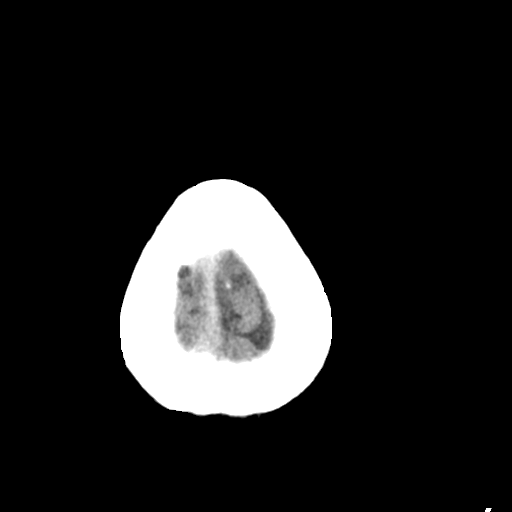

[16 of 30 positions shown; findings below may reference images not displayed]

FINDINGS: Global atrophy. Extensive chronic ischemic changes in the
periventricular white matter and basal ganglia. No mass effect,
midline shift, or acute intracranial hemorrhage. No evidence of
hyperdense MCA. Partial opacification of left posterior ethmoid air
cells.
IMPRESSION: No acute intracranial pathology.  Chronic changes are noted.

## 2017-07-01 ENCOUNTER — Other Ambulatory Visit (HOSPITAL_COMMUNITY): Payer: Self-pay | Admitting: Internal Medicine

## 2017-07-01 DIAGNOSIS — R1310 Dysphagia, unspecified: Secondary | ICD-10-CM

## 2017-07-07 ENCOUNTER — Ambulatory Visit (HOSPITAL_COMMUNITY)
Admission: RE | Admit: 2017-07-07 | Discharge: 2017-07-07 | Disposition: A | Discharge status: Home / Self Care | Payer: Medicare Other | Source: Ambulatory Visit | Attending: Internal Medicine | Admitting: Internal Medicine

## 2017-07-07 DIAGNOSIS — R1310 Dysphagia, unspecified: Secondary | ICD-10-CM

## 2017-07-07 DIAGNOSIS — R1313 Dysphagia, pharyngeal phase: Secondary | ICD-10-CM | POA: Diagnosis present

## 2017-07-07 NOTE — Progress Notes (Signed)
Modified Barium Swallow Progress Note  Patient Details  Name: Eric Waters MRN: [id number] Date of Birth: Apr 16, 1951  Today's Date: 07/07/2017  Modified Barium Swallow completed.  Full report located under Chart Review in the Imaging Section.  Brief recommendations include the following:  Clinical Impression  Maintaining a consistent view of pt's laryngopharyngeal anatomy was difficult due to pt's frequent movements with Huntington Chorea. He exhibited mild-moderate pharyngeal dysphagia due to motor impairments primarily decreased laryngeal closure resulting in silent penetration during the swallow with thin. Cues for cough and throat clear were ineffecitve to completely clear laryngeal vestibule. Swallow initiation was timely majority of the study. Moderate-max pyriform sinus residue and mild vallecular which he intermittently sensed and swallowed a second time to clear majority of residue. Quick observation/scan of esophagus was unremarkable (MBS does not diagnose below level of UES). SLP at nursing facility can consider continuing pt on thin liquids given he has not had pna or other respiratory compromise. If coughing during meals worsens or pt becomes ill and deconditioned with decreased reserve then would recommend thickening liquids to nectar consistency during those times. Recommend chopped solids, ask pt to cough/clear throat frequently during meals. Discussed pt's fast rate of self feeding (likely pt's attempts to prevent spillage with his chorea) and advised him to decrease rate, OT for weighted spoon if beneficial and full supervision; cup sips preferred and pills whole in puree.       Swallow Evaluation Recommendations       SLP Diet Recommendations: Dysphagia 2 (Fine chop) solids;Thin liquid   Liquid Administration via: Cup;No straw   Medication Administration: Whole meds with puree   Supervision: Patient able to self feed;Staff to assist with self feeding   Compensations:  Minimize environmental distractions;Slow rate;Small sips/bites;Multiple dry swallows after each bite/sip;Clear throat intermittently   Postural Changes: Remain semi-upright after after feeds/meals (Comment)   Oral Care Recommendations: Oral care BID        Eric Waters, Eric Waters 07/07/2017,5:20 PM  Breck CoonsLisa Waters LarnedLitaker M.Ed [employer] Pager [phone]

## 2017-09-15 ENCOUNTER — Encounter: Payer: Self-pay | Admitting: Neurology

## 2017-10-05 NOTE — Progress Notes (Deleted)
Eric Waters was seen today in neurologic consultation at the request of Eric Carol, MD.  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  Patient was previously cared for at [facility], but has not been seen here there since early 2015.  I have reviewed limited records that are available to me.  Patient was diagnosed with Huntington's disease in ***.  He has been treated with Prozac, 40 mg daily and Haldol 1 mg daily.  Pt currently living at [name].  Was living independently when being treated at [facility].    The patient is a 66 y.o. year old male with a history of ***.  The first symptom began *** years ago.   Neuroimaging has *** previously been performed.  It *** available for my review today.  PREVIOUS MEDICATIONS: ***  ALLERGIES:   Allergies  Allergen Reactions  . Floxin [Ofloxacin]   . Haldol [Haloperidol Lactate]   . Codeine Hives and Rash    CURRENT MEDICATIONS:  Outpatient Encounter Medications as of 10/07/2017  Medication Sig  . aspirin EC 325 MG tablet Take 1 tablet (325 mg total) by mouth daily.  Marland Kitchen atorvastatin (LIPITOR) 20 MG tablet Take 1 tablet (20 mg total) by mouth daily at 6 PM.  . FLUoxetine (PROZAC) 20 MG tablet Take 20 mg by mouth daily.  . haloperidol (HALDOL) 1 MG tablet Take 1 tablet (1 mg total) by mouth 2 (two) times daily.   No facility-administered encounter medications on file as of 10/07/2017.     PAST MEDICAL HISTORY:   Past Medical History:  Diagnosis Date  . Allergy   . Chronic diarrhea   . GERD (gastroesophageal reflux disease)   . Huntington chorea    previously followed at [facility]- Dr Eric Waters [phone] Altha Harm)    PAST SURGICAL HISTORY:   Past Surgical History:  Procedure Laterality Date  . TONSILLECTOMY  1959    SOCIAL HISTORY:   Social History   Socioeconomic History  . Marital status: Single    Spouse name: Not on file  . Number of children: Not on file  . Years of education: Not on file  .  Highest education level: Not on file  Social Needs  . Financial resource strain: Not on file  . Food insecurity - worry: Not on file  . Food insecurity - inability: Not on file  . Transportation needs - medical: Not on file  . Transportation needs - non-medical: Not on file  Occupational History  . Not on file  Tobacco Use  . Smoking status: Never Smoker  . Smokeless tobacco: Never Used  . Tobacco comment: Single, separated from wife 2003. Lives with friend of family. Moved to [place] area from [place] spring 2010 to be closer to [facility] and caregiver  Substance and Sexual Activity  . Alcohol use: No  . Drug use: No  . Sexual activity: Not on file  Other Topics Concern  . Not on file  Social History Narrative   Pt is separated from wife since 2003, moved from [place] spring 2010 to be closer to [facility] and caregivers. Lives with a friend of the family. Pt does not get regular exercise.    FAMILY HISTORY:   Family Status  Relation Name Status  . Father  Deceased  . MGM  (Not Specified)       Hx Huntington chorea  . Mother  Alive  . Sister ##Sister1 Alive  . Brother ##Brother1 Alive    ROS:  A complete 10 system  review of systems was obtained and was unremarkable apart from what is mentioned above.  PHYSICAL EXAMINATION:    VITALS:  There were no vitals filed for this visit.  GEN:  Normal appears male in no acute distress.  Appears stated age. HEENT:  Normocephalic, atraumatic. The mucous membranes are moist. The superficial temporal arteries are without ropiness or tenderness. Cardiovascular: Regular rate and rhythm. Lungs: Clear to auscultation bilaterally. Neck/Heme: There are no carotid bruits noted bilaterally.  NEUROLOGICAL: Orientation:  The patient is alert and oriented x 3.  Fund of knowledge is appropriate.  Recent and remote memory intact.  Attention span and concentration normal.  Repeats and names without difficulty. Cranial nerves: There is good facial symmetry. The pupils are  equal round and reactive to light bilaterally. Fundoscopic exam reveals clear disc margins bilaterally. Extraocular muscles are intact and visual fields are full to confrontational testing. Speech is fluent and clear. Soft palate rises symmetrically and there is no tongue deviation. Hearing is intact to conversational tone. Tone: Tone is good throughout. Sensation: Sensation is intact to light touch and pinprick throughout (facial, trunk, extremities). Vibration is intact at the bilateral big toe. There is no extinction with double simultaneous stimulation. There is no sensory dermatomal level identified. Coordination:  The patient has no difficulty with RAM's or FNF bilaterally. Motor: Strength is 5/5 in the bilateral upper and lower extremities.  Shoulder shrug is equal and symmetric. There is no pronator drift.  There are no fasciculations noted. DTR's: Deep tendon reflexes are 2/4 at the bilateral biceps, triceps, brachioradialis, patella and achilles.  Plantar responses are downgoing bilaterally. Gait and Station: The patient is able to ambulate without difficulty. The patient is able to heel toe walk without any difficulty. The patient is able to ambulate in a tandem fashion. The patient is able to stand in the Romberg position.   IMPRESSION/PLAN  1. ***Huntington's disease  -Long discussion with the patient regarding diagnosis and pathophysiology.  Discussed neurodegenerative nature.  . Talked about the implications for family, particularly his children.  Talked about the fact that this is an autosomal dominant disease.  Talked about the fact that over the long-term, this can involve more than just abnormal movements.  This can involve mood and cognitive changes.  This can also affect swallowing and ability to ambulate.  This can also affect speech.  Talked about the fact that medications specifically for HD often only help the chorea.  Discussed the fact that depression and even psychosis can be  significant with this disease.  He admits to depression, but states that this is well controlled.  Talked about resources in the area for Huntington's patients. Discussed community resources and support groups, including HD REACH.  Met with our Education officer, museum today    Cc:  Eric Carol, MD

## 2017-10-07 ENCOUNTER — Ambulatory Visit: Payer: Self-pay | Admitting: Neurology

## 2017-10-22 NOTE — Progress Notes (Signed)
Eric Waters was seen today in neurologic consultation at the request of Eric Waters, Ronald, MD.  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  Patient was previously [facility] for at [facility], but has not been seen here there since early 2015.  I have reviewed records available. Patient presents with husband of his first cousin, who supplements the history.  Patient was diagnosed with Huntington's disease about 25 years ago.  His mother, maternal GM and "several other relatives" have HD.  Pt has 4 biological children and 1 adopted child.  As far as family present knows, the children have not been tested.  Two of his children have children themselves.    He has been treated with Prozac, 40 mg daily and Haldol 1 mg daily.  His prozac has been reduced now to 5 mg daily.  Patient denies depression and his family member present states that patient has never been depressed.  Pt lived with this family member for year.   His haldol is now at 0.5 mg bid.  Family doesn't think that it every really helped the chorea.   Pt currently living at [facility].  Was living with family when being treated at [facility].  Family states that he had significant chorea until stroke in 2015 and then chorea stopped.  In the last 6-8 months, chorea has returned and has increased, so much so that he has fallen out of the wheelchair.  Family gets call virtually every day or every other day that he slips out of the Eric Waters from movement.  They don't recall any other medication being used for movement.   Family has purchased 3 lazy boy recliners and he has rocked the screws out of all of them.  At SNF, he is normally in a WC and he doesn't need to be strapped in or have a tray in front of him.    The patient had a modified barium swallow in August, 2018.  I reviewed this.  There is evidence of mild to moderate pharyngeal dysphagia.  A dysphagia 2 diet was recommended (fine CHOP solid with thin liquid, no straw).   Neuroimaging has   previously been performed.  It is available for my review today.  [facility] after [facility] with confusion and facial droop.  This demonstrated acute infarcts in the left MCA territory.  He was placed on aspirin and statin was recommended once he was able to swallow.  He is on lipitor now.  I have reviewed hospital records from that 2015 hospitalization, and reviewed the MRI.   ALLERGIES:   Allergies  Allergen Reactions  . Floxin [Ofloxacin]   . Codeine Hives and Rash    CURRENT MEDICATIONS:  Outpatient Encounter Medications as of 10/25/2017  Medication Sig  . acetaminophen (TYLENOL) 325 MG tablet Take 650 mg by mouth every 6 (six) hours as needed.  Marland Kitchen. apixaban (ELIQUIS) 5 MG TABS tablet Take 5 mg by mouth 2 (two) times daily.  Marland Kitchen. aspirin EC 81 MG tablet Take 81 mg by mouth daily.  Marland Kitchen. atorvastatin (LIPITOR) 20 MG tablet Take 1 tablet (20 mg total) by mouth daily at 6 PM.  . cholecalciferol (VITAMIN D) 1000 units tablet Take 1,000 Units by mouth daily.  Marland Kitchen. FLUoxetine (PROZAC) 10 MG tablet Take 5 mg by mouth daily.  Marland Kitchen. lisinopril (PRINIVIL,ZESTRIL) 2.5 MG tablet Take 2.5 mg by mouth daily.  . [DISCONTINUED] haloperidol (HALDOL) 2 MG/ML solution Take 5 mg by mouth  2 (two) times daily.  Marland Kitchen. tetrabenazine (XENAZINE) 12.5 MG tablet 1 tablet daily in the morning for 1 week, 1 tablet in the morning, 1 tablet in the evening for 1 week 1 tablet in the morning, 1 tablet in the afternoon, 1 tablet in the evening for 1 week 2 tablets in the morning, 1 tablet in afternoon, 1 tablet in the evening for 1 week 2 tablets in the morning, 1 tablet in afternoon, 2 tablets in the evening for 1 week 2 tablets in the morning, 2 tablets in afternoon, 2 tablets in the evening for 1 week  . tetrabenazine (XENAZINE) 25 MG tablet Take 1 tablet (25 mg total) by mouth 3 (three) times daily.  . [DISCONTINUED] aspirin EC 325 MG tablet Take 1 tablet (325 mg total) by mouth daily.  .  [DISCONTINUED] FLUoxetine (PROZAC) 20 MG tablet Take 20 mg by mouth daily.  . [DISCONTINUED] haloperidol (HALDOL) 1 MG tablet Take 1 tablet (1 mg total) by mouth 2 (two) times daily.   No facility-administered encounter medications on file as of 10/25/2017.     PAST MEDICAL HISTORY:   Past Medical History:  Diagnosis Date  . Allergy   . Chronic kidney disease   . CVA (cerebral vascular accident) (Eric Waters)   . Depression   . Dysphagia   . GERD (gastroesophageal reflux disease)   . Huntington chorea [facility](Eric Waters)    [facility] [phone] Eric Canes(Christine)  . Hyperlipidemia   . Hypertension   . Vitamin D deficiency     PAST SURGICAL HISTORY:   Past Surgical History:  Procedure Laterality Date  . TONSILLECTOMY  1959    SOCIAL HISTORY:   Social History   Socioeconomic History  . Marital status: Single    Spouse name: Not on file  . Number of children: Not on file  . Years of education: Not on file  . Highest education level: Not on file  Social Needs  . Financial resource strain: Not on file  . Food insecurity - worry: Not on file  . Food insecurity - inability: Not on file  . Transportation needs - medical: Not on file  . Transportation needs - non-medical: Not on file  Occupational History  . Not on file  Tobacco Use  . Smoking status: Never Smoker  . Smokeless tobacco: Never Used  . Tobacco comment: Single, separated from wife 2003. Lives with friend of family. Moved to [place] area from [place] spring 2010 to [facility] and [facility]  Substance and Sexual Activity  . Alcohol use: No  . Drug use: No  . Sexual activity: Not on file  Other Topics Concern  . Not on file  Social History Narrative   Pt is separated from wife since 2003, moved from [place] spring 2010 [facility] and [facility]. Lives with a friend of the family. Pt does not get regular exercise.    FAMILY HISTORY:   Family Status  Relation Name Status  . Father  Deceased  . MGM   (Not Specified)       Hx Huntington chorea  . Mother  Alive  . Sister  Alive  . Brother  Alive  . Mat Uncle  (Not Specified)  . MGF  (Not Specified)    ROS:  Unable to obtain most of ROS due to patient MS.  Does deny CP/SOB.    PHYSICAL EXAMINATION:    VITALS:   Vitals:   10/25/17 1014  BP:  120/70  Pulse: 62  SpO2: 93%    GEN:  Normal appears male in no acute distress.  Appears stated age.  Has some trouble following commands. HEENT:  Normocephalic, atraumatic. The mucous membranes are moist. The superficial temporal arteries are without ropiness or tenderness. Cardiovascular: Regular rate and rhythm. Lungs: Clear to auscultation bilaterally. Neck/Heme: There are no carotid bruits noted bilaterally.  NEUROLOGICAL: Orientation:  The patient is alert.  Doesn't answer orientation questions but does answer some questions about his children.  He is able to tell me that "Cindee Lame" is his oldest child.  He cannot tell me the age of that son. Cranial nerves: There is good facial symmetry. The pupils are equal round and reactive to light bilaterally. Fundoscopic exam is attempted but the disc margins are not well visualized bilaterally.  He tracks appropriately about the room.  His speech is fluent, but quite dysarthric.  His speech is bulbar.  Soft palate rises symmetrically and there is no tongue deviation. Hearing appears to be intact to conversational tone. Tone: There may be increased tone in the right upper extremity, but he resists testing. Sensation: Patient has difficulty participating with sensitive aspects of the sensory testing. Coordination:  The patient is able to do hand opening and closing on the left without any difficulty.  He is unable to do that on the right hand.  He is very apraxic when asked to do finger-nose-finger on the left and keeps trying to touch the examiner's nose. Motor: The R hand is held in a fist with the thumb between the pointer and middle finger.  Strength  in the right arm and right leg are at least antigravity.  He has trouble participating with manual motor testing, particularly in the right arm.  Grip strength on the left is very good.  Likewise, strength in the left arm and left leg are at least 5-/5 and likely 5/5. DTR's: Deferred Gait and Station: The patient is unable to ambulate any longer.  He is on a stretcher. Abnormal movements: There is rhythmic tremor of the right hand that is intermittent.  It is mild.  There is chorea of the left arm and more mild chorea of the left leg.  I did not see it on the right.  During the examination, he was sliding down on the stretcher because of chorea, despite the fact that he was belted in.   IMPRESSION/PLAN  1. Huntington's disease  -Long discussion with the patient and family regarding diagnosis and pathophysiology.  Discussed neurodegenerative nature.  . Talked about the implications for family, particularly his children.  Talked about the fact that this is an autosomal dominant disease. He and his family are well aware of all of this.  It doesn't sound like he interacts with his children much and his cousin is primary family contact.  Talked about the fact that over the long-term, this can involve more than just abnormal movements.  This can involve mood and cognitive changes.  He has certainly had cognitive change.  Fortunately, his mood has remained quite good.    -His disease has certainly affected swallowing, and  the patient had a modified barium swallow in August, 2018.   There is evidence of mild to moderate pharyngeal dysphagia.  A dysphagia 2 diet was recommended (fine CHOP solid with thin liquid, no straw).this can also affect swallowing and ability to ambulate.    - Talked about the fact that medications specifically for HD often only help the chorea.  Discussed  the fact that depression and even psychosis can be significant with this disease.  Fortunately, patient and family deny depression.  pt  is currently on haldol and has been on this for a long time.  There are other agents out specifically for Huntington's chorea, and patient and family would like to try tetrabenazine.  We talked about deuterated tetrabenazine as well (Austedo).  I think that it will probably be easier for him to get regular tetrabenazine.   I talked to the patient and family extensively about the risks, benefits, and side effects.  They will let me know if he has any depression or any other side effects.  We talked about the titration and checking CYP 2 D6 if necessary, if lower dosages do not help.  Will d/c haldol.  2.  History of cerebral infarction in 2015  -Talked about stroke risk factors  -Talked about importance of blood pressure control with a goal <130/80 mm Hg.   -Talked about importance of lipid control and proper diet.  Lipids should be managed intensively, with a goal LDL < 70 mg/dL.  On statin (lipitor)  -on eliquis  3.  Follow up is anticipated in the next few months, sooner should new neurologic issues arise.    Complexity of visit:  high     Cc:  Eric Dills, MD

## 2017-10-25 ENCOUNTER — Encounter: Payer: Self-pay | Admitting: Neurology

## 2017-10-25 ENCOUNTER — Ambulatory Visit (INDEPENDENT_AMBULATORY_CARE_PROVIDER_SITE_OTHER): Payer: Medicare Other | Admitting: Neurology

## 2017-10-25 VITALS — BP 120/70 | HR 62

## 2017-10-25 DIAGNOSIS — Z8673 Personal history of transient ischemic attack (TIA), and cerebral infarction without residual deficits: Secondary | ICD-10-CM

## 2017-10-25 DIAGNOSIS — R1319 Other dysphagia: Secondary | ICD-10-CM

## 2017-10-25 DIAGNOSIS — G10 Huntington's disease: Secondary | ICD-10-CM

## 2017-10-25 MED ORDER — TETRABENAZINE 25 MG PO TABS: 25.0000 mg | ORAL_TABLET | Freq: Three times a day (TID) | ORAL | 2 refills | Status: DC

## 2017-10-25 MED ORDER — TETRABENAZINE 12.5 MG PO TABS: ORAL_TABLET | ORAL | 0 refills | Status: DC

## 2017-11-04 ENCOUNTER — Telehealth: Payer: Self-pay | Admitting: Neurology

## 2017-11-04 MED ORDER — HALOPERIDOL LACTATE 2 MG/ML PO CONC: 5.0000 mg | Freq: Two times a day (BID) | ORAL | 0 refills | Status: DC

## 2017-11-04 NOTE — Telephone Encounter (Signed)
My intention was not to d/c the haldol until we started xenazine.  Restart haldol until xenazine available.

## 2017-11-04 NOTE — Telephone Encounter (Signed)
[facility]. Order for Haldol faxed to them at [phone].

## 2017-11-04 NOTE — Telephone Encounter (Signed)
Called back but Eric JoinerRebecca unavailable. Will try again later.

## 2017-11-04 NOTE — Telephone Encounter (Signed)
Spoke with Lurena Joinerebecca, RN, at [facility]. The pharmacy is having a hard time getting Xenazine medication. His haldol has been d/c and he is not on anything at the moment. He is having a lot of movements that are causing him to fall out of his chair a lot. Please advise what they can do in the interim. They did not have any idea when [place] would be available.

## 2017-11-04 NOTE — Telephone Encounter (Signed)
Eric Waters with [facility] called regarding pt's prescription for  tetrabenazana CB# [phone]

## 2017-11-05 MED ORDER — TETRABENAZINE 25 MG PO TABS: 25.0000 mg | ORAL_TABLET | Freq: Three times a day (TID) | ORAL | 2 refills | Status: DC

## 2017-11-05 MED ORDER — TETRABENAZINE 12.5 MG PO TABS: ORAL_TABLET | ORAL | 0 refills | Status: DC

## 2017-11-05 NOTE — Telephone Encounter (Signed)
Eric Waters ScarletXenazine RX sent to specialty pharmacy.

## 2017-11-05 NOTE — Telephone Encounter (Signed)
Spoke with Vernona [facility]iegerLaura at [facility] [phone]) and made her aware that I have sent Guinevere [facility]Xenazine to [facility] since they can not obtain it through their pharmacy.

## 2017-11-24 ENCOUNTER — Telehealth: Payer: Self-pay | Admitting: Neurology

## 2017-11-24 NOTE — Telephone Encounter (Signed)
Tried to call back but it was a main number. I have submitted all prior authorization paperwork.

## 2017-11-24 NOTE — Telephone Encounter (Signed)
[facility] left a voicemail message regarding a prescription for Eric Waters and would like a call back at [phone]

## 2017-11-25 ENCOUNTER — Telehealth: Payer: Self-pay | Admitting: Neurology

## 2017-11-25 MED ORDER — TETRABENAZINE 12.5 MG PO TABS: ORAL_TABLET | ORAL | 0 refills | Status: DC

## 2017-11-25 NOTE — Telephone Encounter (Signed)
Need updated RX for patient. RX written and sent to [facility] as requested.

## 2017-12-16 ENCOUNTER — Telehealth: Payer: Self-pay | Admitting: Neurology

## 2017-12-16 NOTE — Telephone Encounter (Signed)
Orders faxed to [phone] with confirmation received.

## 2017-12-16 NOTE — Telephone Encounter (Signed)
[name] called in regards to wanting to change pt's medication CB#[phone]

## 2017-12-16 NOTE — Telephone Encounter (Signed)
Called [name] and they need order to d/c halodol and start [place] faxed to them.

## 2017-12-28 ENCOUNTER — Telehealth: Payer: Self-pay | Admitting: Neurology

## 2017-12-28 DIAGNOSIS — Z79899 Other long term (current) drug therapy: Secondary | ICD-10-CM

## 2017-12-28 NOTE — Telephone Encounter (Signed)
Briant Sitesebecca lewis from [facility] called and needs to talk to someone about medication they cant get it. [facility]  xenavine  cb [phone]

## 2017-12-28 NOTE — Telephone Encounter (Signed)
Eric Waters made aware that RX was sent [facility] for [place]. She was given the number to contact them.

## 2017-12-28 NOTE — Telephone Encounter (Signed)
Received call from [facility] ([phone]) who need permission to release Eric Waters since it has an interaction with patient's Prozac (prolonged QT interval).   Per Dr. Arbutus Leasat we need EKG. Order sent to [name] to obtain EKG prior to starting [place].

## 2018-01-07 ENCOUNTER — Telehealth: Payer: Self-pay | Admitting: Neurology

## 2018-01-07 NOTE — Telephone Encounter (Signed)
I wanted to see patient back a few months after starting med but looks like just started 12/28/17.  Probably too early to see him back, especially since transportation difficult and have to have EMS transport.  May want to see if family wants to move back f/u.  Happy to see him if they want though

## 2018-01-10 NOTE — Telephone Encounter (Signed)
Eric JoinerRebecca called back from [name] and we r/s appt. Patient not starting medication until tomorrow.

## 2018-01-10 NOTE — Telephone Encounter (Signed)
Called and left message with Fraser Dinreston, family member, to call me back.   I also called facility, was placed on hold for 10+ minutes and never spoke with anyone.

## 2018-01-11 ENCOUNTER — Ambulatory Visit: Payer: Self-pay | Admitting: Neurology

## 2018-01-12 ENCOUNTER — Ambulatory Visit: Payer: Self-pay | Admitting: Neurology

## 2018-01-17 ENCOUNTER — Telehealth: Payer: Self-pay | Admitting: Neurology

## 2018-01-17 NOTE — Telephone Encounter (Signed)
Patient is still on first week of Xenazine dose and will still have some chorea until up to therapeutic dose. This should get better. Lurena JoinerRebecca made aware. They will monitor.

## 2018-01-17 NOTE — Telephone Encounter (Signed)
Briant Sitesebecca Lewis from [facility] called and left message on voicemail about patient medication not working and needs to talk to someone

## 2018-03-04 ENCOUNTER — Other Ambulatory Visit (HOSPITAL_COMMUNITY): Payer: Self-pay | Admitting: Internal Medicine

## 2018-03-04 DIAGNOSIS — R1310 Dysphagia, unspecified: Secondary | ICD-10-CM

## 2018-03-08 ENCOUNTER — Ambulatory Visit (HOSPITAL_COMMUNITY)
Admission: RE | Admit: 2018-03-08 | Discharge: 2018-03-08 | Disposition: A | Discharge status: Home / Self Care | Payer: Medicare Other | Source: Ambulatory Visit | Attending: Internal Medicine | Admitting: Internal Medicine

## 2018-03-08 DIAGNOSIS — R1312 Dysphagia, oropharyngeal phase: Secondary | ICD-10-CM | POA: Diagnosis present

## 2018-03-08 DIAGNOSIS — R1310 Dysphagia, unspecified: Secondary | ICD-10-CM

## 2018-03-08 NOTE — Progress Notes (Addendum)
Modified Barium Swallow Progress Note  Patient Details  Name: Eric Waters MRN: [id number] Date of Birth: September 07, 1951  Today's Date: 03/08/2018  Modified Barium Swallow completed.  Full report located under Chart Review in the Imaging Section.  Brief recommendations include the following:  Clinical Impression    Pt presents with a Mod oropharyngeal dysphagia characterized by inconsistently discoordinated laryngeal and oral movements and Mod pharyngeal residue leading to penetration/aspiration with nectar thick and thin liquids. Pt's oral phase is significant for reduced control, cohesion, and manipulation, leading to premature spillage of all liquids. Frank aspiration was observed with thin liquid spilling directly into the airway before swallow initiation. Nectar was initially contained in the pyriform sinuses; however, the presence of residue from solid trials redirected premature spillage of nectar into airway. Pt was unable to effectively clear penetrates/aspirares from laryngeal vestibule despite Max verbal/visual cues and most aspiration occurred silently. Pt was able to best control boluses and protect her airway with honey thick liquids and puree solids for trials tested, although these consisitencies left increased residue. Therefore, recommend Dys 1 (puree) solids and honey thick liquids with aspiration precautions (small bites/sips, sitting upright). Given fluctuating swallow/airway protection, residue with thicker consisitencies, and progressive nature of pt's condition, pt is still at risk of aspiration even on a modified diet. Recommend additional SLP follow up and further discussion of pt's goals of care with primary care team.    Swallow Evaluation Recommendations       SLP Diet Recommendations: Dysphagia 1 (Puree) solids;Honey thick liquids   Liquid Administration via: Cup   Medication Administration: Whole meds with puree   Supervision: Full supervision/cueing for  compensatory strategies;Staff to assist with self feeding   Compensations: Minimize environmental distractions;Slow rate;Small sips/bites   Postural Changes: Seated upright at 90 degrees   Oral Care Recommendations: Oral care BID       [place] Yajahira Tison SLP Student Clinician  [place] Raysean Graumann 03/08/2018,3:39 PM

## 2018-03-15 NOTE — Progress Notes (Signed)
Eric Waters was seen today in neurologic consultation at the request of Renford Dills, MD.  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  Patient was previously cared for at [facility], but has not been seen here there since early 2015.  I have reviewed records available. Patient presents with husband of his first cousin, who supplements the history.  Patient was diagnosed with Huntington's disease about 25 years ago.  His mother, maternal GM and "several other relatives" have HD.  Pt has 4 biological children and 1 adopted child.  As far as family present knows, the children have not been tested.  Two of his children have children themselves.    He has been treated with Prozac, 40 mg daily and Haldol 1 mg daily.  His prozac has been reduced now to 5 mg daily.  Patient denies depression and his family member present states that patient has never been depressed.  Pt lived with this family member for year.   His haldol is now at 0.5 mg bid.  Family doesn't think that it every really helped the chorea.   Pt currently living at [name].  Was living with family when being treated at [facility].  Family states that he had significant chorea until stroke in 2015 and then chorea stopped.  In the last 6-8 months, chorea has returned and has increased, so much so that he has fallen out of the wheelchair.  Family gets call virtually every day or every other day that he slips out of the [facility] from movement.  They don't recall any other medication being used for movement.   Family has purchased 3 lazy boy recliners and he has rocked the screws out of all of them.  At SNF, he is normally in a WC and he doesn't need to be strapped in or have a tray in front of him.    The patient had a modified barium swallow in August, 2018.  I reviewed this.  There is evidence of mild to moderate pharyngeal dysphagia.  A dysphagia 2 diet was recommended (fine CHOP solid with thin liquid, no straw).   Neuroimaging has   previously been performed.  It is available for my review today.  [facility] of [facility] after he presented with confusion and facial droop.  This demonstrated acute infarcts in the left MCA territory.  He was placed on aspirin and statin was recommended once he was able to swallow.  He is on lipitor now.  I have reviewed hospital records from that 2015 hospitalization, and reviewed the MRI.  03/16/18 update: Patient is seen today in follow-up.  He is accompanied by a caregiver at the SNF.  Despite the fact that I saw the patient back in December, it took until mid February (January 11, 2017) to actually get him started on Xenaxine titration.  He is currently on Xenazine, 25 mg tid.   he is off of Haldol.   ALLERGIES:   Allergies  Allergen Reactions  . Floxin [Ofloxacin]   . Codeine Hives and Rash    CURRENT MEDICATIONS:  Outpatient Encounter Medications as of 03/16/2018  Medication Sig  . acetaminophen (TYLENOL) 325 MG tablet Take 650 mg by mouth every 6 (six) hours as needed.  Marland Kitchen apixaban (ELIQUIS) 5 MG TABS tablet Take 5 mg by mouth 2 (two) times daily.  Marland Kitchen atorvastatin (LIPITOR) 20 MG tablet Take 1 tablet (20 mg total) by mouth daily at 6 PM.  . lisinopril (  PRINIVIL,ZESTRIL) 2.5 MG tablet Take 2.5 mg by mouth daily.  Marland Kitchen tetrabenazine (XENAZINE) 25 MG tablet Take 1 tablet (25 mg total) by mouth 3 (three) times daily.  . Vitamin D, Ergocalciferol, (DRISDOL) 50000 units CAPS capsule Take 50,000 Units by mouth every 7 (seven) days.  . [DISCONTINUED] aspirin EC 81 MG tablet Take 81 mg by mouth daily.  . [DISCONTINUED] cholecalciferol (VITAMIN D) 1000 units tablet Take 1,000 Units by mouth daily.  . [DISCONTINUED] FLUoxetine (PROZAC) 10 MG tablet Take 5 mg by mouth daily.  . [DISCONTINUED] haloperidol (HALDOL) 2 MG/ML solution Take 2.5 mLs (5 mg total) by mouth 2 (two) times daily.  . [DISCONTINUED] tetrabenazine (XENAZINE) 12.5 MG tablet 1 tab AM for 1 week; 1 tablet BID for 1  week; 1 tab TID for 1 week; 2 tablets in AM, 1 tablet in afternoon, 1 tablet in the evening for 1 week 2 tablets in AM, 1 tablet in afternoon, 2 tablets in the evening for 1 week Then switch to 25 mg tablets   No facility-administered encounter medications on file as of 03/16/2018.     PAST MEDICAL HISTORY:   Past Medical History:  Diagnosis Date  . Allergy   . Chronic kidney disease   . CVA (cerebral vascular accident) ([name])   . Depression   . Dysphagia   . GERD (gastroesophageal reflux disease)   . Huntington chorea [facility])    previously followed at [facility]- Dr Boris Sharper [phone] [name])  . Hyperlipidemia   . Hypertension   . Vitamin D deficiency     PAST SURGICAL HISTORY:   Past Surgical History:  Procedure Laterality Date  . TONSILLECTOMY  1959    SOCIAL HISTORY:   Social History   Socioeconomic History  . Marital status: Single    Spouse name: Not on file  . Number of children: Not on file  . Years of education: Not on file  . Highest education level: Not on file  Occupational History  . Not on file  Social Needs  . Financial resource strain: Not on file  . Food insecurity:    Worry: Not on file    Inability: Not on file  . Transportation needs:    Medical: Not on file    Non-medical: Not on file  Tobacco Use  . Smoking status: Never Smoker  . Smokeless tobacco: Never Used  . Tobacco comment: Single, separated from wife 2003. Lives with friend of family. Moved to [name] area from [place] spring 2010 to be closer to [facility] and caregiver  Substance and Sexual Activity  . Alcohol use: No  . Drug use: No  . Sexual activity: Not on file  Lifestyle  . Physical activity:    Days per week: Not on file    Minutes per session: Not on file  . Stress: Not on file  Relationships  . Social connections:    Talks on phone: Not on file    Gets together: Not on file    Attends religious service: Not on file    Active member of club or organization: Not on file     Attends meetings of clubs or organizations: Not on file    Relationship status: Not on file  . Intimate partner violence:    Fear of current or ex partner: Not on file    Emotionally abused: Not on file    Physically abused: Not on file    Forced sexual activity: Not on file  Other Topics Concern  .  Not on file  Social History Narrative   Pt is separated from wife since 2003, moved from [place] spring 2010 to be closer to [facility] and caregivers. Lives with a friend of the family. Pt does not get regular exercise.    FAMILY HISTORY:   Family Status  Relation Name Status  . Father  Deceased  . MGM  (Not Specified)       Hx Huntington chorea  . Mother  Alive  . Sister  Alive  . Brother  Alive  . Mat Uncle  (Not Specified)  . MGF  (Not Specified)    ROS:  Unable to obtain   PHYSICAL EXAMINATION:    VITALS:   Vitals:   03/16/18 0958  BP: (!) 92/52  Pulse: 62  SpO2: 95%    GEN:  Normal appears male in no acute distress.  Appears stated age.  Follows simple commands. HEENT:  Normocephalic, atraumatic. The mucous membranes are moist. The superficial temporal arteries are without ropiness or tenderness. Cardiovascular: Regular rate and rhythm. Lungs: Clear to auscultation bilaterally. Neck/Heme: There are no carotid bruits noted bilaterally.  NEUROLOGICAL: Orientation:  The patient is alert.  He is mildly somnolent but stays awake and participates with examination Cranial nerves: There is good facial symmetry.   He tracks appropriately about the room.  His speech is fluent, but quite dysarthric.  His speech is bulbar.  Soft palate rises symmetrically and there is no tongue deviation. Hearing appears to be intact to conversational tone. Tone: There may be increased tone in the right upper extremity, but he resists testing. Sensation: Patient has difficulty participating with sensitive aspects of the sensory testing. Coordination:  The patient has trouble participating and is  apraxic Motor: The R hand is held in a fist with the thumb between the pointer and middle finger.  He has an OT apparatus on the right hand due to skin breakdown in hand from being fisted.  Strength in the right arm and right leg are at least antigravity.  He has trouble participating with manual motor testing, particularly in the right arm.  Grip strength on the left is very good.  Likewise, strength in the left arm and left leg are at least 5-/5 and likely 5/5. DTR's: Deferred Gait and Station: The patient is unable to ambulate any longer.  He is on a stretcher. Abnormal movements: There is rare tremor of the right hand (only seen one time while in the room and was momentary).  There is no chorea at all.   IMPRESSION/PLAN  1. Huntington's disease  -Long discussion with the patient and family regarding diagnosis and pathophysiology.  Discussed neurodegenerative nature.  . Talked about the implications for family, particularly his children.  Talked about the fact that this is an autosomal dominant disease. He and his family are well aware of all of this.  It doesn't sound like he interacts with his children much and his cousin is primary family contact.  Talked about the fact that over the long-term, this can involve more than just abnormal movements.  This can involve mood and cognitive changes.  He has certainly had cognitive change.  Fortunately, his mood has remained quite good.    -His disease has certainly affected swallowing, and  the patient had a modified barium swallow in August, 2018.   There is evidence of mild to moderate pharyngeal dysphagia.  A dysphagia 2 diet was recommended (fine CHOP solid with thin liquid, no straw).this can also affect swallowing and  ability to ambulate.    -off of haldol and no chorea on xenazine 25 mg tid.  He is more somnolent than last visit and SNF caregiver reports like this for few days.  Usually don't see that with xenazine.  Will order cbc, chem, UA with c  &S.  If negative, will consider reducing xenazine to bid and hope that it helps somnolence without bringing back chorea.    2.  History of cerebral infarction in 2015  -Talked about stroke risk factors  -Talked about importance of blood pressure control with a goal <130/80 mm Hg.   -Talked about importance of lipid control and proper diet.  Lipids should be managed intensively, with a goal LDL < 70 mg/dL.  On statin (lipitor)  -on eliquis  3.  F/u in 6-8 months.     Cc:  Renford DillsPolite, Ronald, MD

## 2018-03-16 ENCOUNTER — Encounter: Payer: Self-pay | Admitting: Neurology

## 2018-03-16 ENCOUNTER — Ambulatory Visit (INDEPENDENT_AMBULATORY_CARE_PROVIDER_SITE_OTHER): Payer: Medicare Other | Admitting: Neurology

## 2018-03-16 VITALS — BP 92/52 | HR 62 | Temp 98.6°F

## 2018-03-16 DIAGNOSIS — G10 Huntington's disease: Secondary | ICD-10-CM

## 2018-03-16 DIAGNOSIS — R1319 Other dysphagia: Secondary | ICD-10-CM

## 2018-03-16 DIAGNOSIS — R400 Somnolence: Secondary | ICD-10-CM

## 2018-04-28 ENCOUNTER — Other Ambulatory Visit: Payer: Self-pay | Admitting: Neurology

## 2018-04-28 ENCOUNTER — Telehealth: Payer: Self-pay | Admitting: Neurology

## 2018-04-28 DIAGNOSIS — R7309 Other abnormal glucose: Secondary | ICD-10-CM

## 2018-04-28 DIAGNOSIS — G10 Huntington's disease: Secondary | ICD-10-CM

## 2018-04-28 DIAGNOSIS — Z79899 Other long term (current) drug therapy: Secondary | ICD-10-CM

## 2018-04-28 NOTE — Telephone Encounter (Signed)
Tia made aware.   Order faxed to [name] at [phone].

## 2018-04-28 NOTE — Telephone Encounter (Signed)
[name], NP, called to see about decreasing patient's Xenazine. He is having increased lethargy. I did let her know that we hadn't received lab results back and had requested those. They will send these over then I will call her back with Dr. Don Perkingat's recommendations. [phone].

## 2018-04-28 NOTE — Telephone Encounter (Signed)
Labs received and given to Dr. Arbutus Leasat.   Dr. Arbutus Leasat - please advise.

## 2018-04-28 NOTE — Telephone Encounter (Signed)
Labs are reviewed.  He had an urinalysis on March 18, 2018 that was normal.  He also had a CBC on April 25, 2018.  White blood cells were 6.8, hemoglobin 13.6, hematocrit 40.9 and platelets 243.  Sodium was 138, potassium 4.2, chloride 104, CO2 23, BUN 19, creatinine 0.92.  Glucose was 160.  Jade, okay to back down xenazine to bid dosing BUT also get TSH and HgbA1C given glu was 160 on labwork.

## 2018-05-03 ENCOUNTER — Telehealth: Payer: Self-pay | Admitting: Neurology

## 2018-05-03 NOTE — Telephone Encounter (Signed)
Can you f/u with SNF.  Was supposed to have TSH and A1C done for me.  Also we decreased his xenazine.  Did it help his lethargy?

## 2018-05-05 NOTE — Telephone Encounter (Signed)
Still have not received labs. Called again and waiting for help x 20 minutes. They state they will fax again.

## 2018-05-05 NOTE — Telephone Encounter (Signed)
Spoke with Reuel Boomaniel, RN at [facility], who states patient much less lethargic since decrease in medicine. He is much more "jittery" and movements worse. He has fallen twice in the last 24 hours. No injuries.   He is faxing requested lab work.

## 2018-05-06 NOTE — Telephone Encounter (Signed)
Still have not received lab results. Dr. Arbutus Leasat - FYI on Lethargy.

## 2018-05-06 NOTE — Telephone Encounter (Signed)
Obviously there is not a smaller dosage between tid and bid.  Can try to get austedo approved but not sure it will be.  Still need labs.Marland Kitchen..Marland Kitchen

## 2018-05-06 NOTE — Telephone Encounter (Signed)
He was on Xenazine 25 mg - 1 TID and is now on Xenazine 25 - 1 BID.   He has movements while in wheelchair and propels himself out of the chair resulting in a fall.

## 2018-05-06 NOTE — Telephone Encounter (Signed)
How did he fall?  I am unaware of him being able to walk?  What was dose of xenazine and what is it now?

## 2018-05-06 NOTE — Telephone Encounter (Signed)
Called Eric Waters, guardian for patient, at [phone]. Talked about what he thought about [place] - whether they preferred TID vs BID. He states he does appear happier on BID dosing. If Dr. Arbutus Leasat thinks Eric Waters is the best way to go he is okay with trying that as well. He asked if I would email consent form to [email or web address].

## 2018-07-06 ENCOUNTER — Telehealth: Payer: Self-pay | Admitting: Neurology

## 2018-07-06 ENCOUNTER — Telehealth: Payer: Self-pay | Admitting: Hospice and Palliative Medicine

## 2018-07-06 ENCOUNTER — Non-Acute Institutional Stay: Payer: Medicare Other | Admitting: Hospice and Palliative Medicine

## 2018-07-06 DIAGNOSIS — Z515 Encounter for palliative care: Secondary | ICD-10-CM

## 2018-07-06 NOTE — Telephone Encounter (Signed)
Josh [facility]. Please Call [phone]. Thanks

## 2018-07-06 NOTE — Telephone Encounter (Signed)
I spoke with patient's Eric BaliHCPOA, Eric Waters, who says he is patient's cousin. He describes seeing some decline over the past few months and is concerned that patient is much less interactive. He says that he does not think that these symptoms are typical for Huntington's and instead attributes them to the xenazine. He says he would like the dose of the xenazine reduced or discontinued to see if patient has an improvement. I note that patient is followed by neurology, Dr. Arbutus Leasat, and that medication adjustments were discussed back in June, 2019. I called and left a message with Dr. Don Perkingat's nurse.   I also note that patient is followed by psychiatry for depression.  I discussed weight loss and possible future decline with [name]. He says that he recognizes that patient will likely eventually die as a result of the Huntington's or sequelae. He seems to want quality of life over quantity. He says that he does not view patient's current living as quality but "merely existing." We talked about the future option of hospice but he said he did not yet want to pursue that.

## 2018-07-06 NOTE — Progress Notes (Signed)
    PALLIATIVE CARE CONSULT VISIT   PATIENT NAME: Eric SeveranceAlonzo Dobratz Jr. DOB: Feb 27, 1951 MRN: [id number]  PRIMARY CARE PROVIDER:   Renford DillsPolite, Ronald, MD  REFERRING PROVIDER:  Renford DillsPolite, Ronald, MD [address] [place], [place] [address]  RESPONSIBLE PARTY:   [name] - [phone]  ASSESSMENT:     Patient has had recent reduced oral intake with weight loss from 160lbs to 154lbs in past month. Today, he denies any pain or other distressing symptoms. He is comfortable appearing.   I tried calling patient's HCPOA, [name], but did not reach him. Will try again.   Patient has a MOST form in chart detailing plan for DNAR, limited scope of treatment, IVF/antibiotics if indicated, and no feeding tube.   RECOMMENDATIONS and PLAN:  1. Continue supportive care 2. Maximize po supplements due to weight loss 3. Will try to reach HCPOA to discuss goals.   I spent 30 minutes providing this consultation,  from 1100 to 1130. More than 50% of the time in this consultation was spent coordinating communication.   HISTORY OF PRESENT ILLNESS:  Eric Severancelonzo Ballantine Jr. is a 67 y.o. year old male with multiple medical problems including Huntington's chorea, CKD III, h/o CVA with expressive aphasia, PVD, and depression, who has had recent weight loss. Palliative Care was asked to help address goals of care.   CODE STATUS: DNR  PPS: 30% HOSPICE ELIGIBILITY/DIAGNOSIS: TBD  PAST MEDICAL HISTORY:  Past Medical History:  Diagnosis Date  . Allergy   . Chronic kidney disease   . CVA (cerebral vascular accident) ([name])   . Depression   . Dysphagia   . GERD (gastroesophageal reflux disease)   . Huntington chorea [facility]([name])    [facility] Dr Boris SharperFrancis Walker [phone] [name]  . Hyperlipidemia   . Hypertension   . Vitamin D deficiency     SOCIAL HX:  Social History   Tobacco Use  . Smoking status: Never Smoker  . Smokeless tobacco: Never Used  . Tobacco comment: Single, separated  from wife 2003. Lives with friend of family. Moved to [name] area from [place] spring [place] [name]  Substance Use Topics  . Alcohol use: No    ALLERGIES:  Allergies  Allergen Reactions  . Floxin [Ofloxacin]   . Codeine Hives and Rash     PERTINENT MEDICATIONS:  Outpatient Encounter Medications as of 07/06/2018  Medication Sig  . acetaminophen (TYLENOL) 325 MG tablet Take 650 mg by mouth every 6 (six) hours as needed.  Marland Kitchen. apixaban (ELIQUIS) 5 MG TABS tablet Take 5 mg by mouth 2 (two) times daily.  Marland Kitchen. atorvastatin (LIPITOR) 20 MG tablet Take 1 tablet (20 mg total) by mouth daily at 6 PM.  . lisinopril (PRINIVIL,ZESTRIL) 2.5 MG tablet Take 2.5 mg by mouth daily.  Marland Kitchen. tetrabenazine (XENAZINE) 25 MG tablet Take 1 tablet (25 mg total) by mouth 2 (two) times daily.  . Vitamin D, Ergocalciferol, (DRISDOL) 50000 units CAPS capsule Take 50,000 Units by mouth every 7 (seven) days.   No facility-administered encounter medications on file as of 07/06/2018.     PHYSICAL EXAM:   General: NAD, frail appearing, thin, in wheelchair Cardiovascular: regular rate and rhythm Pulmonary: clear ant fields Abdomen: soft, nontender, + bowel sounds Extremities: no edema, hand contractures Skin: no rashes Neurological: Weakness, chorea movements, follows simple commands  Malachy MoanJOSHUA R BORDERS, NP

## 2018-07-07 NOTE — Telephone Encounter (Signed)
He was on xenazine 25 mg tid and had no movements but had somnolence.  Dropped it to bid and SNF called with movements.  That is when austedo was recommended.  If they don't want to pursue and want to come off of the xenazine then do q day for a week and then d/c.  The movements will likely come back.

## 2018-07-07 NOTE — Telephone Encounter (Signed)
LMOM for Eric Waters to call back. Reviewing his note from yesterday it looks like patient's POA is interested in stopping or lowering dose of Xenazine. I did discuss this with Zenon Mayoreston, POA, in June and he asked me to email [place] form to him for him to sign so that we could switch patient's therapy. I did send him the form, which he confirmed he received, but did not get back to me about it.  Dr. Arbutus Leasat - please review note from Palliative care yesterday and let me know what you would like to do with patient's Xenazine medication.

## 2018-07-07 NOTE — Telephone Encounter (Signed)
LMOM for [name] to call back to discuss.

## 2018-07-11 ENCOUNTER — Non-Acute Institutional Stay: Payer: Medicare Other | Admitting: Hospice and Palliative Medicine

## 2018-07-11 NOTE — Telephone Encounter (Signed)
Will have to await call back to get permission from POA that he wants to discontinue medication.

## 2018-07-12 NOTE — Progress Notes (Signed)
Case discussed with nursing staff. Patient clinically unchanged from when seen last week. Telephone notes from Dr [name] reviewed. They are awaiting word from POA on how to proceed with medication adjustment.

## 2018-08-10 NOTE — Progress Notes (Signed)
Eric SeveranceAlonzo Orea Jr. was seen today in neurologic consultation at the request of Renford DillsPolite, Ronald, MD.  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  Patient [facility], but has not been seen here there since early 2015.  I have reviewed records available. Patient presents with husband of his first cousin, who supplements the history.  Patient was diagnosed with Huntington's disease about 25 years ago.  His mother, maternal GM and "several other relatives" have HD.  Pt has 4 biological children and 1 adopted child.  As far as family present knows, the children have not been tested.  Two of his children have children themselves.    He has been treated with Prozac, 40 mg daily and Haldol 1 mg daily.  His prozac has been reduced now to 5 mg daily.  Patient denies depression and his family member present states that patient has never been depressed.  Pt lived with this family member for year.   His haldol is now at 0.5 mg bid.  Family doesn't think that it every really helped the chorea.   Pt currently living at [name].  Was living with family when being [facility].  Family states that he had significant chorea until stroke in 2015 and then chorea stopped.  In the last 6-8 months, chorea has returned and has increased, so much so that he has fallen out of the wheelchair.  Family gets call virtually every day or every other day that [facility].  They don't recall any other medication being used for movement.   Family has purchased 3 lazy boy recliners and he has rocked the screws out of all of them.  At SNF, he is normally in a WC and he doesn't need to be strapped in or have a tray in front of him.    The patient had a modified barium swallow in August, 2018.  I reviewed this.  There is evidence of mild to moderate pharyngeal dysphagia.  A dysphagia 2 diet was recommended (fine CHOP solid with thin liquid, no straw).   Neuroimaging has   previously been performed.  It is available for my review today.  [facility] after [facility] with confusion and facial droop.  This demonstrated acute infarcts in the left MCA territory.  He was placed on aspirin and statin was recommended once he was able to swallow.  He is on lipitor now.  I have reviewed hospital records from that 2015 hospitalization, and reviewed the MRI.  03/16/18 update: Patient is seen today in follow-up.  He is accompanied by a caregiver at the SNF.  Despite the fact that I saw the patient back in December, it took until mid February (January 11, 2017) to actually get him started on Xenaxine titration.  He is currently on Xenazine, 25 mg tid.   he is off of Haldol.  08/11/18 update:  Pt is seen today in f/u for HD.  He is and is seen was decreased to 25 mg twice per day because of complaints of lethargy.  Lethargy did improve somewhat, but he had more movements and was falling out of the chair because of the movements.  Ultimately, his POA wanted him off of the medication, so I recommended trying Austedo.  We have tried multiple times to get a hold of the patient's POA regarding the Austedo, and have not heard back.  His  POA does accompany him to the office and reports that he either does not remember this or did not get the form (we emailed it).  He was evaluated for hospice but his POA was not interested in that.  I ordered lab work since last visit and while some of this came back, we were unable to get the TSH and hemoglobin A1c from the facility (it has been difficult to communicate with him).   ALLERGIES:   Allergies  Allergen Reactions  . Floxin [Ofloxacin]   . Codeine Hives and Rash    CURRENT MEDICATIONS:  Outpatient Encounter Medications as of 08/11/2018  Medication Sig  . acetaminophen (TYLENOL) 325 MG tablet Take 650 mg by mouth every 6 (six) hours as needed.  Marland Kitchen apixaban (ELIQUIS) 5 MG TABS tablet Take 5 mg by mouth 2 (two)  times daily.  Marland Kitchen atorvastatin (LIPITOR) 20 MG tablet Take 1 tablet (20 mg total) by mouth daily at 6 PM.  . tetrabenazine (XENAZINE) 25 MG tablet Take 1 tablet (25 mg total) by mouth 2 (two) times daily.  . Vitamin D, Ergocalciferol, (DRISDOL) 50000 units CAPS capsule Take 50,000 Units by mouth every 7 (seven) days.  . [DISCONTINUED] lisinopril (PRINIVIL,ZESTRIL) 2.5 MG tablet Take 2.5 mg by mouth daily.   No facility-administered encounter medications on file as of 08/11/2018.     PAST MEDICAL HISTORY:   Past Medical History:  Diagnosis Date  . Allergy   . Chronic kidney disease   . CVA (cerebral vascular accident) ([name])   . Depression   . Dysphagia   . GERD (gastroesophageal reflux disease)   . Huntington chorea [facility])    previously followed at [facility]- Dr Boris Sharper [phone] [name])  . Hyperlipidemia   . Hypertension   . Vitamin D deficiency     PAST SURGICAL HISTORY:   Past Surgical History:  Procedure Laterality Date  . TONSILLECTOMY  1959    SOCIAL HISTORY:   Social History   Socioeconomic History  . Marital status: Single    Spouse name: Not on file  . Number of children: Not on file  . Years of education: Not on file  . Highest education level: Not on file  Occupational History  . Not on file  Social Needs  . Financial resource strain: Not on file  . Food insecurity:    Worry: Not on file    Inability: Not on file  . Transportation needs:    Medical: Not on file    Non-medical: Not on file  Tobacco Use  . Smoking status: Never Smoker  . Smokeless tobacco: Never Used  . Tobacco comment: Single, separated from wife 2003. Lives with friend of family. Moved to [name] area from [place] spring 2010 to be closer to [facility] and caregiver  Substance and Sexual Activity  . Alcohol use: No  . Drug use: No  . Sexual activity: Not on file  Lifestyle  . Physical activity:    Days per week: Not on file    Minutes per session: Not on file  . Stress: Not on file    Relationships  . Social connections:    Talks on phone: Not on file    Gets together: Not on file    Attends religious service: Not on file    Active member of club or organization: Not on file    Attends meetings of clubs or organizations: Not on file    Relationship status: Not on file  . Intimate partner violence:  Fear of current or ex partner: Not on file    Emotionally abused: Not on file    Physically abused: Not on file    Forced sexual activity: Not on file  Other Topics Concern  . Not on file  Social History Narrative   Pt is separated from wife since 2003, moved [place] spring 2010 to be [place] to [facility] and caregivers. Lives with a friend of the family. Pt does not get regular exercise.    FAMILY HISTORY:   Family Status  Relation Name Status  . Father  Deceased  . MGM  (Not Specified)       Hx Huntington chorea  . Mother  Alive  . Sister  Alive  . Brother  Alive  . Mat Uncle  (Not Specified)  . MGF  (Not Specified)    ROS:  Review of Systems  Unable to perform ROS: Medical condition     PHYSICAL EXAMINATION:    VITALS:   Vitals:   08/11/18 1413 08/11/18 1440  BP: (!) 82/56 98/62  Pulse: (!) 58   SpO2: 95%     GEN:  Normal appears male in no acute distress.  Patient is lethargic, but easily arousable.  He is able to stay "I feel okay." HEENT:  Normocephalic, atraumatic. The mucous membranes are moist. The superficial temporal arteries are without ropiness or tenderness. Cardiovascular: Regular rate and rhythm. Lungs: Clear to auscultation bilaterally. Neck/Heme: There are no carotid bruits noted bilaterally.  NEUROLOGICAL: Orientation:  The patient is unable to answer questions of orientation.  He answers a few very simple questions. Cranial nerves: There is good facial symmetry.   He does not participate with extraocular muscle testing.  His speech is very limited today, but he is dysarthric.  Tone: There may be increased tone in the right upper  extremity, but he resists testing.  The right hand is fisted Sensation: Patient does not participate Coordination:  The patient does not participate Motor: The R hand is held in a fist with the thumb between the pointer and middle finger.  Strength in the right upper and left upper extremity are at least antigravity.  He is very apraxic when attempting manual muscle testing.  He refuses to lift either leg off of the stretcher DTR's: Deferred Gait and Station: The patient is unable to ambulate any longer.  He is on a stretcher. Abnormal movements: There is no tremor.  The only choreiform movement I saw was one time in the room and it was in the face.  Labs  He had an urinalysis on March 18, 2018 that was normal.  He also had a CBC on April 25, 2018.  White blood cells were 6.8, hemoglobin 13.6, hematocrit 40.9 and platelets 243.  Sodium was 138, potassium 4.2, chloride 104, CO2 23, BUN 19, creatinine 0.92.  Glucose was 160.   IMPRESSION/PLAN  1. Huntington's disease  -Long discussion with the patients power of attorney/cousin.  Eric Waters has made him fatigued and the plan is to get him off of that and change it to Austedo.  I will decrease his adenosine to 12.5 mg twice per day until we are able to get him on the Austedo.  However, his blood pressure was quite low in the office, which I do not know is acute or chronic.  He was afebrile.  I do think that this certainly could contribute to lethargy.  I asked the nursing facility to recheck it and if it remains under 100 systolic  to have a physician check it out today, either at the facility or through the emergency room.    2.  History of cerebral infarction in 2015  -Talked about stroke risk factors  -Talked about importance of blood pressure control with a goal <130/80 mm Hg.   -Talked about importance of lipid control and proper diet.  Lipids should be managed intensively, with a goal LDL < 70 mg/dL.  On statin (lipitor)  -on eliquis  3.  hypotension  -BP very low in the office today.  As above, they are to recheck it.  The EMT was very helpful today and she stated that she would try to find the blood pressure trends at the nursing facility before she left and encourage them to follow-up on his blood pressures today.       Cc:  Renford Dills, MD

## 2018-08-11 ENCOUNTER — Encounter: Payer: Self-pay | Admitting: Neurology

## 2018-08-11 ENCOUNTER — Ambulatory Visit (INDEPENDENT_AMBULATORY_CARE_PROVIDER_SITE_OTHER): Payer: Medicare Other | Admitting: Neurology

## 2018-08-11 VITALS — BP 98/62 | HR 58

## 2018-08-11 DIAGNOSIS — G10 Huntington's disease: Secondary | ICD-10-CM

## 2018-08-16 ENCOUNTER — Ambulatory Visit: Payer: Self-pay | Admitting: Neurology

## 2018-10-04 ENCOUNTER — Encounter: Payer: Self-pay | Admitting: Internal Medicine

## 2018-10-04 ENCOUNTER — Non-Acute Institutional Stay: Payer: Medicare Other | Admitting: Internal Medicine

## 2018-10-04 VITALS — BP 100/64 | HR 64 | Resp 16 | Ht 67.0 in | Wt 146.6 lb

## 2018-10-04 DIAGNOSIS — Z7189 Other specified counseling: Secondary | ICD-10-CM

## 2018-10-04 DIAGNOSIS — R296 Repeated falls: Secondary | ICD-10-CM

## 2018-10-04 DIAGNOSIS — Z515 Encounter for palliative care: Secondary | ICD-10-CM

## 2018-10-04 DIAGNOSIS — R634 Abnormal weight loss: Secondary | ICD-10-CM

## 2018-10-04 DIAGNOSIS — G10 Huntington's disease: Secondary | ICD-10-CM

## 2018-10-04 NOTE — Progress Notes (Signed)
[facility] Telephone: [phone] Fax: [phone]  PATIENT NAME: Eric Waters. DOB: [birth year] MRN: [id number]  PRIMARY CARE PROVIDER: Dr. Sharlyne Pacas, Haynes Kerns FNP-C [facility]  REFERRING PROVIDER:   Renford Dills, MD [address] [place], [place] [address]   Followed byDr. [name] (neurology).   RESPONSIBLE PARTY:   Tori Milks - [phone] (? husband of first cousin) HCPOA  RECOMMENDATIONS and PLAN:  1. Huntington's chorea: Patient's speech is difficult to understand, but he can answers questions with one or two word responses, and can follow simple commands. He appears alert and oriented x3. He was having lethargy and hypotension on his Xenazine 25mg  tid. This was tapered down to current dose of 12.5 bid. He seems to be tolerating this well without excessive daytime somnolence or lethargy, and he is maintaining his BP greater than 100 systolic.  -Continue suporattive care.  2. Weight Loss:  Patient's oral intake is 100% of a pureed diet with thickened liquids. Staff report no symptoms of aspiration when eating. His weight is 146.6 on Nov 5th, down 13.4lbs (8.4% of his body weight) over the last 3 months. Patient's HCPOA notes patient's weight last year was 186 lbs, which reflects a loss of 40 lbs over the last year. Height  5'7" reflecting a BMI of 23kg/m2.  -Would further augment po supplements and meal portions due to weight loss.  3. Frequent Falls: Patient with over all choreiform movements which staff report are more pronounced in the evenings. He is wheelchair bound. He is a heavy 2 person heavy transfer, and is unable to weight bear due to LE spasticity and R LE weakness due to an old stroke. He has right wrist, hand and finger contractures. He had frequent falls about q2 weeks. Last fall about 1 week earlier. Most falls occurred when patient wiggled and rolled out from the bottom of bed. The foot board has since been repaired so  staff are hopeful this will help.   4. Advanced Care Planning: DNR/DIN on chart. MOST form in chart detailing plan for DNAR, limited scope of treatment, IVF/antibiotics if indicated, and no feeding tube.  5. Goals of Care: Spoke with HCPOA Tori Milks who wishes referral to hospice care and services if patient were to meet eligibility criteria. We did discuss that were patient to go to hospice services, that patient's Huntington's medications would not likely be covered.  -Will follow along for evidence of functional or nutritional decline.  I spent 45 minutes providing this consultation, from 2:00pm to 2:45pm. More than 50% of the time in this consultation was spent coordinating communication.   HISTORY OF PRESENT ILLNESS:  Eric Waters. is a 67 y.o. year old male with multiple medical problems including Huntington's chorea, CKD III, h/o CVA with expressive aphasia (2015 eliquis / statins), PVD, and depression, who has had recent weight loss. Palliative Care was asked to help address goals of care.   CODE STATUS: DNR/DNI. Most form on chart.  PPS: 30%.   HOSPICE ELIGIBILITY/DIAGNOSIS: Not currently.  PAST MEDICAL HISTORY:  Past Medical History:  Diagnosis Date  . Allergy   . Chronic kidney disease   . CVA (cerebral vascular accident) ([name])   . Depression   . Dysphagia   . GERD (gastroesophageal reflux disease)   . Huntington chorea [facility])    previously followed at [facility]- Dr Boris Sharper [phone] [name])  . Hyperlipidemia   . Hypertension   . Vitamin D deficiency     SOCIAL  HX:  Social History   Tobacco Use  . Smoking status: Never Smoker  . Smokeless tobacco: Never Used  . Tobacco comment: Single, separated from wife 2003. Lives with friend of family. Moved to [name] area from [place] spring 2010 [place] [facility] and caregiver  Substance Use Topics  . Alcohol use: No    ALLERGIES:  Allergies  Allergen Reactions  . Floxin [Ofloxacin]   . Codeine Hives and Rash      PERTINENT MEDICATIONS:  Outpatient Encounter Medications as of 10/04/2018  Medication Sig  . acetaminophen (TYLENOL) 325 MG tablet Take 650 mg by mouth every 6 (six) hours as needed.  Marland Kitchen. apixaban (ELIQUIS) 5 MG TABS tablet Take 5 mg by mouth 2 (two) times daily.  Marland Kitchen. atorvastatin (LIPITOR) 20 MG tablet Take 1 tablet (20 mg total) by mouth daily at 6 PM.  . tetrabenazine (XENAZINE) 25 MG tablet Take 1 tablet (25 mg total) by mouth 2 (two) times daily.  . Vitamin D, Ergocalciferol, (DRISDOL) 50000 units CAPS capsule Take 50,000 Units by mouth every 7 (seven) days.   No facility-administered encounter medications on file as of 10/04/2018.     PHYSICAL EXAM:  VS: BP 100/64, HR 64, RR 16 General: slender AA male sitting up in recliner wheelchair, with Choreiform movements  Cardiovascular: regular rate and rhythm Pulmonary: clear ant fields Abdomen: soft, nontender, + bowel sounds GU: no suprapubic tenderness Extremities: Right  wrist and fingers contractured to 90 degrees. no edema, no joint deformities Skin: no rashes  Anselm LisMary P Odel Schmid, NP

## 2018-10-24 ENCOUNTER — Other Ambulatory Visit: Payer: Self-pay | Admitting: Neurology

## 2018-10-25 NOTE — Telephone Encounter (Signed)
From his last note with his SNF:  "He was having lethargy and hypotension on his Xenazine 25mg  tid. This was tapered down to current dose of 12.5 bid. He seems to be tolerating this well without excessive daytime somnolence or lethargy, and he is maintaining his BP greater than 100 systolic"  Paperwork was submitted for Austedo and this was approved. SNF never started patient on Austedo. Guinevere ScarletXenazine refilled today since doing well on lower dose. Patient has follow up appt.

## 2018-10-27 NOTE — Progress Notes (Deleted)
Eric SeveranceAlonzo Orea Jr. was seen today in neurologic consultation at the request of Renford DillsPolite, Ronald, MD.  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  Patient was [facility], but has not been seen here there since early 2015.  I have reviewed records available. Patient presents with husband of his first cousin, who supplements the history.  Patient was diagnosed with Huntington's disease about 25 years ago.  His mother, maternal GM and "several other relatives" have HD.  Pt has 4 biological children and 1 adopted child.  As far as family present knows, the children have not been tested.  Two of his children have children themselves.    He has been treated with Prozac, 40 mg daily and Haldol 1 mg daily.  His prozac has been reduced now to 5 mg daily.  Patient denies depression and his family member present states that patient has never been depressed.  Pt lived with this family member for year.   His haldol is now at 0.5 mg bid.  Family doesn't think that it every really helped the chorea.   Pt currently living at [name].  Was living with family when being treated at [facility].  Family states that he had significant chorea until stroke in 2015 and then chorea stopped.  In the last 6-8 months, chorea has returned and has increased, so much so that he has fallen out of the wheelchair.  Family gets call virtually every day or every other day that [facility] from [facility].  They don't recall any other medication being used for movement.   Family has purchased 3 lazy boy recliners and he has rocked the screws out of all of them.  At SNF, he is normally in a WC and he doesn't need to be strapped in or have a tray in front of him.    The patient had a modified barium swallow in August, 2018.  I reviewed this.  There is evidence of mild to moderate pharyngeal dysphagia.  A dysphagia 2 diet was recommended (fine CHOP solid with thin liquid, no straw).   Neuroimaging has   previously been performed.  It is available for my review today.  [facility] after [facility] presented with confusion and facial droop.  This demonstrated acute infarcts in the left MCA territory.  He was placed on aspirin and statin was recommended once he was able to swallow.  He is on lipitor now.  I have reviewed hospital records from that 2015 hospitalization, and reviewed the MRI.  03/16/18 update: Patient is seen today in follow-up.  He is accompanied by a caregiver at the SNF.  Despite the fact that I saw the patient back in December, it took until mid February (January 11, 2017) to actually get him started on Xenaxine titration.  He is currently on Xenazine, 25 mg tid.   he is off of Haldol.  08/11/18 update:  Pt is seen today in f/u for HD.  He is and is seen was decreased to 25 mg twice per day because of complaints of lethargy.  Lethargy did improve somewhat, but he had more movements and was falling out of the chair because of the movements.  Ultimately, his POA wanted him off of the medication, so I recommended trying Austedo.  We have tried multiple times to get a hold of the patient's POA regarding the Austedo, and have not heard back.  His  POA does accompany him to the office and reports that he either does not remember this or did not get the form (we emailed it).  He was evaluated for hospice but his POA was not interested in that.  I ordered lab work since last visit and while some of this came back, we were unable to get the TSH and hemoglobin A1c from the facility (it has been difficult to communicate with him).  10/28/18 update: Patient is seen today in follow-up for Huntington's disease.  We have been decreasing his Eric Waters because of fatigue.  He is on 12.5 mg twice per day.  The goal is to get him on Austedo, but we have been waiting on insurance.   ALLERGIES:   Allergies  Allergen Reactions  . Floxin [Ofloxacin]   . Codeine Hives and Rash    CURRENT  MEDICATIONS:  Outpatient Encounter Medications as of 10/28/2018  Medication Sig  . acetaminophen (TYLENOL) 325 MG tablet Take 650 mg by mouth every 6 (six) hours as needed.  Marland Kitchen apixaban (ELIQUIS) 5 MG TABS tablet Take 5 mg by mouth 2 (two) times daily.  Marland Kitchen atorvastatin (LIPITOR) 20 MG tablet Take 1 tablet (20 mg total) by mouth daily at 6 PM.  . tetrabenazine (XENAZINE) 12.5 MG tablet TAKE 1 TABLET BY MOUTH TWICE DAILY  . tetrabenazine (XENAZINE) 25 MG tablet Take 1 tablet (25 mg total) by mouth 2 (two) times daily.  . Vitamin D, Ergocalciferol, (DRISDOL) 50000 units CAPS capsule Take 50,000 Units by mouth every 7 (seven) days.   No facility-administered encounter medications on file as of 10/28/2018.     PAST MEDICAL HISTORY:   Past Medical History:  Diagnosis Date  . Allergy   . Chronic kidney disease   . CVA (cerebral vascular accident) ([name])   . Depression   . Dysphagia   . GERD (gastroesophageal reflux disease)   . Huntington chorea [facility])    previously followed at [facility]- Dr Boris Sharper [phone] [name])  . Hyperlipidemia   . Hypertension   . Vitamin D deficiency     PAST SURGICAL HISTORY:   Past Surgical History:  Procedure Laterality Date  . TONSILLECTOMY  1959    SOCIAL HISTORY:   Social History   Socioeconomic History  . Marital status: Single    Spouse name: Not on file  . Number of children: Not on file  . Years of education: Not on file  . Highest education level: Not on file  Occupational History  . Not on file  Social Needs  . Financial resource strain: Not on file  . Food insecurity:    Worry: Not on file    Inability: Not on file  . Transportation needs:    Medical: Not on file    Non-medical: Not on file  Tobacco Use  . Smoking status: Never Smoker  . Smokeless tobacco: Never Used  . Tobacco comment: Single, separated from wife 2003. Lives with friend of family. Moved to [name] area from [place] spring 2010 to be closer to [facility] and caregiver    Substance and Sexual Activity  . Alcohol use: No  . Drug use: No  . Sexual activity: Not on file  Lifestyle  . Physical activity:    Days per week: Not on file    Minutes per session: Not on file  . Stress: Not on file  Relationships  . Social connections:    Talks on phone: Not on file    Gets together: Not on file  Attends religious service: Not on file    Active member of club or organization: Not on file    Attends meetings of clubs or organizations: Not on file    Relationship status: Not on file  . Intimate partner violence:    Fear of current or ex partner: Not on file    Emotionally abused: Not on file    Physically abused: Not on file    Forced sexual activity: Not on file  Other Topics Concern  . Not on file  Social History Narrative   Pt is separated from wife since 2003, moved from [place] spring 2010 to be closer to [facility] and caregivers. Lives with a friend of the family. Pt does not get regular exercise.    FAMILY HISTORY:   Family Status  Relation Name Status  . Father  Deceased  . MGM  (Not Specified)       Hx Huntington chorea  . Mother  Alive  . Sister  Alive  . Brother  Alive  . Mat Uncle  (Not Specified)  . MGF  (Not Specified)    ROS:  Review of Systems  Unable to perform ROS: Medical condition     PHYSICAL EXAMINATION:    VITALS:   There were no vitals filed for this visit.  GEN:  Normal appears male in no acute distress.  Patient is lethargic, but easily arousable.  He is able to stay "I feel okay." HEENT:  Normocephalic, atraumatic. The mucous membranes are moist. The superficial temporal arteries are without ropiness or tenderness. Cardiovascular: Regular rate and rhythm. Lungs: Clear to auscultation bilaterally. Neck/Heme: There are no carotid bruits noted bilaterally.  NEUROLOGICAL: Orientation:  The patient is unable to answer questions of orientation.  He answers a few very simple questions. Cranial nerves: There is good facial  symmetry.   He does not participate with extraocular muscle testing.  His speech is very limited today, but he is dysarthric.  Tone: There may be increased tone in the right upper extremity, but he resists testing.  The right hand is fisted Sensation: Patient does not participate Coordination:  The patient does not participate Motor: The R hand is held in a fist with the thumb between the pointer and middle finger.  Strength in the right upper and left upper extremity are at least antigravity.  He is very apraxic when attempting manual muscle testing.  He refuses to lift either leg off of the stretcher DTR's: Deferred Gait and Station: The patient is unable to ambulate any longer.  He is on a stretcher. Abnormal movements: There is no tremor.  The only choreiform movement I saw was one time in the room and it was in the face.  Labs  He had an urinalysis on March 18, 2018 that was normal.  He also had a CBC on April 25, 2018.  White blood cells were 6.8, hemoglobin 13.6, hematocrit 40.9 and platelets 243.  Sodium was 138, potassium 4.2, chloride 104, CO2 23, BUN 19, creatinine 0.92.  Glucose was 160.   IMPRESSION/PLAN  1. Huntington's disease  -Long discussion with the patients power of attorney/cousin.  Eric Waters has made him fatigued and the plan is to get him off of that and change it to Austedo.  I will decrease his Xenaxine to 12.5 mg twice per day until we are able to get him on the Austedo.  However, his blood pressure was quite low in the office, which I do not know is acute or chronic.  He was afebrile.  I do think that this certainly could contribute to lethargy.  I asked the nursing facility to recheck it and if it remains under 100 systolic to have a physician check it out today, either at the facility or through the emergency room.    2.  History of cerebral infarction in 2015  -Talked about stroke risk factors  -Talked about importance of blood pressure control with a goal <130/80 mm  Hg.   -Talked about importance of lipid control and proper diet.  Lipids should be managed intensively, with a goal LDL < 70 mg/dL.  On statin (lipitor)  -on eliquis  3. hypotension  -BP very low in the office today.  As above, they are to recheck it.  The EMT was very helpful today and she stated that she would try to find the blood pressure trends at the nursing facility before she left and encourage them to follow-up on his blood pressures today.       Cc:  Renford Dills, MD

## 2018-10-28 ENCOUNTER — Ambulatory Visit: Payer: Self-pay | Admitting: Neurology

## 2018-10-28 ENCOUNTER — Telehealth: Payer: Self-pay | Admitting: Neurology

## 2018-10-28 NOTE — Telephone Encounter (Signed)
Abundio MiuKevin Cole at the nursing home where patient is at called and states that the family had a emergency and can not come  with patient today for appt. And they don't have a nurse that can come with patient wither to the appt. I have patient resch to April. Caryn BeeKevin states that the patient really needs to be seen soon so I put him on the wait list. He would like to speak to someone about what is going with the patient.  Patient is jerking a lot and falling out of bed  Please call

## 2018-10-28 NOTE — Telephone Encounter (Signed)
The first question would be why the SNF didn't contact the company to get him on austedo.  I cannot help him if they don't follow through.  We have consistently had issues with this SNF following through.

## 2018-10-28 NOTE — Telephone Encounter (Signed)
Tried to call back. Vernona RiegerLaura is not there today and Caryn BeeKevin was out at an appointment. I left a message for him to call me back concerning patient.

## 2018-11-18 ENCOUNTER — Telehealth: Payer: Self-pay | Admitting: Neurology

## 2018-11-18 NOTE — Telephone Encounter (Signed)
Teah called regarding this patient and Medication changes and wanting to know if a Prior Berkley Harveyuth has been approved? Please Call. Thanks

## 2018-11-18 NOTE — Telephone Encounter (Signed)
I have not received anything in regards to Prior Authorization.  No documentation of PA being initiated.

## 2018-11-29 NOTE — Progress Notes (Signed)
[facility] Telephone: [phone] Fax: [phone]) [phone]  PATIENT NAME: Eric SeveranceAlonzo Rock Jr. DOB: 1951/05/13 MRN: [id number] [facility]  PRIMARY CARE PROVIDER: Dr. Sharlyne PacasHssain, Haynes Kernshristine Thornsbury FNP-C [facility]  REFERRING PROVIDER:   Renford DillsPolite, Ronald, MD [address] [place], [place] [address]   Followed byDr. [name] (neurology).   RESPONSIBLE PARTY:[name] Eric Waters [phone]- [phone] (? husband of first cousin) HCPOA  INTERVAL HISTORY / HISTORY OF PRESENT ILLNESS:Eric Victory Watersis a 68 y.o.malewith  medical h/o problems  Huntington's chorea, CKD III, h/o CVA with expressive aphasia (2015 eliquis / statins), PVD, and depression, who has had ongoing weight loss. This is a routine Palliative Care follow up from 10/04/2018.   RECOMMENDATIONS and PLAN: 1.Huntington's chorea: Patient's speech continues difficult to understand, but he can answers questions with one or two word responses, and can follow simple commands. He appears alert and oriented x3. He continues to tolerate his Xenazine12.5 bid without decrease of BP below 100 systolic and without excessive daytime somnolence or lethargy.             -Continue suporattive care.  2. Weight Loss:  Patient's oral intake is 100% of a double portions pureed diet, with thickened liquids. He needs to be fed. Staff report only very occasional clearing cough when eating. Despite him higher calorie consumption, his  current weight is down to 140.6 lbs, which is a loss of 5 lbs over the last 2 months, and down 18.4 lbs over the last 5 month. Weight last year was 186 lbs, which is a loss of 45.4 lbs over the last year. Height  5'7" reflecting current BMI of 22 kg/m2.         3. Decreased mobility: Patient with over all choreiform movements which staff report are more pronounced in the evenings. He is wheelchair bound. He is a heavy 2 person heavy transfer, and is unable to weight bear due to LE spasticity and R  LE weakness due to an old stroke. He has right wrist, hand and finger contractures.   4. Advanced Care Planning: DNR and MOST forms on chart. MOST: details: limited scope of treatment, IVFs/antibiotics if indicated, and no feeding tube.  5. Goals of Care: In past telephone conversations,  HCPOA Eric Waters expressed wish for  referral to hospice care and services if patient were to meet eligibility criteria. We had discussed that were patient to go to hospice services, that patient's Huntington's medications would not likely be covered. At 9am this morning, I left a message wih Mr. Eric Waters and my contact number, should he wish to call me for today's visit updates.  6. Follow up NP visit in 1-2 months.  I spent50 minutes providing this consultation, from 9amto 9:1227m. More than 50% of the time in this consultation was spent coordinating communication.   CODE STATUS: DNR/DNI. Most form on chart.  PPS: 30%.   HOSPICE ELIGIBILITY/DIAGNOSIS: Not currently.  PAST MEDICAL HISTORY:  Past Medical History:  Diagnosis Date  . Allergy   . Chronic kidney disease   . CVA (cerebral vascular accident) ([name])   . Depression   . Dysphagia   . GERD (gastroesophageal reflux disease)   . Huntington chorea [facility]([name])    [facility] Dr Boris SharperFrancis Walker [phone] [name]  . Hyperlipidemia   . Hypertension   . Vitamin D deficiency     SOCIAL HX:  Social History   Tobacco Use  . Smoking status: Never Smoker  . Smokeless tobacco: Never Used  .  Tobacco comment: Single, separated from wife 2003. Lives with friend of family. Moved to [name] area from [place] spring 2010 to be closer to [facility] and caregiver  Substance Use Topics  . Alcohol use: No    ALLERGIES:  Allergies  Allergen Reactions  . Floxin [Ofloxacin]   . Codeine Hives and Rash     PERTINENT MEDICATIONS:  Outpatient Encounter Medications as of 12/01/2018  Medication Sig  . acetaminophen (TYLENOL) 325 MG tablet Take 650 mg by  mouth every 6 (six) hours as needed.  Marland Kitchen apixaban (ELIQUIS) 5 MG TABS tablet Take 5 mg by mouth 2 (two) times daily.  Marland Kitchen atorvastatin (LIPITOR) 20 MG tablet Take 1 tablet (20 mg total) by mouth daily at 6 PM.  . tetrabenazine (XENAZINE) 12.5 MG tablet TAKE 1 TABLET BY MOUTH TWICE DAILY  . tetrabenazine (XENAZINE) 25 MG tablet Take 1 tablet (25 mg total) by mouth 2 (two) times daily.  . Vitamin D, Ergocalciferol, (DRISDOL) 50000 units CAPS capsule Take 50,000 Units by mouth every 7 (seven) days.   No facility-administered encounter medications on file as of 12/01/2018.     PHYSICAL EXAM:  VS: BP 100/60, HR 64, RR 12 General: slender AA male sitting up in recliner wheelchair, with Choreiform movements. Aide is feeding him his very large breakfast. He is alert and engaged; very pleasant. Cardiovascular: regular rate and rhythm Pulmonary: clear ant fields Abdomen: soft, nontender, + bowel sounds GU: no suprapubic tenderness Extremities: Right  wrist and fingers contractured to 90 degrees. no edema, no joint deformities Skin: no rashes  [name], NP

## 2018-12-01 ENCOUNTER — Non-Acute Institutional Stay: Payer: Medicare Other | Admitting: Internal Medicine

## 2018-12-01 ENCOUNTER — Encounter: Payer: Self-pay | Admitting: Internal Medicine

## 2018-12-01 VITALS — BP 100/60 | HR 64 | Ht 67.0 in | Wt 140.6 lb

## 2018-12-01 DIAGNOSIS — Z515 Encounter for palliative care: Secondary | ICD-10-CM

## 2018-12-02 ENCOUNTER — Ambulatory Visit: Payer: Self-pay | Admitting: Neurology

## 2018-12-30 ENCOUNTER — Ambulatory Visit: Payer: Self-pay | Admitting: Neurology

## 2019-02-14 ENCOUNTER — Other Ambulatory Visit: Payer: Self-pay

## 2019-02-14 ENCOUNTER — Non-Acute Institutional Stay: Payer: Medicare Other | Admitting: Internal Medicine

## 2019-02-14 ENCOUNTER — Encounter: Payer: Self-pay | Admitting: Internal Medicine

## 2019-02-14 VITALS — BP 106/66 | HR 72 | Resp 12 | Ht 67.0 in | Wt 137.2 lb

## 2019-02-14 DIAGNOSIS — Z515 Encounter for palliative care: Secondary | ICD-10-CM

## 2019-02-14 NOTE — Progress Notes (Signed)
[facility] Telephone: [phone] Fax: [phone]  PATIENT NAME:Eric Waters. DOB:1951-03-04 TMB:[id number] [name]  PRIMARY CARE PROVIDER:Dr. Beryle Beams Lupita Shutter FNP-C [facility]  REFERRING PROVIDER: Renford Dills, MD [address] [place], [place] [address]  Followed by Dr. Arbutus Leas (neurology).  RESPONSIBLE PARTY:[name] McNeil [phone] of first cousin) HCPOA  RECOMMENDATIONS and PLAN: 1.Huntington's chorea: Patient'sspeech continues difficult to understand, but he cananswers questions with one or two word responses, and can follow simple commands. He appears alert and oriented x3. He continues to tolerate his Xenazine12.5 bid without decrease of BP below 100 systolic and without excessive daytime somnolence or lethargy. He has occasional falls out of his wheelchair 2/2 choreiform movements. He has a f/u visit with neurology (Dr.[name]) in April. -Continue suporative care.  2.Weight Loss: Patient's oral intake is 100% of a double portions pureed diet, with thickened liquids. He needs to be fed. Staff report only very occasional clearing cough when eating. His current weight is down to 137.2 lbs, which is a weight loss of 48.8 lbs over the last year. At a height of 5'7", his current BMI is 21.5 kg/m2.   3. Decreased mobility: Patient withover all choreiform movements which staff report are more pronounced in the evenings.He is wheelchair bound. He is a heavy 2 person heavy transfer, and is unable to weight bear due to LE spasticity and R LE weakness due to an old stroke. He has right wrist, hand and finger contractures.   4. Advanced Care Planning: DNR and MOST forms on chart. MOST: details: limited scope of treatment, IVFs/antibiotics if indicated, and no feeding tube.  5. Goals of Care: In past telephone conversations,  HCPOA Tori Milks  expressed wish for  referral to hospice care and services if patient were to meet eligibility criteria. We had discussed that were patient to go to hospice services, that patient's Huntington's medications would not likely be covered.  6. Follow up NP visit in 1-2 months. Discussed today's visit with RP Fraser Din. 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   I spent60 minutes providing this consultation, from3pmto47m. More than 50% of the time in this consultation was spent coordinating communication, interviewing staff and family, reconciling facility MAR with [facility] EMR, and charting..   INTERVAL HISTORY / HISTORY OF PRESENT ILLNESS:Eric Watersis a 68 y.o.malewith  medical h/o problems  Huntington's chorea, CKD III, h/o CVA with expressive aphasia(2015 eliquis / statins), PVD, and depression, who has had ongoing weight loss. This is a routine Palliative Care follow up from 12/01/2018.    CODE STATUS:(02/09/2018) DNR/DNI. Most form on chart.  PPS:30%.   HOSPICE ELIGIBILITY/DIAGNOSIS:Not currently.  PAST MEDICAL HISTORY:  Past Medical History:  Diagnosis Date  . Allergy   . Chronic kidney disease   . CVA (cerebral vascular accident) ([name])   . Depression   . Dysphagia   . GERD (gastroesophageal reflux disease)   . Huntington chorea [facility])    previously followed at [facility]- Dr Boris Sharper [phone] [name])  . Hyperlipidemia   . Hypertension   . Vitamin D deficiency     SOCIAL HX:  Social History   Tobacco Use  . Smoking status: Never Smoker  . Smokeless tobacco: Never Used  . Tobacco comment: Single, separated from wife 2003. Lives with friend of family. Moved to [name] area from [place] spring 2010 to be closer to  [facility] and caregiver  Substance Use Topics  . Alcohol use: No    ALLERGIES:  Allergies  Allergen Reactions  . Floxin [Ofloxacin]   . Codeine Hives and Rash      PERTINENT MEDICATIONS:  Outpatient Encounter Medications as of 02/14/2019  Medication Sig  . acetaminophen (TYLENOL) 325 MG tablet Take 650 mg by mouth every 6 (six) hours as needed.  Marland Kitchen apixaban (ELIQUIS) 5 MG TABS tablet Take 5 mg by mouth 2 (two) times daily.  Marland Kitchen atorvastatin (LIPITOR) 20 MG tablet Take 1 tablet (20 mg total) by mouth daily at 6 PM.  . Melatonin 3 MG TABS Take 3 mg by mouth at bedtime.  Marland Kitchen tetrabenazine (XENAZINE) 12.5 MG tablet TAKE 1 TABLET BY MOUTH TWICE DAILY  . tetrabenazine (XENAZINE) 25 MG tablet Take 1 tablet (25 mg total) by mouth 2 (two) times daily. (Patient taking differently: Take 12.5 mg by mouth 2 (two) times daily. )  . Vitamin D, Ergocalciferol, (DRISDOL) 50000 units CAPS capsule Take 50,000 Units by mouth every 30 (thirty) days.    No facility-administered encounter medications on file as of 02/14/2019.     PHYSICAL EXAM:   VS: BP 106/66, HR 72, RR 12 General:slender AA male lying on his left side in the bed. Choreiform movements, but able to accomplish some tasks such as adjusting his pillow. He is alert and engaged; very pleasant. Cardiovascular: regular rate and rhythm Pulmonary: clear ant fields Abdomen: soft, nontender, + bowel sounds Extremities:Right wrist and fingers contractured to 90 degrees. no edema, no joint deformities Skin: no rashes  [name], NP

## 2019-03-03 ENCOUNTER — Ambulatory Visit: Payer: Self-pay | Admitting: Neurology

## 2019-03-03 ENCOUNTER — Encounter

## 2019-03-08 ENCOUNTER — Ambulatory Visit: Payer: Self-pay | Admitting: Neurology

## 2019-03-14 ENCOUNTER — Telehealth: Payer: Self-pay | Admitting: Neurology

## 2019-03-14 NOTE — Telephone Encounter (Signed)
I will contact the SNF to let them know about ordering the medication.

## 2019-03-14 NOTE — Telephone Encounter (Signed)
Pt on schedule for evisit but isn't an appropriate candidate for evisit given no capacity to give consent.  Apparently nurse from SNF called to make appt.  I can't unfortunately do him via evisit.  Annabelle Harman, please let SNF know.  In investigating this I did review chart and reason why not on austedo.  Apparently they cannot take RX from me for a SNF and so [facility] dropped the authorization.  Morrie Sheldon investigated cost but they could only give her out of pocket cost of $5500 but that is without insurance.  The SNF itself will need to order the med and see if insurance will pay for this as I cannot do it apparently.  I have copied Morrie Sheldon on this to relate to SNF nursing since he cannot do evisit and Annabelle Harman to r/s appointment.  Annabelle Harman, see me before r/s appointment

## 2019-03-15 ENCOUNTER — Telehealth: Payer: Self-pay | Admitting: Neurology

## 2019-03-27 ENCOUNTER — Telehealth: Payer: Self-pay | Admitting: Neurology

## 2019-03-27 NOTE — Telephone Encounter (Signed)
1 have called and left several messages for the nursing home to call us back to resch patients appt. I have called on 03-16-19 03-23-19 and 03-24-19  And 03-27-19

## 2019-04-03 ENCOUNTER — Telehealth: Payer: Self-pay | Admitting: Neurology

## 2019-04-03 NOTE — Telephone Encounter (Signed)
We have addressed this with them many times.  When we increased it, he was really somnolent (and he was) and they requested we decreased the dose.  Then, I tried to change med to austedo but turns out, because he is in a nursing home it has to be initiated by the nursing home and not me so it got dropped.  It needs to be addressed by the SNF physician.  I cannot help with this part Morrie Sheldon, I believe that you had discussed with them last).

## 2019-04-03 NOTE — Telephone Encounter (Signed)
Tia from [facility] called in about this patient stating that he is on the lowest dose of the xenazine medication at 12.5mg  x2 a day and that he is harming himself with his movements. She is wanting to know what she can do to help him or if she needs to increase the medication. Please call her back at [phone]. Thanks!

## 2019-04-04 NOTE — Telephone Encounter (Signed)
Left message for Tia to call me back.

## 2019-04-05 NOTE — Telephone Encounter (Signed)
I spoke with Tia again and she wanted to know how we titrate him off the other medication.  She will try to get the Austedo process started.

## 2019-04-05 NOTE — Telephone Encounter (Signed)
There was an emergency msg left with after hours about this patient and the Tetrabenazine medication. They are wanting an emergency refill. May want to try Tia again as this is the pharmacy now calling in. [facility]. They left a phone number of [phone] and hit option 3 followed by option 2. I also stuck the msg in mailbox so you would have it if you needed it.Thanks!

## 2019-04-05 NOTE — Telephone Encounter (Signed)
I just spoke with Tia (NP at facility).  She said that patient's movements are worsening and he is falling a lot.  She would like to increase his Xenazine back to 25 mg bid.  I explained to her that the other medication they were trying to get for the patient could not be ordered by Dr. Arbutus Leas and it was extremely expensive.  Please advise.

## 2019-04-05 NOTE — Telephone Encounter (Signed)
Wait until they get the austedo to address.  If they get the austedo, there is a direct conversion from one to other.  They probably need to call their austedo rep since I was unable to do it (they have different reps for facilities apparently)

## 2019-04-05 NOTE — Telephone Encounter (Signed)
Called Tia back and gave her instructions per Dr. Arbutus Leas.  She said that his PCP will be placing the order.

## 2019-04-05 NOTE — Telephone Encounter (Signed)
Our records indicate that at 25 mg bid he was still falling out of chair and still somewhat lethargic.  Is there a reason that the NP won't try to start the process for austedo since I can't do that because he is in a facility?

## 2019-04-06 ENCOUNTER — Other Ambulatory Visit: Payer: Self-pay | Admitting: Neurology

## 2019-08-30 ENCOUNTER — Other Ambulatory Visit: Payer: Self-pay | Admitting: Neurology

## 2019-08-30 NOTE — Telephone Encounter (Signed)
Requested Prescriptions   Pending Prescriptions Disp Refills  . tetrabenazine (XENAZINE) 12.5 MG tablet [Pharmacy Med Name: TETRABENAZINE 12.5MG ] 180 tablet 0    Sig: TAKE 1 TABLET BY MOUTH TWICE DAILY   Rx last filled: 04/07/19 #180 0 refills  Pt last seen: 08/11/18  Follow up appt scheduled:09/11/19  Denied Patient will need to wait until appt for refills

## 2019-09-07 NOTE — Progress Notes (Unsigned)
Virtual Visit via Video Note The purpose of this virtual visit is to provide medical care while limiting exposure to the novel coronavirus.    Consent was obtained for video visit:  {yes no:314532} Answered questions that patient had about telehealth interaction:  {yes no:314532} I discussed the limitations, risks, security and privacy concerns of performing an evaluation and management service by telemedicine. I also discussed with the patient that there may be a patient responsible charge related to this service. The patient expressed understanding and agreed to proceed.  Pt location: Home Physician Location: office Name of referring provider:  [name], MD I connected with Eric Waters. at patients initiation/request on 09/11/2019 at  8:15 AM EDT by video enabled telemedicine application and verified that I am speaking with the correct person using two identifiers. 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  I have not seen him for over a year, mostly because of Covid and the lockdown of nursing facilities and physician offices.  When I saw him last time, his Marzella Schlein was making him fatigued and the plan was to decrease it to 12.5 mg twice per day until we were able to get him on Austedo.  However, we learned that the nursing facility needed to apply for the Austedo, as there are different representatives from the company for facilities versus outpatients.  The nursing facility did call me in December just to state that they could not get the patient to the appointment that he had that day with me, but were complaining that the patient was jerking a lot and wanted to know what to do.  The nursing facility had not proceeded with trying to get him on the Austedo.  The company ([employer]) had stated that they had dropped the authorization because the nursing  facility itself would need to order the medication.  The nurse practitioner ultimately did call here and wanted to know if she could go back up on the [place].  I recommended that she try to get authorization for the Austedo.   Current Outpatient Medications on File Prior to Visit  Medication Sig Dispense Refill  . acetaminophen (TYLENOL) 500 MG tablet Take 500 mg by mouth at bedtime. And q 24 hr prn    . apixaban (ELIQUIS) 5 MG TABS tablet Take 5 mg by mouth 2 (two) times daily.    Marland Kitchen atorvastatin (LIPITOR) 20 MG tablet Take 1 tablet (20 mg total) by mouth daily at 6 PM. 30 tablet 0  . Melatonin 3 MG TABS Take 3 mg by mouth at bedtime.    Marland Kitchen tetrabenazine (XENAZINE) 12.5 MG tablet TAKE 1 TABLET BY MOUTH TWICE DAILY 180 tablet 0  . Vitamin D, Ergocalciferol, (DRISDOL) 50000 units CAPS capsule Take 50,000 Units by mouth every 30 (thirty) days.      No current facility-administered medications on file prior to visit.      Observations/Objective:   There were no vitals filed for this visit. GEN:  The patient appears stated age and is in NAD.  Neurological examination:  Orientation: The patient is alert and answers simple questions of orientation. Cranial nerves: There is good facial symmetry. There is ***facial hypomimia.  The speech is very limited.  When he does speak, he is dysarthric.Marland Kitchen Soft palate rises symmetrically and there is no tongue deviation. Hearing is intact to conversational tone. Motor: Right  hand is held in the fist with the thumb between the pointer and middle finger.  Strength in the right upper and left upper extremity are at least antigravity.  He does not participate with manual muscle testing.  Movement examination: Tone: unable Abnormal movements: *** Coordination:  There is *** decremation with RAM's, *** Gait and Station: The patient does not ambulate.    Assessment and Plan:     Follow Up Instructions:    -I discussed the assessment and treatment plan with  the patient. The patient was provided an opportunity to ask questions and all were answered. The patient agreed with the plan and demonstrated an understanding of the instructions.   The patient was advised to call back or seek an in-person evaluation if the symptoms worsen or if the condition fails to improve as anticipated.    Total Time spent in visit with the patient was:  ***, of which more than 50% of the time was spent in counseling and/or coordinating care on ***.   Pt understands and agrees with the plan of care outlined.     Kerin Salen, DO

## 2019-09-11 ENCOUNTER — Other Ambulatory Visit: Payer: Self-pay

## 2019-09-11 ENCOUNTER — Telehealth: Payer: Medicare Other | Admitting: Neurology

## 2019-09-12 ENCOUNTER — Telehealth: Payer: Self-pay | Admitting: Neurology

## 2019-09-12 NOTE — Telephone Encounter (Signed)
Pt had appt yesterday (Monday).  Multiple calls Friday to facility.  On one occasion, nurse answered and said she would call back to give [place] information to call on Monday for VV.  She didn't.  We called multiple other times and either no one answered or no one could help.  Called at appt time multiple times yesterday and no one answered.

## 2019-10-16 ENCOUNTER — Encounter: Payer: Self-pay | Admitting: Internal Medicine

## 2019-10-16 ENCOUNTER — Non-Acute Institutional Stay: Payer: Medicare Other | Admitting: Internal Medicine

## 2019-10-16 ENCOUNTER — Other Ambulatory Visit: Payer: Self-pay

## 2019-10-16 DIAGNOSIS — G10 Huntington's disease: Secondary | ICD-10-CM

## 2019-10-16 DIAGNOSIS — Z515 Encounter for palliative care: Secondary | ICD-10-CM

## 2019-10-16 NOTE — Progress Notes (Signed)
November 23rd, 2020 [facility] Telephone: [phone] Fax: [phone]  Due to the current COVID-19 infection/crises, the patient and family prefer, and have given their verbal consent for, a provider visit via telehealth, from my office. HIPPA policies of confidentially were discussed.  PATIENT NAME: Eric Waters. DOB: 05-23-1951 MRN: [id number] [facility]   PRIMARY CARE PROVIDER: Dr. Helmut Muster, Isaias Cowman FNP-C [place] PROVIDER:   [name], MD [address] [place], [place] [address]    Followed by Dr. Carles Collet (neurology).    RESPONSIBLE PARTY:   Vivia Budge - [phone]  [phone] (husband of first cousin) HCPOA   RECOMMENDATIONS and PLAN:  1.Advanced Care Planning:  A.Directives: DNR and MOST forms on chart. MOST: details: limited scope of treatment, IVFs/antibiotics if indicated, and no feeding tube.   B. Goals of Care: In past telephone conversations,  HCPOA Vivia Budge expressed wish for  referral to hospice care and services if patient were to meet eligibility criteria. We had discussed that were patient to go to hospice services, that patient's Huntington's medications would not likely be covered.  2. Huntington's chorea: I have a little difficulty in understanding patient's speech. He maintains eye contact and is engaging. He answers questions appropriately in short sentences and can follow simple commands. Patient denies pain; answers "no" when asked if he has any problems or concerns. Dr. Carles Collet (neurology) has been attempting to visit patient via tele-health but has been unsuccessful (09/12/2019). He was started on Austedo, with gradual dose increase to current 12mg  bid.   -I'll alert SW department that Dr. Carles Collet has been trying to see patient, so that SW can assist with facilitating.   3. Weight Loss: Patient's oral intake is 100% of a double portions pureed diet, with thickened  liquids. He needs to be fed. His current weight is 132.2lbs, which is a loss of 5 lbs over the last 8 months, and 53.8 lbs over the last 20 months. At a height of 5'7", his current BMI is 20.7 kg/m2.    4. Decreased mobility: Continues over all choreiform movements. He is wheelchair bound. He is a heavy 2 person heavy transfer, and is unable to weight bear due to LE spasticity and R LE weakness due to an old stroke. He has right wrist, hand and finger contractures.     5. Follow up NP visit in 1-2 months. I'll call patient's RP Jaci Standard, tomorrow. 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    I spent 30 minutes providing this consultation, from 12:30 pm to 1pm. More than 50% of the time in this consultation was spent coordinating communication, interviewing staff and family, reconciling facility MAR with [facility] EMR, and charting..    INTERVAL HISTORY / HISTORY OF PRESENT ILLNESS:  Eric Waters. is a 68 y.o.  male with  medical h/o problems  Huntington's chorea, CKD III, h/o CVA with expressive aphasia (2015 eliquis / statins), PVD, and depression, who has had ongoing weight loss. This is a routine Palliative Care follow up from 02/14/2019.    CODE STATUS: (02/09/2018) DNR/DNI. Most form on chart.   PPS: 30%.    HOSPICE ELIGIBILITY/DIAGNOSIS: Not currently.  PAST MEDICAL HISTORY:  Past Medical History:  Diagnosis Date  . Allergy   . Chronic kidney disease   . CVA (cerebral vascular accident) ([place])   .  Depression   . Dysphagia   . GERD (gastroesophageal reflux disease)   . Huntington chorea [facility])    previously followed at [facility]- Dr Boris Sharper [phone] [name])  . Hyperlipidemia   . Hypertension   . Vitamin D deficiency     SOCIAL HX:  Social History   Tobacco Use  . Smoking status: Never Smoker  . Smokeless tobacco: Never Used  . Tobacco comment: Single, separated from wife  2003. Lives with friend of family. Moved to [name] area from [place] spring 2010 to be closer to [name] and caregiver  Substance Use Topics  . Alcohol use: No    ALLERGIES:  Allergies  Allergen Reactions  . Floxin [Ofloxacin]   . Codeine Hives and Rash     PERTINENT MEDICATIONS:  Outpatient Encounter Medications as of 10/16/2019  Medication Sig  . acetaminophen (TYLENOL) 500 MG tablet Take 500 mg by mouth at bedtime. And q 24 hr prn  . aspirin EC 81 MG tablet Take 81 mg by mouth daily.  Marland Kitchen atorvastatin (LIPITOR) 20 MG tablet Take 1 tablet (20 mg total) by mouth daily at 6 PM.  . barrier cream (NON-SPECIFIED) CREA Apply 1 application topically 2 (two) times daily. Apply to buttocks for redness on buttocks  . Deutetrabenazine (AUSTEDO) 12 MG TABS Take 12 mg by mouth 2 (two) times daily.  . Melatonin 5 MG TABS Take 5 mg by mouth at bedtime.   . sertraline (ZOLOFT) 50 MG tablet Take 50 mg by mouth daily.  . Vitamin D, Ergocalciferol, (DRISDOL) 50000 units CAPS capsule Take 50,000 Units by mouth every 30 (thirty) days.   . [DISCONTINUED] Deutetrabenazine (AUSTEDO) 9 MG TABS Take 9 mg by mouth 2 (two) times daily.  . [DISCONTINUED] apixaban (ELIQUIS) 5 MG TABS tablet Take 5 mg by mouth 2 (two) times daily.  . [DISCONTINUED] tetrabenazine (XENAZINE) 12.5 MG tablet TAKE 1 TABLET BY MOUTH TWICE DAILY   No facility-administered encounter medications on file as of 10/16/2019.     PHYSICAL EXAM:   Older middle-aged AA male sitting up in his wheelchair. He is alert and very pleasant, which is his baseline.    Choreiform movements.  PE deferred d/t tele-health nature of visit Extremities: Right  wrist and fingers contractured to 90 degrees. no edema, no joint deformities Skin: no rashes exposed areas  [name], NP

## 2019-10-17 ENCOUNTER — Encounter: Payer: Self-pay | Admitting: Internal Medicine

## 2019-11-10 ENCOUNTER — Encounter

## 2020-06-25 NOTE — Progress Notes (Deleted)
° °  Assessment/Plan:   ***1.  Huntington's disease  -increase austedo, 12mg , 2 in the AM and 1 in the PM.  Subjective:   . was seen today in follow up for Huntington's disease.  My previous records as well as any outside records available were reviewed prior to todays visit.  Pt is currently on Austedo, 12 mg twice per day.  This has been started since our last visit.  Have not seen the patient since 2019.  At that point in time, we were trying to transition him to [facility].  He was delayed in getting back here, somewhat because of the pandemic, and some because he was scheduled for video visits and the nursing facility did not answer the call when he was scheduled for the appointment.  Fortunately, he was able to get transition to the Austedo.  I have been able to review his records that are available to me.  However, last records available to me were from November, 2020, which were palliative care records.  Patient continues with oral intake.  He does have pured diet with thickened liquids.  Patient does not ambulate.   PREVIOUS MEDICATIONS: {Parkinson's RX:18200}  CURRENT MEDICATIONS:  Outpatient Encounter Medications as of 06/27/2020  Medication Sig   acetaminophen (TYLENOL) 500 MG tablet Take 500 mg by mouth at bedtime. And q 24 hr prn   aspirin EC 81 MG tablet Take 81 mg by mouth daily.   atorvastatin (LIPITOR) 20 MG tablet Take 1 tablet (20 mg total) by mouth daily at 6 PM.   barrier cream (NON-SPECIFIED) CREA Apply 1 application topically 2 (two) times daily. Apply to buttocks for redness on buttocks   Deutetrabenazine (AUSTEDO) 12 MG TABS Take 12 mg by mouth 2 (two) times daily.   Melatonin 5 MG TABS Take 5 mg by mouth at bedtime.    sertraline (ZOLOFT) 50 MG tablet Take 50 mg by mouth daily.   Vitamin D, Ergocalciferol, (DRISDOL) 50000 units CAPS capsule Take 50,000 Units by mouth every 30 (thirty) days.    No facility-administered encounter medications on file  as of 06/27/2020.     Objective:   PHYSICAL EXAMINATION:    VITALS:  There were no vitals filed for this visit.  GEN:  The patient appears stated age and is in NAD. HEENT:  Normocephalic, atraumatic.  The mucous membranes are moist. The superficial temporal arteries are without ropiness or tenderness. CV:  RRR Lungs:  CTAB Neck/HEME:  There are no carotid bruits bilaterally.  Neurological examination:  Orientation: The patient is alert and oriented x3. Cranial nerves: There is good facial symmetry.The speech is fluent and clear. Soft palate rises symmetrically and there is no tongue deviation. Hearing is intact to conversational tone. Sensation: Sensation is intact to light touch throughout Motor: Strength is at least antigravity x4.  Movement examination: Tone: There is normal tone in the UE/LE Abnormal movements:  no tremor.  No myoclonus.  No asterixis.   Coordination:  There is no decremation with RAM's. Gait and Station: The patient has no difficulty arising out of a deep-seated chair without the use of the hands. The patient's stride length is good.      ***Total time spent on today's visit was *** minutes, including both face-to-face time and nonface-to-face time.  Time included that spent on review of records (prior notes available to me/labs/imaging if pertinent), discussing treatment and goals, answering patient's questions and coordinating care.  Cc:  08/27/2020, MD

## 2020-06-27 ENCOUNTER — Telehealth: Payer: Self-pay | Admitting: Neurology

## 2020-06-27 ENCOUNTER — Ambulatory Visit: Payer: Medicare Other | Admitting: Neurology

## 2020-06-27 ENCOUNTER — Encounter: Payer: Self-pay | Admitting: Neurology

## 2020-06-27 NOTE — Telephone Encounter (Signed)
SNF scheduled appt for today and did not bring pt to appt.

## 2020-07-01 ENCOUNTER — Telehealth: Payer: Self-pay | Admitting: Neurology

## 2020-07-01 NOTE — Telephone Encounter (Signed)
Patient dismissed from [facility] by Dr. Lurena Joiner [name] , effective August 5,2021. Dismissal letter sent out by 1st class mail 8/9/21fbg

## 2020-08-01 ENCOUNTER — Non-Acute Institutional Stay: Payer: Medicare Other | Admitting: Nurse Practitioner

## 2020-08-01 DIAGNOSIS — Z515 Encounter for palliative care: Secondary | ICD-10-CM

## 2020-08-01 DIAGNOSIS — R634 Abnormal weight loss: Secondary | ICD-10-CM

## 2020-08-01 NOTE — Progress Notes (Signed)
[facility] Consult Note Telephone: [phone]  Fax: [phone]  PATIENT NAME: Eric Waters. [address] [place] [address] [phone] (home)  DOB: [birth year] MRN: [id number]  PRIMARY CARE PROVIDER:    [name], MD,  [address] [place] [address] [phone]  REFERRING PROVIDER:   [name], MD [address] [place],  [place] [address] [phone]  RESPONSIBLE PARTY:   Extended Emergency Contact Information Primary Emergency Contact: McNeil,[name] Address: [place] Phone: [phone] Relation: Relative  I met face to face with patient in facility.  ASSESSMENT AND RECOMMENDATIONS:   1. Advance Care Planning/Goals of Care:  Advanced Care Planning:  A.Directives: DNRand MOST forms on file. MOST: details:limited scope of treatment, IVFs/antibiotics if indicated, and no feeding tube.             B. Goals of Care:In past telephone conversations. Eric Waters who is paient'sHCPOA had expressedwishforreferral to hospice care services if patient were to meet eligibility criteria. He was made aware that if patient is to go on hospice services,  patient's Huntington's medications would not likely be covered. Patient was however dismissed from Neurology service on 06/27/2020 due to several no shows for his appointment. Will discuss patient's continued decline in condition with RP and revisit hospice appropriateness.  2.Symptom management: Huntington's disease: Patient has ongoing movement disorder and speech difficulty from the disease. Patient was unable to articulate any words during exam today, he became agitated with frustration evidenced by increased choreiform movements.Patient continues on Austedo 47m BID for symptom control. Facility may have problem refilling this medication as patient was  [facility]. Patient has missed several appointments with his neurologist. Recommend social work reach out to Neurology to establish care, if patient RP does not permit hospice care at this point, as ARenee Harderis not on Hospice formulary.  Patient continues to have weight loss, weight 108.6lbs down from 132.2 at last palliative care visit on 10/16/2019. Staff report patient fair oral intake. He is unable to feed self. Patient uncontrolable spastic movements affects his feeding.  3. Follow up Palliative Care Visit: Palliative care will continue to follow for goals of care clarification and symptom management. Return 4 weeks or prn.  4. Cognitive / Functional decline: Patient continues to be wheelchair bound related to severe over all choreiform movements.  He is two person assist for transfers, and is unable to weight bear due to LE spasticity and R LE weakness due to an old stroke. He has right wrist, hand and finger contractures. Cognition is difficult to determine as patient maintains good eye contact and appeared to understand questions.  I spent 30 minutes providing this consultation, time includes time spent with patient, chart review, and documentation. More than 50% of the time in this consultation was spent coordinating communication.   HISTORY OF PRESENT ILLNESS:  Eric Waters is a 69y.o. year old male with multiple medical problems including Huntington's chorea, CKD III, h/o CVA with expressive aphasia(2015 eliquis / statins), PVD, and depression, who has hadongoingweight loss.  Palliative Care was asked to follow this patient by consultation request of PSeward Carol MD to help address advance care planning and goals of care. This is a follow up visit, last visit was on 10/16/2019.  CODE STATUS: DNR  PPS: 30%  HOSPICE ELIGIBILITY/DIAGNOSIS: TBD  PAST  MEDICAL HISTORY:  Past Medical History:  Diagnosis Date   Allergy    Chronic kidney  disease    CVA (cerebral vascular accident) ([place])    Depression    Dysphagia    GERD (gastroesophageal reflux disease)    Huntington chorea ([place])    previously followed at [facility]- Dr Evelena Leyden [phone] Altha Harm)   Hyperlipidemia    Hypertension    Vitamin D deficiency     SOCIAL HX:  Social History   Tobacco Use   Smoking status: Never Smoker   Smokeless tobacco: Never Used   Tobacco comment: Single, separated from wife 2003. Lives with friend of family. Moved to [place] area from [place] spring 2010 to be closer to [facility] and caregiver  Substance Use Topics   Alcohol use: No   FAMILY HX:  Family History  Problem Relation Age of Onset   Cancer Father        Lung   Huntington's disease Maternal Grandmother    Huntington's disease Mother    Alzheimer's disease Maternal Uncle    Stroke Maternal Uncle        Alzheimer   Alzheimer's disease Maternal Grandfather     ALLERGIES:  Allergies  Allergen Reactions   Floxin [Ofloxacin]    Codeine Hives and Rash     PERTINENT MEDICATIONS:  Outpatient Encounter Medications as of 08/01/2020  Medication Sig   acetaminophen (TYLENOL) 500 MG tablet Take 500 mg by mouth at bedtime. And q 24 hr prn   aspirin EC 81 MG tablet Take 81 mg by mouth daily.   atorvastatin (LIPITOR) 20 MG tablet Take 1 tablet (20 mg total) by mouth daily at 6 PM.   barrier cream (NON-SPECIFIED) CREA Apply 1 application topically 2 (two) times daily. Apply to buttocks for redness on buttocks   Deutetrabenazine (AUSTEDO) 12 MG TABS Take 12 mg by mouth 2 (two) times daily.   Melatonin 5 MG TABS Take 5 mg by mouth at bedtime.    sertraline (ZOLOFT) 50 MG tablet Take 50 mg by mouth daily.   Vitamin D, Ergocalciferol, (DRISDOL) 50000 units CAPS capsule Take 50,000 Units by mouth every 30 (thirty) days.    No facility-administered encounter medications on file as of 08/01/2020.    PHYSICAL EXAM / ROS:   Current and past weights: 108.6lbs down  from 132.2 in Nov 2020. BMI 17kg/m2 General: NAD, frail appearing, thin Cardiovascular: no chest pain reported, no edema  Pulmonary: no cough, no increased SOB, room air Abdomen: appetite fair, incontinent of bowel GU:  incontinent of urine MSK:  Non-ambulatory,  Skin: scratch marks on bilateral lower extrimities Neurological: Spastic movements  Jari Favre, DNP, AGPCNP-BC

## 2020-08-02 ENCOUNTER — Other Ambulatory Visit: Payer: Self-pay

## 2020-10-14 ENCOUNTER — Non-Acute Institutional Stay: Payer: Medicare Other | Admitting: Nurse Practitioner

## 2020-10-14 ENCOUNTER — Other Ambulatory Visit: Payer: Self-pay

## 2020-10-14 DIAGNOSIS — G10 Huntington's disease: Secondary | ICD-10-CM

## 2020-10-14 DIAGNOSIS — Z515 Encounter for palliative care: Secondary | ICD-10-CM

## 2020-10-14 NOTE — Progress Notes (Signed)
[facility] Consult Note Telephone: [phone]  Fax: [phone]  PATIENT NAME: Eric Waters. [address] [place] [address] [phone] (home)  DOB: 1951-06-13 MRN: [id number]  PRIMARY CARE PROVIDER:    [name], MD,  [place] [address] [place] [address] [address] [phone]  REFERRING PROVIDER:   [name], MD [address] [place] [address] [place],  [place] [address] [phone]  RESPONSIBLE PARTY:   Extended Emergency Contact Information Primary Emergency Contact: McNeil,[name] Address: [place] Phone: [phone] Relation: Relative  I met face to face with patient in facility, patient unable to substantively engage in process due to poor cognition.   ASSESSMENT AND RECOMMENDATIONS:   Advance Care Planning: (Unable to reach family to review advance care planning).   DNRand MOST forms on file and uploaded to [facility] EMR. MOST form details include;limited scope of treatment, IVFs/antibiotics if indicated, and no feeding tube.  Symptom Management:  Weight loss: Patient with ongoing weight loss, had 3lbs weight loss in the last 2 months, weight 105.6lbs down from 108.6lbs at last palliative care visit 2 months ago.Patient with dysphagia on pureed diet with nectar thick liquid. Staff report fair appetite, patient on nutritional supplement Med Pass 172m three tiimes a day, tolerating supplement without report of GI issues.   Recommend continuing current plan of care with nutritional supplement and assistance with feeding during meals. May consider low dose Remeron if poor oral intake due to poor appetite.   Follow up Palliative Care Visit: Palliative care will continue to follow for goals of care clarification and symptom management. Return in about 6-8 weeks or prn.  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 He is a two person assist for transfers, unable to weight bear due to LE spasticity and R LE weakness due to an old stroke. He has right wrist, hand and finger contractures. He is total assist with all ADLs and non verbal.  I spent 48 minutes providing this consultation, time includes time spent with patient, chart review, provider coordination, and documentation. More than 50% of the time in this consultation was spent coordinating communication.   CHIEF COMPLAINT: Follow up palliative care visit  HISTORY OF PRESENT ILLNESS:  Eric Waters is a 69y.o. year old male with multiple medical problems including Huntington's disease, CKD III, h/o CVA with expressive aphasia, PVD, and depression, who has hadongoingweight loss.    CODE STATUS: DNR  PPS: 30%  HOSPICE ELIGIBILITY/DIAGNOSIS: TBD  PHYSICAL EXAM / ROS:   Current and past weights: 105.6lbs down from 108.6 at last palliative care visit 2 months ago General: NAD, frail appearing, thin, lying in bed in his room awake and alert Cardiovascular:  no edema  Pulmonary: no cough, no increased SOB, room air GI:  appetite fair, no report of constipation, incontinent of bowel GU:  incontinent of urine MSK:  non ambulatory Skin: scratch marks on bilateral lower extremeties Neurological: Spastic movements noted  PAST MEDICAL HISTORY:  Past Medical History:  Diagnosis Date   Allergy    Chronic kidney disease    CVA (cerebral vascular accident) ([facility]    Depression    Dysphagia    GERD (gastroesophageal reflux disease)    Huntington chorea ([facility]    [facility]  Gil[facility] Rile [phone] Altha Harm)   Hyperlipidemia    Hypertension    Vitamin D deficiency     SOCIAL HX:  Social History   Tobacco Use   Smoking status: Never Smoker   Smokeless tobacco: Never Used   Tobacco comment: Single,  separated from wife 2003. Lives with friend of family. Moved to [place] area from [place] spring 2010 to be closer to [facility] and caregiver  Substance Use Topics   Alcohol use: No   FAMILY HX:  Family History  Problem Relation Age of Onset   Cancer Father        Lung   Huntington's disease Maternal Grandmother    Huntington's disease Mother    Alzheimer's disease Maternal Uncle    Stroke Maternal Uncle        Alzheimer   Alzheimer's disease Maternal Grandfather     ALLERGIES:  Allergies  Allergen Reactions   Floxin [Ofloxacin]    Codeine Hives and Rash     PERTINENT MEDICATIONS:  Outpatient Encounter Medications as of 10/14/2020  Medication Sig   acetaminophen (TYLENOL) 500 MG tablet Take 500 mg by mouth at bedtime. And q 24 hr prn   aspirin EC 81 MG tablet Take 81 mg by mouth daily.   atorvastatin (LIPITOR) 20 MG tablet Take 1 tablet (20 mg total) by mouth daily at 6 PM.   barrier cream (NON-SPECIFIED) CREA Apply 1 application topically 2 (two) times daily. Apply to buttocks for redness on buttocks   Deutetrabenazine (AUSTEDO) 12 MG TABS Take 12 mg by mouth 2 (two) times daily.   Melatonin 5 MG TABS Take 5 mg by mouth at bedtime.    sertraline (ZOLOFT) 50 MG tablet Take 50 mg by mouth daily.   Vitamin D, Ergocalciferol, (DRISDOL) 50000 units CAPS capsule Take 50,000 Units by mouth every 30 (thirty) days.    No facility-administered encounter medications on file as of 10/14/2020.     Jari Favre, DNP, AGPCNP-BC

## 2020-12-26 ENCOUNTER — Other Ambulatory Visit: Payer: Self-pay

## 2020-12-26 ENCOUNTER — Non-Acute Institutional Stay: Payer: Medicare Other | Admitting: Nurse Practitioner

## 2020-12-26 DIAGNOSIS — Z515 Encounter for palliative care: Secondary | ICD-10-CM

## 2020-12-26 DIAGNOSIS — R634 Abnormal weight loss: Secondary | ICD-10-CM

## 2020-12-26 NOTE — Progress Notes (Signed)
[facility] Consult Note Telephone: [phone]  Fax: [phone]  PATIENT NAME: Eric Waters. [address] [place] [address] [phone] (home)  DOB: 03/08/1951 MRN: [id number]  PRIMARY CARE PROVIDER:    [name], MD,  [place] [address] [place] [address] [place] [address] [phone]  REFERRING PROVIDER:   [name], MD [address] [place] [address] [place],  [place] [address] [phone]  RESPONSIBLE PARTY:   Extended Emergency Contact Information Primary Emergency Contact: McNeil,[name] Address: [place] Phone: [phone] Relation: Relative  I met face to face with patient in facility.   ASSESSMENT AND RECOMMENDATIONS:   Advance Care Planning: Directives: Signed DNRand MOST forms onfile in facility and on [facility] EMR. MOST form details include;limited additional intervention, antibiotics if indicated, IV fluids if indicated, no feeding tube. No blood transfusion.  Cognitive / Functional decline/ Symptom Management:  Patient continues to be wheelchair bound related to severe choreiform movements secondary to progression in Huntington disease.He is total assist with all ADLs including feeding. He is twoperson assist fortransfers, unable to weight bear weight on his legs due to spasticity and lower extrimity weakness due to an old stroke. He has right wrist, hand and finger contractures. He is non verbal. Weight loss:  BMI of 16.1. Weight not improving even with patient receiving Medpass 187m TID and on Remeron 131m Unable to reach his POA at this time to review goals of care given patient's failure to thrive condition. Will attempt to reach again.    Follow up Palliative Care Visit: Palliative care will continue to follow for goals of care clarification and symptom management. Return in about 4-6 weeks or prn.  Family  /Caregiver/Community Supports: Patient lives in skilled nursing facility His Nephew is his POA.  I spent 30  minutes providing this consultation. More than 50% of the time in this consultation was spent counseling and coordinating communication.   CHIEF COMPLAINT: Weakness  History obtained from review of EMR and discussion with facility staff. Records reviewed and summarized bellow.  HISTORY OF PRESENT ILLNESS:Eric Watersis a [age].o.year old malewith multiple medical problems including Huntington's disease, CKD III, h/o CVA with expressive aphasia, PVD, and depression. Patient with ongoing weight loss and failure to thrive.Palliative care was to help with symptoms management, advance care planning and complex decision making. This is a follow up visit.  CODE STATUS: DNR  PPS: 30%  HOSPICE ELIGIBILITY/DIAGNOSIS: TBD  PHYSICAL EXAM / ROS:   Current and past weights: 105.2lbs, Ht 68f42f, BMI 16.1kg/m2 General: chronically ill and frail appearing, thin, lying in bed in his room Cardiovascular:  no edema  Pulmonary: no cough, no increased SOB, room air GI:  appetite fair, no report of constipation, incontinent of bowel GU:  incontinent of urine MSK:  non ambulatory Skin: no report of wounds Neurological: Spastic movements noted, non-verbal Psych: non-anxious affect  PAST MEDICAL HISTORY:  Past Medical History:  Diagnosis Date  . Allergy   . Chronic kidney disease   . [facility]re[facility] accident) ([facility] . Depression   . Dysphagia   . GERD (gastroesophageal reflux disease)   . [facility] Fra[facility]velena [phone] Harm. Hyperlipidemia   . Hypertension   . Vitamin D deficiency     SOCIAL HX:  Social History   Tobacco  Use  . Smoking status: Never Smoker  . Smokeless tobacco: Never Used  . Tobacco comment: Single, separated from wife 2003. Lives with friend of family. Moved to [name] area from [place] spring 2010  to be closer to [facility] and caregiver  Substance Use Topics  . Alcohol use: No   FAMILY HX:  Family History  Problem Relation Age of Onset  . Cancer Father        Lung  . Huntington's disease Maternal Grandmother   . Huntington's disease Mother   . Alzheimer's disease Maternal Uncle   . Stroke Maternal Uncle        Alzheimer  . Alzheimer's disease Maternal Grandfather     ALLERGIES:  Allergies  Allergen Reactions  . Floxin [Ofloxacin]   . Codeine Hives and Rash     PERTINENT MEDICATIONS:  Outpatient Encounter Medications as of 12/26/2020  Medication Sig  . acetaminophen (TYLENOL) 500 MG tablet Take 500 mg by mouth at bedtime. And q 24 hr prn  . aspirin EC 81 MG tablet Take 81 mg by mouth daily.  . atorvastatin (LIPITOR) 20 MG tablet Take 1 tablet (20 mg total) by mouth daily at 6 PM.  . barrier cream (NON-SPECIFIED) CREA Apply 1 application topically 2 (two) times daily. Apply to buttocks for redness on buttocks  . Deutetrabenazine (AUSTEDO) 12 MG TABS Take 12 mg by mouth 2 (two) times daily.  . Melatonin 5 MG TABS Take 5 mg by mouth at bedtime.   . sertraline (ZOLOFT) 50 MG tablet Take 50 mg by mouth daily.  . Vitamin D, Ergocalciferol, (DRISDOL) 50000 units CAPS capsule Take 50,000 Units by mouth every 30 (thirty) days.    No facility-administered encounter medications on file as of 12/26/2020.    Thank you for the opportunity to participate in the care of Mr. Eric Waters. The palliative care team will continue to follow. Please call our office at [phone] if we can be of additional assistance.   C , NP , DNP, AGPCNP-BC   

## 2020-12-27 NOTE — Addendum Note (Signed)
Addended by: Ronnell Freshwater on: 12/27/2020 05:22 PM   Modules accepted: Level of Service

## 2021-07-04 ENCOUNTER — Other Ambulatory Visit: Payer: Self-pay

## 2021-07-04 ENCOUNTER — Non-Acute Institutional Stay: Payer: Medicare Other

## 2021-07-04 VITALS — BP 122/60 | HR 52 | Temp 97.4°F | Resp 20

## 2021-07-04 DIAGNOSIS — Z515 Encounter for palliative care: Secondary | ICD-10-CM

## 2021-07-04 NOTE — Progress Notes (Addendum)
PATIENT NAME: Eric Waters. DOB: 1951-11-06 MRN: [id number]  PRIMARY CARE PROVIDER: Renford Dills, MD  RESPONSIBLE PARTY:  Acct ID - Guarantor Home Phone Work Phone Relationship Acct Type  [id number] [name] [phone]  Self P/F     [address] [place], [place] [address]    PLAN OF CARE and INTERVENTIONS:               1.  GOALS OF CARE/ ADVANCE CARE PLANNING:  DNR with form on chart.  MOST form completed  with limited interventions, IV fluids if indicated, antibiotics if indicated, no blood transfusion, and no tube feeding.               2. PERSONAL EMERGENCY PLAN:  Activate 911 for emergencies.               3.  DISEASE STATUS:  Patient found in the common area sitting in a gerri-chair.  He is alert and able to respond to yes/no questions.  He is agreeable to return to his room for assessment.  Patient taken back to his room.  Denies any pain.  Patient requires total assistance with bed mobility and transfers.  Contractures present to bilateral hands.  Hand brace being used periodically. Staff is feeding him and po intake averages 90-100% on average.  Staff report patient is now eating regular foods.  No swallowing issues are noted.   Patient is incontinent of bowel and bladder.   Staff report patient has not had any recent falls.  No new concerns voiced by staff.  1013 am.  Phone call made to [facility] to follow up on any new concerns for patient.  No answer but message has been left requesting a call back.  1058 am.  Incoming call from [facility].  He currently has no care issues but would would like patient moved to [place] where he has family that would better assist with his care and could visit him more often.  Fraser Din has contacted an agency to see if they could assist but he reports this process has taken weeks to months now.  I have requested the information for the agency and Fraser Din will forward that to me.  Any assistance he can receive would be helpful.   Advised that I would also consult with our social workers to see if additional assistance can be offered.  [facility] voiced appreciation and continued updates with any new developments on possibly moving patient.    HISTORY OF PRESENT ILLNESS:  70 year old male with Huntington's Disease.  Patient is being followed by Palliative Care every 8-12 weeks and PRN.  CODE STATUS: DNR form on chart. ADVANCED DIRECTIVES: No MOST FORM: Yes PPS: 30%   PHYSICAL EXAM:   VITALS: Today's Vitals   07/04/21 0822  BP: 122/60  Pulse: (!) 52  Resp: 20  Temp: (!) 97.4 F (36.3 C)  SpO2: 95%  PainSc: 0-No pain    LUNGS: clear to auscultation  CARDIAC: Cor RRR}  EXTREMITIES: - for edema SKIN: Skin color, texture, turgor normal. No rashes or lesions or normal  NEURO: positive for coordination problems and gait problems       Truitt Merle, RN

## 2021-09-10 ENCOUNTER — Non-Acute Institutional Stay: Payer: Medicare Other

## 2021-09-10 VITALS — BP 100/60 | HR 78 | Resp 18

## 2021-09-10 DIAGNOSIS — Z515 Encounter for palliative care: Secondary | ICD-10-CM

## 2021-09-11 ENCOUNTER — Other Ambulatory Visit: Payer: Self-pay

## 2021-09-11 NOTE — Progress Notes (Signed)
PATIENT NAME: Eric Waters. DOB: 24-Feb-1951 MRN: [id number]  PRIMARY CARE PROVIDER: [name], MD  RESPONSIBLE PARTY:  Acct ID - Guarantor Home Phone Work Phone Relationship Acct Type  [id number] Eric Waters, Eric Waters [phone]  Self P/F     [address] [place], [place] [address]  COMMUNITY PALLIATIVE CARE RN NOTE    PLAN OF CARE and INTERVENTION:  ADVANCE CARE PLANNING/GOALS OF CARE: Comfort PATIENT/CAREGIVER EDUCATION: Education regarding Palliative care services, safety. DISEASE STATUS:  RN Palliative care visit completed today. Met with patient Eric Waters in his room. Patient is out of bed to [facility] chair. Patient awake but remained nonverbal throughout the visit. He remains dependent on facility staff for ADLS, incontinent of bowel and bladder. Meal intake ranges between 75-100%. Staff reported no swallowing concerns.  Spoke with Eric Marker LPN at facility. No concerns voiced at this time.    HISTORY OF PRESENT ILLNESS: This is a 70 year old male with diagnoses including but not limited to Huntington's disease, CVA and CKD. Palliative care will continue to follow for additional support, goals of care and complex decision making.  CODE STATUS: DNR in place MOST FORM: yes PPS: 30%    PHYSICAL EXAM:    LUNGS: clear to auscultation  CARDIAC: Cor RRR}  EXTREMITIES: Negative for Edema SKIN: Skin color, texture, turgor normal. No rashes or lesions  NEURO: positive for weakness       Cornelius Moras, RN

## 2021-12-24 ENCOUNTER — Other Ambulatory Visit: Payer: Self-pay

## 2021-12-24 ENCOUNTER — Non-Acute Institutional Stay: Payer: Commercial Managed Care - HMO

## 2021-12-24 DIAGNOSIS — Z515 Encounter for palliative care: Secondary | ICD-10-CM

## 2021-12-27 NOTE — Progress Notes (Signed)
PATIENT NAME: Eric Waters. DOB: 1951/03/04 MRN: [id number]  PRIMARY CARE PROVIDER: Renford Dills, MD  RESPONSIBLE PARTY:  Acct ID - Guarantor Home Phone Work Phone Relationship Acct Type  [id number] Eric Waters, Eric Waters [phone]  Self P/F     [address] [place], [place] [address]   PLAN OF CARE and INTERVENTION:  ADVANCE CARE PLANNING/GOALS OF CARE: Comfort, safety CAREGIVER EDUCATION: Palliative care, safety DISEASE STATUS: RN Palliative care visit completed at [facility]. Patient out of bed to recliner chair. Observed patient being fed lunch. Patient coughing at times. PO intake between 90-100%. Contractures to hands bilaterally. Dependent on facility caregivers to complete ADLS. Patient more agitated today and would not allow for assessment.  HISTORY OF PRESENT ILLNESS: This is a 71 -year-old male with Huntington's Disease. Palliative care has been asked to follow for additional support, care and complex decision making.   CODE STATUS: DNR, MOST form in place PPS: 30% Physical Exam: Deferred       Duration: 30 min       Beryle Beams RN, BSN   Estanislado Pandy, RN

## 2022-03-06 ENCOUNTER — Non-Acute Institutional Stay: Payer: Commercial Managed Care - HMO | Admitting: Family Medicine

## 2022-03-06 ENCOUNTER — Encounter: Payer: Self-pay | Admitting: Family Medicine

## 2022-03-06 VITALS — Resp 20

## 2022-03-06 DIAGNOSIS — Z515 Encounter for palliative care: Secondary | ICD-10-CM

## 2022-03-06 DIAGNOSIS — R634 Abnormal weight loss: Secondary | ICD-10-CM

## 2022-03-06 NOTE — Progress Notes (Signed)
? ? ?Manufacturing engineer ?[facility] Consult Note ?Telephone: [phone]  ?Fax: [phone]  ? ? ?Date of encounter: 03/06/22 ?10:42 PM ?PATIENT NAME: Eric Waters. ?[address] ?[place] [address]   ?[phone] (home)  ?DOB: 16-Oct-1951 ?MRN: [id number] ?PRIMARY CARE PROVIDER:    ?[name], MD,  ?[address] ?[place] [address] ?[phone] ? ?REFERRING PROVIDER:   ?[name], MD ?[address] ?[address] ?[place],   [address] ?[phone] ? ?RESPONSIBLE PARTY:    ?Contact Information   ? ? Name Relation Home Work Mobile  ? [place] Relative [phone]    ? ?  ? ? ? ?I met face to face with patient in skilled nursing facility. Palliative Care was asked to follow this patient by consultation request of  [name], MD to address advance care planning and complex medical decision making. This is a follow up visit. ? ?ASSESSMENT , SYMPTOM MANAGEMENT AND PLAN / RECOMMENDATIONS:  ? Palliative Care Encounter ?Change position frequently and allow pt to lie down to offload pressure over sacrum and bony prominences. ?Use barrier protective cream on buttocks ? ?2.  Weight loss ?Suspect weight loss.  Supplement with nutritional supplement BID to TID.  Obtain updated weight on patient. ? ?Advance Care Planning/Goals of Care: Goals include to maximize quality of life and symptom management.  ?CODE STATUS: ?MOST as of 02/09/2018: ?DNR/DNI with limited additional interventions ?No blood transfusions ?Antibiotics and IV fluids if indicated ?No feeding tube ? ? ? ?Follow up Palliative Care Visit: Palliative care will continue to follow for complex medical decision making, advance care planning, and clarification of goals. Return 4-6 weeks or prn. ? ? ?This visit was coded based on medical decision making (MDM). ? ?PPS: 40% ? ?HOSPICE ELIGIBILITY/DIAGNOSIS: TBD ? ?Chief Complaint:  ?Palliative Care is following patient for chronic management of  hx of stroke in background of Huntington's chorea. ? ?HISTORY OF PRESENT ILLNESS:  Eric Waters. is a 71 y.o. year old male with Huntington's chorea, hx of stroke, CKD stage 3, aphasia, GERD, sialadenitis, and allergic rhinitis.  Pt is largely non-verbal, making gutteral sounds in response to questions.  He is wheelchair or bedbound and total care, incontinent of bowel and bladder but continues to eat well per facility staff. On attempting to examine or question patient he begins to grunt and scream but no intelligible speech determined. ? ?History obtained from review of EMR, discussion with facility staff and/or Mr. Hyson.  ?I reviewed available labs, medications, imaging, studies and related documents from the EMR.  Records reviewed and summarized above.  ? ?ROS ?Unable to provide due to aphasia ? ?Physical Exam: ?Current and past weights: Last weight listed was 137 lbs 3.2 ounces on 02/14/2019 (will try to follow up with facility for updated weight) ?Constitutional: NAD  ?General: frail appearing, thin ?EYES: anicteric sclera, lids intact, no discharge  ?ENMT: intact hearing, oral mucous membranes moist, dentition intact ?CV: S1S2, RRR, no LE edema ?Pulmonary: CTAB, no increased work of breathing, no cough, room air ?Abdomen: normo-active BS + 4 quadrants, soft and non tender, no ascites ?GU: deferred ?MSK: noted generalized sarcopenia, bilateral hand contractures ?Skin: warm and dry, no rashes or wounds on visible skin  ?Neuro:  noted increased spasticity of BUE ?Psych: mildly anxious affect unable to assess orientation with non-verbal pt ?Hem/lymph/immuno: no widespread bruising ? ? ?Thank you for the opportunity to participate in the care of Eric Waters.  The palliative care team will continue  to follow. Please call our office at [phone] if we can be of additional assistance.  ? ?Marijo Conception, FNP -C ? ?COVID-19 PATIENT SCREENING TOOL ?Asked and negative response unless otherwise noted:  ? ?Have you  had symptoms of covid, tested positive or been in contact with someone with symptoms/positive test in the past 5-10 days?  No ? ?

## 2022-03-08 DIAGNOSIS — R634 Abnormal weight loss: Secondary | ICD-10-CM | POA: Insufficient documentation

## 2022-03-08 DIAGNOSIS — Z515 Encounter for palliative care: Secondary | ICD-10-CM | POA: Insufficient documentation

## 2022-12-28 ENCOUNTER — Non-Acute Institutional Stay: Payer: 59 | Admitting: Family Medicine

## 2022-12-28 ENCOUNTER — Encounter: Payer: Self-pay | Admitting: Family Medicine

## 2022-12-28 ENCOUNTER — Telehealth: Payer: Self-pay | Admitting: Family Medicine

## 2022-12-28 VITALS — BP 128/78 | HR 82 | Temp 98.0°F | Resp 16

## 2022-12-28 DIAGNOSIS — E46 Unspecified protein-calorie malnutrition: Secondary | ICD-10-CM | POA: Insufficient documentation

## 2022-12-28 NOTE — Telephone Encounter (Signed)
TCT Kinnie Scales, pt's emergency contact to follow up on today's visit.  Left vm requesting call back at his convenience.  Damaris Hippo FNP-C

## 2022-12-28 NOTE — Progress Notes (Signed)
[facility] Consult Note Telephone: [phone]  Fax: [phone]    Date of encounter: 03/06/22 10:42 PM PATIENT NAME: Eric Waters. [address] [place] [address]   [phone] (home)  DOB: Feb 03, 1951 MRN: [id number] PRIMARY CARE PROVIDER:    [name], MD,  [place] [address] [place] [address] [phone]  REFERRING PROVIDER:   [name], MD [address] [place],  [place] [address] [phone]  RESPONSIBLE PARTY:    Contact Information     Name Relation Home Work [place] Relative [phone]          I met face to face with patient in skilled nursing facility. Palliative Care was asked to follow this patient by consultation request of  [name], MD to address advance care planning and complex medical decision making. This is a follow up visit.  ASSESSMENT , SYMPTOM MANAGEMENT AND PLAN / RECOMMENDATIONS:   Unspecified protein calorie malnutrition Muscle and subcutaneous fat wasting in all 4 extremities. Last weight listed as 161 lbs on 12/03/22 with prior weight from 02/14/19 of 137 lbs 3.2 oz.   Question if 161 lbs was weight with WC Encourage intake of concentrated protein sources like prostat, ensure pudding, magic cup that pt could swallow without significant difficulty. Continue pressure reduction surfaces in wc and bed to allow repositioning to offload pressure to bony prominences. Would encourage intake of medicine with nutritional supplements to boost calorie intake.   Advance Care Planning/Goals of Care: Goals include to maximize quality of life and symptom management.  CODE STATUS: MOST as of 02/09/2018: DNR/DNI with limited additional interventions No blood transfusions Antibiotics and IV fluids if indicated No feeding tube    Follow up Palliative Care Visit: Palliative care will continue to follow for complex medical decision  making, advance care planning, and clarification of goals. Return 4-6 weeks or prn.   This visit was coded based on medical decision making (MDM).  PPS: 30%  HOSPICE ELIGIBILITY/DIAGNOSIS: TBD  Chief Complaint:  Palliative Care is continuing to follow patient for chronic medical management in setting of huntington's chorea in hx of stroke.  HISTORY OF PRESENT ILLNESS:  Eric Waters. is a 72 y.o. year old male with Huntington's chorea, hx of stroke, CKD stage 3, aphasia, GERD, sialadenitis, and allergic rhinitis.   He is wheelchair or bedbound and total care, incontinent of bowel and bladder. Ate 50% of lunch today.  He makes guttural sounds but no intelligible speech.  History obtained from review of EMR, discussion with facility staff and/or Eric Waters.  I reviewed available labs, medications, imaging, studies and related documents from the EMR.  There are no new records in Epic.   ROS Unable to provide due to aphasia  Physical Exam: Current and past weights: Last weight listed was 137 lbs 3.2 ounces on 02/14/2019 (reported weight 12/03/22 was 161 lbs-question accuracy or if includes weight of wc) Constitutional: NAD  General: frail appearing, thin CV: S1S2, RRR, no LE edema Pulmonary: CTAB, no increased work of breathing, no cough, room air Abdomen: normo-active BS + 4 quadrants, soft and non tender GU: deferred MSK: noted generalized sarcopenia, right hand/wrist contractures. Skin: warm and dry, no rashes or wounds on visible skin  Neuro:  aphasic, non-verbal, alert Psych: unable to assess orientation with non-verbal pt, calm Hem/lymph/immuno: no widespread bruising   Thank you for the opportunity to participate in the care of Eric Waters.  The palliative  care team will continue to follow. Please call our office at [phone] if we can be of additional assistance.   Marijo Conception, FNP -C  COVID-19 PATIENT SCREENING TOOL Asked and negative response unless otherwise noted:    Have you had symptoms of covid, tested positive or been in contact with someone with symptoms/positive test in the past 5-10 days?   Unknown

## 2023-03-26 ENCOUNTER — Encounter: Payer: Self-pay | Admitting: Family Medicine

## 2023-03-26 ENCOUNTER — Non-Acute Institutional Stay: Payer: Medicare Other | Admitting: Family Medicine

## 2023-03-26 VITALS — HR 56 | Temp 98.1°F | Resp 20

## 2023-03-26 DIAGNOSIS — E43 Unspecified severe protein-calorie malnutrition: Secondary | ICD-10-CM

## 2023-03-26 DIAGNOSIS — G10 Huntington's disease: Secondary | ICD-10-CM

## 2023-03-26 NOTE — Progress Notes (Unsigned)
Therapist, nutritional Palliative Care Consult Note Telephone: [phone]  Fax: [phone]   Date of encounter: 03/26/23 12:46 PM PATIENT NAME: Eric Waters. [address] [place] [address]   [phone] (home)  DOB: November 25, 1950 MRN: [id number] PRIMARY CARE PROVIDER:    Renford Dills, MD,  [address] [place] [address] [phone]  REFERRING PROVIDER:   Renford Dills, MD [address] [place],  [place] [address] [phone]  Emergency Contact:    Contact Information     Name Relation Home Work [place] Relative [phone]         Health Care POA-[name] [facility]   I met face to face with patient in [facility] facility. Palliative Care was asked to follow this patient by consultation request of Renford Dills, MD to address advance care planning and complex medical decision making. This is a follow up visit.    CODE STATUS: MOST as of 02/09/18: DNR/DNI with limited additional intervention No blood transfusions Use of antibiotics and IV fluids if indicated No feeding tube.   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 Recommend continued offering of favorite foods-tastes and textures to encourage pt to eat Encourage medpass use to help with getting some extra calories/nutrition. Consider appetite stimulant    Follow up Palliative Care Visit:  Palliative Care continuing to follow up by monitoring for changes in appetite, weight, functional and cognitive status for chronic disease progression and management  in agreement with patient's stated goals of care. Next visit in 4 weeks or prn.  This visit was coded based on medical decision making (MDM).  Chief Complaint  Palliative Care is continuing to follow patient for chronic medical management in setting of huntington's chorea in hx of stroke.   HISTORY OF PRESENT ILLNESS: Eric Waters is a 72 y.o. year old male with Huntington's chorea and hx of CVA. Pt offers no attempt at verbal speech or response.  It is difficult to determine how much he understands.  He is seen sitting in his wc in the dining area watching TV.     ACTIVITIES OF DAILY LIVING: CONTINENT OF BLADDER/BOWEL? No BATHING/DRESSING/FEEDING: Requires assistance  MOBILITY:   WHEELCHAIR/BEDBOUND, Requires lift for transfer  APPETITE? Good WEIGHT-no recent weight  CURRENT PROBLEM LIST:  Patient Active Problem List   Diagnosis Date Noted   Protein-calorie malnutrition ([name]) 12/28/2022   Palliative care encounter 03/08/2022   Weight loss 03/08/2022   CVA (cerebral infarction) 11/16/2014   Aphasia 11/16/2014   CKD (chronic kidney disease), stage III ([name]) 04/10/2013   Liver cyst 04/09/2013   Major depressive disorder, recurrent ([name]) 05/09/2012   INGROWING NAIL 09/15/2010   HUNTINGTONS CHOREA 12/06/2009   ALLERGIC RHINITIS 12/06/2009   SIALADENITIS 12/06/2009   GERD 12/06/2009   DIARRHEA 12/06/2009   PAST MEDICAL HISTORY:  Active Ambulatory Problems    Diagnosis Date Noted   HUNTINGTONS CHOREA 12/06/2009   ALLERGIC RHINITIS 12/06/2009   SIALADENITIS 12/06/2009   GERD 12/06/2009   INGROWING NAIL 09/15/2010   DIARRHEA 12/06/2009   Major depressive disorder, recurrent ([name]) 05/09/2012   Liver cyst 04/09/2013   CKD (chronic kidney disease), stage III (  [name]) 04/10/2013   CVA (cerebral infarction) 11/16/2014   Aphasia 11/16/2014   Palliative care encounter 03/08/2022   Weight loss 03/08/2022   Protein-calorie malnutrition ([name]) 12/28/2022   Resolved Ambulatory  Problems    Diagnosis Date Noted   Acute renal failure ([name]) 04/09/2013   Past Medical History:  Diagnosis Date   Allergy    Chronic kidney disease    CVA (cerebral vascular accident) ([name])    Depression    Dysphagia    GERD (gastroesophageal reflux disease)    Huntington chorea ([name])    Hyperlipidemia    Hypertension    Vitamin D deficiency    SOCIAL HX:  Social History   Tobacco Use   Smoking status: Never   Smokeless tobacco: Never   Tobacco comments:    Single, separated from wife 2003. Lives with friend of family. Moved to [name] area from [place] spring 2010 to be closer to [facility] and caregiver  Substance Use Topics   Alcohol use: No   FAMILY HX:  Family History  Problem Relation Age of Onset   Cancer Father        Lung   Huntington's disease Maternal Grandmother    Huntington's disease Mother    Alzheimer's disease Maternal Uncle    Stroke Maternal Uncle        Alzheimer   Alzheimer's disease Maternal Grandfather        Preferred Pharmacy: ALLERGIES:  Allergies  Allergen Reactions   Floxin [Ofloxacin]    Codeine Hives and Rash     PERTINENT MEDICATIONS:  Outpatient Encounter Medications as of 03/26/2023  Medication Sig   acetaminophen (TYLENOL) 500 MG tablet Take 500 mg by mouth at bedtime. And q 24 hr prn   aspirin EC 81 MG tablet Take 81 mg by mouth daily.   atorvastatin (LIPITOR) 20 MG tablet Take 1 tablet (20 mg total) by mouth daily at 6 PM.   barrier cream (NON-SPECIFIED) CREA Apply 1 application topically 2 (two) times daily. Apply to buttocks for redness on buttocks   Deutetrabenazine (AUSTEDO) 12 MG TABS Take 12 mg by mouth 2 (two) times daily.   Melatonin 5 MG TABS Take 5 mg by mouth at bedtime.    sertraline (ZOLOFT) 50 MG tablet Take 50 mg by mouth daily.   Vitamin D, Ergocalciferol, (DRISDOL) 50000 units CAPS capsule Take 50,000 Units by mouth every 30 (thirty) days.    No facility-administered encounter medications on file as of 03/26/2023.     History obtained from review of EMR, discussion with primary team, and interview with family, facility staff/caregiver and/or patient.     I reviewed available labs, medications, imaging, studies and related documents from the EMR.  There were no new records/imaging since last visit. Records reviewed and summarized above.   Physical Exam: GENERAL: NAD LUNGS: CTAB, no increased work of breathing, room air CARDIAC:  S1S2, RRR with no MRG, No edema/ cyanosis ABD:  Normo-active BS x 4 quads, soft, non-tender EXTREMITIES: Bilat hand contractures with significant loss of muscle subcutaneous fat loss NEURO:  Generalized weakness with significant cognitive impairment, aphasia-expressive/receptive  PSYCH:  non-anxious affect, A & O at least to name  Thank you for the opportunity to participate in the care ofname@. Please call our main office at [phone] if we can be of additional assistance.    Joycelyn Man FNP-C  [email or web address] .[facility]  Phone:  [phone]

## 2024-04-08 ENCOUNTER — Other Ambulatory Visit: Payer: Self-pay

## 2024-04-08 ENCOUNTER — Emergency Department (HOSPITAL_COMMUNITY)
Admission: EM | Admit: 2024-04-08 | Discharge: 2024-04-08 | Disposition: A | Discharge status: Home / Self Care | Attending: Emergency Medicine | Admitting: Emergency Medicine

## 2024-04-08 ENCOUNTER — Encounter (HOSPITAL_COMMUNITY): Payer: Self-pay | Admitting: Emergency Medicine

## 2024-04-08 ENCOUNTER — Emergency Department (HOSPITAL_COMMUNITY)

## 2024-04-08 DIAGNOSIS — R001 Bradycardia, unspecified: Secondary | ICD-10-CM | POA: Insufficient documentation

## 2024-04-08 DIAGNOSIS — S0091XA Abrasion of unspecified part of head, initial encounter: Secondary | ICD-10-CM | POA: Diagnosis not present

## 2024-04-08 DIAGNOSIS — W19XXXA Unspecified fall, initial encounter: Secondary | ICD-10-CM

## 2024-04-08 DIAGNOSIS — S0990XA Unspecified injury of head, initial encounter: Secondary | ICD-10-CM | POA: Diagnosis present

## 2024-04-08 DIAGNOSIS — W06XXXA Fall from bed, initial encounter: Secondary | ICD-10-CM | POA: Insufficient documentation

## 2024-04-08 NOTE — ED Triage Notes (Signed)
 Pt BIB EMS from [name] for a unwitnessed fall around 0120. Found him on the floor next to his bed. Snf wants him checked out for abrasions on his head. No thinners.

## 2024-04-08 NOTE — ED Notes (Signed)
[facility] CALLED  °

## 2024-04-08 NOTE — ED Notes (Signed)
 Report given to Darlene, RN with Les[facility] Ras arrangement made.

## 2024-04-08 NOTE — ED Provider Notes (Signed)
[facility] Emergency Department Provider Note MRN:  [id number]  Arrival date & time: 04/08/24     Chief Complaint   Fall   History of Present Illness   Eric Waters. is a 73 y.o. year-old male presents to the ED with chief complaint of fall from bed.  Patient is from [facility] skilled nursing facility.  Nursing facility noted some minor abrasions on his head and wanted him to be evaluated.  He is DNR and has a MOST form at the bedside.  He has history of Huntington's with aphasia.  He is unable to contribute to his history.     Review of Systems  Pertinent positive and negative review of systems noted in HPI.    Physical Exam   Vitals:   04/08/24 0500 04/08/24 0515  BP: 110/78 99/68  Pulse: (!) 49 (!) 50  Resp:    Temp:    SpO2: 97% 98%    CONSTITUTIONAL:  chronically ill and frail-appearing, NAD NEURO:  Awake EYES:  eyes equal and reactive ENT/NECK:  Supple, no stridor  CARDIO:  bradycardic, regular rhythm, appears well-perfused  PULM:  No respiratory distress, CTAB GI/GU:  non-distended,  MSK/SPINE:  No gross deformities, no edema, extremities in contracture SKIN:  no rash, atraumatic   *Additional and/or pertinent findings included in MDM below  Diagnostic and Interventional Summary    EKG Interpretation Date/Time:    Ventricular Rate:    PR Interval:    QRS Duration:    QT Interval:    QTC Calculation:   R Axis:      Text Interpretation:         Labs Reviewed - No data to display  CT HEAD WO CONTRAST ( )  Final Result    CT Cervical Spine Wo Contrast  Final Result      Medications - No data to display   Procedures  /  Critical Care Procedures  ED Course and Medical Decision Making  I have reviewed the triage vital signs, the nursing notes, and pertinent available records from the EMR.  Social Determinants Affecting Complexity of Care: Patient has no clinically significant social determinants affecting this  chief complaint..   ED Course:    Medical Decision Making Patient here after a fall out of bed.  I do not note any significant traumatic injuries.  CT head and cervical spine are negative for acute traumatic injuries.  Patient appears stable for discharge back to his facility.  Amount and/or Complexity of Data Reviewed Radiology: ordered.         Consultants: No consultations were needed in caring for this patient.   Treatment and Plan: Emergency department workup does not suggest an emergent condition requiring admission or immediate intervention beyond  what has been performed at this time. The patient is safe for discharge and has  been instructed to return immediately for worsening symptoms, change in  symptoms or any other concerns    Final Clinical Impressions(s) / ED Diagnoses     ICD-10-CM   1. Fall, initial encounter  [email or web address]       ED Discharge Orders     None         Discharge Instructions Discussed with and Provided to Patient:   Discharge Instructions      You scans were without evidence of acute traumatic injuries.  Return for new or worsening symptoms.       Sherel Dikes, PA-C 04/08/24 0865    Edson Graces, MD 04/08/24  0708  

## 2024-04-08 NOTE — Discharge Instructions (Signed)
 You scans were without evidence of acute traumatic injuries.  Return for new or worsening symptoms.

## 2024-07-09 ENCOUNTER — Emergency Department (HOSPITAL_COMMUNITY)
Admission: EM | Admit: 2024-07-09 | Discharge: 2024-07-10 | Disposition: A | Discharge status: Home / Self Care | Attending: Emergency Medicine | Admitting: Emergency Medicine

## 2024-07-09 ENCOUNTER — Emergency Department (HOSPITAL_COMMUNITY)

## 2024-07-09 ENCOUNTER — Other Ambulatory Visit: Payer: Self-pay

## 2024-07-09 DIAGNOSIS — Z7982 Long term (current) use of aspirin: Secondary | ICD-10-CM | POA: Diagnosis not present

## 2024-07-09 DIAGNOSIS — S00502A Unspecified superficial injury of oral cavity, initial encounter: Secondary | ICD-10-CM | POA: Insufficient documentation

## 2024-07-09 DIAGNOSIS — S00531A Contusion of lip, initial encounter: Secondary | ICD-10-CM | POA: Diagnosis not present

## 2024-07-09 DIAGNOSIS — W19XXXA Unspecified fall, initial encounter: Secondary | ICD-10-CM | POA: Diagnosis not present

## 2024-07-09 DIAGNOSIS — S0990XA Unspecified injury of head, initial encounter: Secondary | ICD-10-CM | POA: Diagnosis not present

## 2024-07-09 DIAGNOSIS — G9389 Other specified disorders of brain: Secondary | ICD-10-CM | POA: Diagnosis not present

## 2024-07-09 DIAGNOSIS — N189 Chronic kidney disease, unspecified: Secondary | ICD-10-CM | POA: Diagnosis not present

## 2024-07-09 DIAGNOSIS — S0993XA Unspecified injury of face, initial encounter: Secondary | ICD-10-CM

## 2024-07-09 DIAGNOSIS — Y92129 Unspecified place in nursing home as the place of occurrence of the external cause: Secondary | ICD-10-CM | POA: Insufficient documentation

## 2024-07-09 LAB — BASIC METABOLIC PANEL WITH GFR
Anion gap: 10 (ref 5–15)
BUN: 20 mg/dL (ref 8–23)
CO2: 27 mmol/L (ref 22–32)
Calcium: 9 mg/dL (ref 8.9–10.3)
Chloride: 103 mmol/L (ref 98–111)
Creatinine, Ser: 1.03 mg/dL (ref 0.61–1.24)
GFR, Estimated: >60 mL/min (ref >60)
Glucose, Bld: 138 mg/dL — ABNORMAL HIGH (ref 70–99)
Potassium: 4.8 mmol/L (ref 3.5–5.1)
Sodium: 140 mmol/L (ref 135–145)

## 2024-07-09 LAB — CBC WITH DIFFERENTIAL/PLATELET
Abs Immature Granulocytes: 0.03 K/uL (ref 0.00–0.07)
Basophils Absolute: 0 K/uL (ref 0.0–0.1)
Basophils Relative: 0 %
Eosinophils Absolute: 0.1 K/uL (ref 0.0–0.5)
Eosinophils Relative: 1 %
HCT: 39.4 % (ref 39.0–52.0)
Hemoglobin: 12.5 g/dL — ABNORMAL LOW (ref 13.0–17.0)
Immature Granulocytes: 0 %
Lymphocytes Relative: 20 %
Lymphs Abs: 1.6 K/uL (ref 0.7–4.0)
MCH: 29.3 pg (ref 26.0–34.0)
MCHC: 31.7 g/dL (ref 30.0–36.0)
MCV: 92.5 fL (ref 80.0–100.0)
Monocytes Absolute: 0.6 K/uL (ref 0.1–1.0)
Monocytes Relative: 8 %
Neutro Abs: 5.4 K/uL (ref 1.7–7.7)
Neutrophils Relative %: 71 %
Platelets: 226 K/uL (ref 150–400)
RBC: 4.26 MIL/uL (ref 4.22–5.81)
RDW: 14.2 % (ref 11.5–15.5)
WBC: 7.7 K/uL (ref 4.0–10.5)
nRBC: 0 % (ref 0.0–0.2)

## 2024-07-09 LAB — URINALYSIS, ROUTINE W REFLEX MICROSCOPIC
Bilirubin Urine: NEGATIVE
Glucose, UA: NEGATIVE mg/dL
Hgb urine dipstick: NEGATIVE
Ketones, ur: NEGATIVE mg/dL
Leukocytes,Ua: NEGATIVE
Nitrite: NEGATIVE
Protein, ur: NEGATIVE mg/dL
Specific Gravity, Urine: 1.027 (ref 1.005–1.030)
pH: 5 (ref 5.0–8.0)

## 2024-07-09 MED ORDER — MORPHINE SULFATE (PF) 4 MG/ML IV SOLN
4.0000 mg | Freq: Once | INTRAVENOUS | Status: AC
  Administered 2024-07-09: 4 mg via INTRAVENOUS
  Filled 2024-07-09: qty 1

## 2024-07-09 MED ORDER — LORAZEPAM 2 MG/ML IJ SOLN
1.0000 mg | Freq: Once | INTRAMUSCULAR | Status: AC
  Administered 2024-07-09: 1 mg via INTRAVENOUS
  Filled 2024-07-09: qty 1

## 2024-07-09 NOTE — ED Notes (Signed)
 [facility] notified for transport by secretary , call Jame multiple times to give report but nobody is answering .

## 2024-07-09 NOTE — ED Notes (Signed)
Transported back to CT scan .

## 2024-07-09 NOTE — ED Notes (Signed)
 Patient transported to CT scan .

## 2024-07-09 NOTE — Discharge Instructions (Addendum)
 You have been evaluated for your fall.  Fortunately your blood work as well as a urine did not show any concerning finding.  CT scan of your head, your cervical spine, and your face fortunately did not show any broken bones or internal injury.  You did suffer bruising and a cut to your lower lip inside your mouth.  This will improve over time.

## 2024-07-09 NOTE — ED Provider Notes (Signed)
 [place] EMERGENCY DEPARTMENT AT [facility] Provider Note   CSN: [id number] Arrival date & time: 07/09/24  2029     Patient presents with: Fall (No Thinners)   Jodi Kappes. is a 73 y.o. male.   The history is provided by the EMS personnel and medical records. No language interpreter was used.     73 year old male with history of Huntington's chorea, prior stroke, GERD, depression, chronic kidney disease, currently is a hospice patient brought here via EMS from nursing facility for evaluation of a fall.  Patient is nonverbal, no available family member for collaborating information at this time.  Per EMS note, patient was found the floor by staff on his right side this evening.  He had an unwitnessed fall.  He is not on any blood thinning medication.  Patient was placed in a c-collar and brought here.  Level 5 caveat due to history of aphasia from prior stroke  Prior to Admission medications   Medication Sig Start Date End Date Taking? Authorizing Provider  acetaminophen  (TYLENOL ) 500 MG tablet Take 500 mg by mouth at bedtime. And q 24 hr prn    [provider]  aspirin  EC 81 MG tablet Take 81 mg by mouth daily.    [provider]  atorvastatin  (LIPITOR) 20 MG tablet Take 1 tablet (20 mg total) by mouth daily at 6 PM. 11/20/14   Rizwan, Saima, MD  barrier cream (NON-SPECIFIED) CREA Apply 1 application topically 2 (two) times daily. Apply to buttocks for redness on buttocks    [provider]  Deutetrabenazine (AUSTEDO) 12 MG TABS Take 12 mg by mouth 2 (two) times daily.    [provider]  Melatonin 5 MG TABS Take 5 mg by mouth at bedtime.     [provider]  sertraline (ZOLOFT) 50 MG tablet Take 50 mg by mouth daily.    [provider]  Vitamin D, Ergocalciferol, (DRISDOL) 50000 units CAPS capsule Take 50,000 Units by mouth every 30 (thirty) days.     [provider]    Allergies: Floxin [ofloxacin] and  Codeine    Review of Systems  Unable to perform ROS: Patient nonverbal    Updated Vital Signs BP 129/81 (BP Location: Left Arm)   Pulse 66   Temp (!) 97.4 F (36.3 C) (Temporal)   Resp 16   SpO2 100%   Physical Exam Constitutional:      General: He is not in acute distress.    Appearance: He is well-developed.     Comments: Chronically ill-appearing male wearing a c-collar and he is nonverbal.  He is contracted.  HENT:     Head: Normocephalic.     Mouth/Throat:     Comments: Bruising noted to inner and outer lower lip no obvious malocclusion. No dental intrusion or extrusion.  Eyes:     Conjunctiva/sclera: Conjunctivae normal.  Neck:     Comments: C-collar in place Cardiovascular:     Rate and Rhythm: Normal rate and regular rhythm.     Pulses: Normal pulses.     Heart sounds: Normal heart sounds.  Pulmonary:     Effort: Pulmonary effort is normal.     Breath sounds: Normal breath sounds.  Abdominal:     Palpations: Abdomen is soft.  Musculoskeletal:     Cervical back: Neck supple.     Comments: No obvious signs of trauma to all 4 extremities, upper extremities are contracted, bilateral foot drops.  Skin warm to the touch  Skin:    Findings: No rash.  Neurological:     Mental Status: He is alert.     (all labs ordered are listed, but only abnormal results are displayed) Labs Reviewed  BASIC METABOLIC PANEL WITH GFR - Abnormal; Notable for the following components:      Result Value   Glucose, Bld 138 (*)    All other components within normal limits  CBC WITH DIFFERENTIAL/PLATELET - Abnormal; Notable for the following components:   Hemoglobin 12.5 (*)    All other components within normal limits  URINALYSIS, ROUTINE W REFLEX MICROSCOPIC    EKG: EKG Interpretation Date/Time:  Sunday July 09 2024 20:52:05 EDT Ventricular Rate:  65 PR Interval:    QRS Duration:  93 QT Interval:  399 QTC Calculation: 415 R Axis:   75  Text Interpretation: Normal sinus  rhythm Interpretation limited secondary to artifact Confirmed by Freddi Hamilton [phone]) on 07/09/2024 10:11:51 PM  Radiology: CT Cervical Spine Wo Contrast Result Date: 07/09/2024 CLINICAL DATA:  Fall. EXAM: CT CERVICAL SPINE WITHOUT CONTRAST TECHNIQUE: Multidetector CT imaging of the cervical spine was performed without intravenous contrast. Multiplanar CT image reconstructions were also generated. RADIATION DOSE REDUCTION: This exam was performed according to the departmental dose-optimization program which includes automated exposure control, adjustment of the mA and/or kV according to patient size and/or use of iterative reconstruction technique. COMPARISON:  None Available. FINDINGS: Alignment: Normal Skull base and vertebrae: No acute fracture. No primary bone lesion or focal pathologic process. Soft tissues and spinal canal: No prevertebral fluid or swelling. No visible canal hematoma. Disc levels: Degenerative disc changes with disc space narrowing and anterior spurring. Moderate bilateral degenerative facet disease. Multilevel bilateral neural foraminal narrowing. Upper chest: No acute findings Other: None IMPRESSION: Cervical spondylosis.  No acute bony abnormality. Electronically Signed   By: Franky Crease M.D.   On: 07/09/2024 22:44   CT Maxillofacial Wo Contrast Result Date: 07/09/2024 CLINICAL DATA:  Polytrauma, penetrating.  Fall. EXAM: CT MAXILLOFACIAL WITHOUT CONTRAST TECHNIQUE: Multidetector CT imaging of the maxillofacial structures was performed. Multiplanar CT image reconstructions were also generated. RADIATION DOSE REDUCTION: This exam was performed according to the departmental dose-optimization program which includes automated exposure control, adjustment of the mA and/or kV according to patient size and/or use of iterative reconstruction technique. COMPARISON:  None Available. FINDINGS: Osseous: No fracture or mandibular dislocation. No destructive process. Orbits: No fracture or  orbital emphysema.  Globes are intact. Sinuses: No air-fluid levels. Soft tissues: Negative Limited intracranial: See head CT report IMPRESSION: No facial or orbital fracture. Electronically Signed   By: Franky Crease M.D.   On: 07/09/2024 22:43   CT Head Wo Contrast Result Date: 07/09/2024 CLINICAL DATA:  Polytrauma, blunt.  Fall. EXAM: CT HEAD WITHOUT CONTRAST TECHNIQUE: Contiguous axial images were obtained from the base of the skull through the vertex without intravenous contrast. RADIATION DOSE REDUCTION: This exam was performed according to the departmental dose-optimization program which includes automated exposure control, adjustment of the mA and/or kV according to patient size and/or use of iterative reconstruction technique. 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 There is atrophy and chronic small vessel disease changes. Associated ventriculomegaly, stable. No acute hemorrhage or infarction. Vascular: No hyperdense vessel or unexpected calcification. Skull: No acute calvarial abnormality. Sinuses/Orbits: No acute findings Other: None IMPRESSION: Atrophy, chronic microvascular disease. No acute intracranial abnormality. 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 This is unchanged since prior study. Electronically Signed   By:  Franky Crease M.D.   On: 07/09/2024 22:40     Procedures   Medications Ordered in the ED  morphine  (PF) 4 MG/ML injection 4 mg (4 mg Intravenous Given 07/09/24 2108)  LORazepam  (ATIVAN ) injection 1 mg (1 mg Intravenous Given 07/09/24 2139)                                    Medical Decision Making Amount and/or Complexity of Data Reviewed Labs: ordered. Radiology: ordered. ECG/medicine tests: ordered.  Risk Prescription drug management.   BP 129/81 (BP Location: Left Arm)   Pulse 66   Temp (!) 97.4 F (36.3 C) (Temporal)   Resp 16   SpO2 100%   72:20 PM 73 year old male with history of  Huntington's chorea, prior stroke, GERD, depression, chronic kidney disease, currently is a hospice patient brought here via EMS from nursing facility for evaluation of a fall.  Patient is nonverbal, no available family member for collaborating information at this time.  Per EMS note, patient was found the floor by staff on his right side this evening.  He had an unwitnessed fall.  He is not on any blood thinning medication.  Patient was placed in a c-collar and brought here.  Level 5 caveat due to history of aphasia from prior stroke  On exam patient is nonverbal.  He is wearing a c-collar.  There is some dried blood noted on the right side of his face and he has signs of injury to his lower inner lip without large laceration requiring suture repair.  No malocclusion.  His upper extremity is contracted.  He has bilateral foot drops.  These are chronic.  Will assess potential infection causing his fall.  Will obtain CT scan of his head, C-spine, and maxillofacial for further assessment.  -Labs ordered, independently viewed and interpreted by me.  Labs remarkable for reassuring labs value.  UA without UTI -The patient was maintained on a cardiac monitor.  I personally viewed and interpreted the cardiac monitored which showed an underlying rhythm of: NSR -Imaging independently viewed and interpreted by me and I agree with radiologist's interpretation.  Result remarkable for CT of head/cspine/maxillofacial without significant sign of injury -This patient presents to the ED for concern of fall, this involves an extensive number of treatment options, and is a complaint that carries with it a high risk of complications and morbidity.  The differential diagnosis includes mechanical fall, stroke, vertigo, fx, dislocation, strain, sprain, contusion, laceration, concussion -Co morbidities that complicate the patient evaluation includes Huntington chorea, stroke, GERD -Treatment includes morphine ,  ativan  -Reevaluation of the patient after these medicines showed that the patient improved -PCP office notes or outside notes reviewed -Discussion with attending Dr. Freddi -Escalation to admission/observation considered: pt to be discharge back to facility.  Attempted to contact family member over the phone without success.       Final diagnoses:  Fall at nursing home, initial encounter  Mouth injury, initial encounter    ED Discharge Orders     None          Nivia Colon, PA-C 07/09/24 2302    Freddi Hamilton, MD 07/12/24 [phone]

## 2024-07-09 NOTE — ED Triage Notes (Addendum)
 Patient arrived with EMS wearing C- collar from [name] , found on the floor by staff on his right side this evening , unwitnessed fall , no anticoagulant medication . No LOC . DNR/Hospice . Non verbal , bilateral arms contracted .
